# Patient Record
Sex: Female | Born: 1965
Health system: Southern US, Community
[De-identification: ages and names within clinical notes are randomized; demographics above are authoritative.]

## PROBLEM LIST (undated history)

## (undated) DIAGNOSIS — F419 Anxiety disorder, unspecified: Secondary | ICD-10-CM

## (undated) DIAGNOSIS — G4733 Obstructive sleep apnea (adult) (pediatric): Secondary | ICD-10-CM

## (undated) DIAGNOSIS — Z87440 Personal history of urinary (tract) infections: Secondary | ICD-10-CM

## (undated) DIAGNOSIS — G43909 Migraine, unspecified, not intractable, without status migrainosus: Secondary | ICD-10-CM

## (undated) DIAGNOSIS — Z803 Family history of malignant neoplasm of breast: Secondary | ICD-10-CM

## (undated) DIAGNOSIS — R109 Unspecified abdominal pain: Secondary | ICD-10-CM

## (undated) DIAGNOSIS — M1990 Unspecified osteoarthritis, unspecified site: Secondary | ICD-10-CM

## (undated) DIAGNOSIS — D229 Melanocytic nevi, unspecified: Secondary | ICD-10-CM

## (undated) DIAGNOSIS — K219 Gastro-esophageal reflux disease without esophagitis: Secondary | ICD-10-CM

## (undated) DIAGNOSIS — T4145XA Adverse effect of unspecified anesthetic, initial encounter: Secondary | ICD-10-CM

## (undated) DIAGNOSIS — Z85820 Personal history of malignant melanoma of skin: Secondary | ICD-10-CM

## (undated) DIAGNOSIS — Z85828 Personal history of other malignant neoplasm of skin: Secondary | ICD-10-CM

## (undated) DIAGNOSIS — Z8719 Personal history of other diseases of the digestive system: Secondary | ICD-10-CM

## (undated) DIAGNOSIS — F32A Depression, unspecified: Secondary | ICD-10-CM

## (undated) DIAGNOSIS — I10 Essential (primary) hypertension: Secondary | ICD-10-CM

## (undated) DIAGNOSIS — T8859XA Other complications of anesthesia, initial encounter: Secondary | ICD-10-CM

## (undated) DIAGNOSIS — E282 Polycystic ovarian syndrome: Secondary | ICD-10-CM

## (undated) DIAGNOSIS — D649 Anemia, unspecified: Secondary | ICD-10-CM

## (undated) DIAGNOSIS — C801 Malignant (primary) neoplasm, unspecified: Secondary | ICD-10-CM

## (undated) DIAGNOSIS — R0600 Dyspnea, unspecified: Secondary | ICD-10-CM

## (undated) DIAGNOSIS — K760 Fatty (change of) liver, not elsewhere classified: Secondary | ICD-10-CM

## (undated) DIAGNOSIS — T7840XA Allergy, unspecified, initial encounter: Secondary | ICD-10-CM

## (undated) HISTORY — PX: LAPAROSCOPIC REPAIR AND REMOVAL OF GASTRIC BAND: SHX5919

## (undated) HISTORY — PX: DILATION AND CURETTAGE OF UTERUS: SHX78

## (undated) HISTORY — DX: Anemia, unspecified: D64.9

## (undated) HISTORY — DX: Migraine, unspecified, not intractable, without status migrainosus: G43.909

## (undated) HISTORY — DX: Melanocytic nevi, unspecified: D22.9

## (undated) HISTORY — DX: Gastro-esophageal reflux disease without esophagitis: K21.9

## (undated) HISTORY — DX: Obstructive sleep apnea (adult) (pediatric): G47.33

## (undated) HISTORY — DX: Personal history of malignant melanoma of skin: Z85.820

## (undated) HISTORY — DX: Essential (primary) hypertension: I10

## (undated) HISTORY — DX: Allergy, unspecified, initial encounter: T78.40XA

## (undated) HISTORY — DX: Family history of malignant neoplasm of breast: Z80.3

## (undated) HISTORY — DX: Personal history of other malignant neoplasm of skin: Z85.828

## (undated) HISTORY — DX: Malignant (primary) neoplasm, unspecified: C80.1

---

## 1989-09-21 DIAGNOSIS — T8859XA Other complications of anesthesia, initial encounter: Secondary | ICD-10-CM

## 1989-09-21 HISTORY — PX: CHOLECYSTECTOMY: SHX55

## 1989-09-21 HISTORY — DX: Other complications of anesthesia, initial encounter: T88.59XA

## 1993-09-21 DIAGNOSIS — F410 Panic disorder [episodic paroxysmal anxiety] without agoraphobia: Secondary | ICD-10-CM

## 1993-09-21 HISTORY — DX: Panic disorder (episodic paroxysmal anxiety): F41.0

## 2001-12-14 ENCOUNTER — Other Ambulatory Visit: Admission: RE | Admit: 2001-12-14 | Discharge: 2001-12-14 | Payer: Self-pay | Admitting: Unknown Physician Specialty

## 2007-03-09 ENCOUNTER — Ambulatory Visit: Payer: Self-pay | Admitting: Cardiology

## 2007-04-25 ENCOUNTER — Ambulatory Visit: Payer: Self-pay | Admitting: Physician Assistant

## 2009-07-04 ENCOUNTER — Encounter: Admission: RE | Admit: 2009-07-04 | Discharge: 2009-07-04 | Payer: Self-pay | Admitting: Internal Medicine

## 2011-02-03 NOTE — Assessment & Plan Note (Signed)
[facility]                          [facility] OFFICE NOTE   NAME:Kimberly Ortega        MRN:          [id number]  DATE:04/25/2007                            DOB:          1965-11-13    PRIMARY CARE PHYSICIAN:  Dr. Doreen Beam.   PRIMARY CARDIOLOGIST:  Dr. Lewayne Bunting.   HISTORY OF PRESENT ILLNESS:  Kimberly Ortega is a 45 year old female  patient who returns to the office today for post hospitalization  followup.  She saw Dr. Andee Lineman on March 10, 2007 secondary to chest pain  and evaluation of bigeminy.  The patient ruled out for myocardial  infarction with enzymes.  She underwent a routine exercise treadmill  test.  She had no evidence of ischemia.  She did have a vagal episode  with associated hypotension, this was felt to be benign.  She also had  an echocardiogram that revealed an EF of 55% - 60%.  No aortic stenosis.  No mitral regurgitation.  She did wear a CardioNet monitor for 21 days.  This returned revealing sinus rhythm, sinus bradycardia, an occasional  episode of sinus tachycardia with a lot of PVCs, bigeminy and trigeminy.   Today she notes she is doing much better.  She is having less chest pain  and palpitations.  We went over her monitor and testing extensively.  She had many questions regarding what could have caused her chest pain.  She does note that her chest pain is improved quite a bit.  Again, it is  nonexertional.  There is really no associated shortness of breath.  She  is able to walk on an elliptical machine without any symptoms.  The day  she went into the hospital she did note some arm pain and jaw pain.  These symptoms are usually followed by a panic attack.  She did note a  lot of indigestion recently.  She was placed on Protonix after discharge  from the hospital.  She was also noted to have microcytic anemia and is  now on iron therapy.  She does note a symptom of transient shooting leg  pain that  is not related to exertion.  She denies any symptoms of  restlessness.   MEDICATIONS:  1. Alprazolam 0.5 mg daily.  2. Protonix 40 mg daily.  3. Poly iron 150 mg b.i.d.  4. Aspirin p.r.n.   PHYSICAL EXAMINATION:  She is a well-nourished, well-developed female in  no acute distress.  Blood pressure is 125/80, pulse 68, weight 246.4  pounds.  HEENT:  Normal.  NECK:  Without JVD.  CARDIAC:  Normal S1-S2, regular rate and rhythm.  LUNGS:  Clear to auscultation bilaterally.  ABDOMEN:  Soft, nontender.  EXTREMITIES:  Trace edema bilaterally.  Calves soft, nontender.  SKIN:  Warm and dry.  NEUROLOGIC:  She is alert and oriented x3, cranial nerves II-XII grossly  intact.  Femoral artery pulses are difficult to palpate bilaterally, no bruits  noted.  Dorsalis pedis and posterior tibialis pulses are 2+ bilaterally.   Electrocardiogram reveals sinus bradycardia with a heart rate of 56, no  acute changes.   IMPRESSION:  1. Palpitations.      a.  Suspect symptomatic premature ventricular contractions.      b.     Premature ventricular contractions, ventricular bigeminy and       ventricular trigeminy noted on recent event monitor.  2. Good left ventricular function.  3. Nonischemic routine exercise treadmill test March 10, 2007.  4. Panic disorder.  5. Gastroesophageal reflux disease.  6. Microcytic anemia.      a.     Suspect secondary to menorrhagia.      b.     Followed by primary care physician.  7. Polycystic ovarian disease, followed by gynecology.  8. Noncardiac chest pain.   PLAN:  Patient presents to the office today for post hospitalization  followup.  Overall she is doing well.  Her symptoms of chest pain seem  to be noncardiac.  I question whether or not they may have been related  to gastroesophageal reflux disease.  Her symptoms have improved on  Protonix therapy.  Her palpitations are fairly quiescent at this time.  We discussed briefly the possibility of going on  beta blocker therapy on  a p.r.n. basis.  She prefers to avoid this if at all possible.  I think  with her blood pressure being normotensive and her heart rate being in  the bradycardic range, this would be best.  She did note some transient  leg pain.  This seems to be nonspecific.  Her pulses are good.  She can  follow up with Dr. Sherril Croon for this.  She can follow up in our office on a  p.r.n. basis.      Tereso Newcomer, PA-C  Electronically Signed      Learta Codding, MD,FACC  Electronically Signed   SW/MedQ  DD: 04/25/2007  DT: 04/25/2007  Job #: [id number]   cc:   Doreen Beam

## 2011-06-18 ENCOUNTER — Encounter: Payer: PRIVATE HEALTH INSURANCE | Attending: Surgery | Admitting: *Deleted

## 2011-06-18 DIAGNOSIS — Z09 Encounter for follow-up examination after completed treatment for conditions other than malignant neoplasm: Secondary | ICD-10-CM | POA: Insufficient documentation

## 2011-06-18 DIAGNOSIS — Z713 Dietary counseling and surveillance: Secondary | ICD-10-CM | POA: Insufficient documentation

## 2011-06-18 DIAGNOSIS — Z9884 Bariatric surgery status: Secondary | ICD-10-CM | POA: Insufficient documentation

## 2011-06-18 NOTE — Progress Notes (Signed)
  Pre-Operative Nutrition Class  Patient was seen on 06/18/2011 from 5:00pm to 6:00pm for Pre-Operative Nutrition education at [facility].   Surgery date: 07/07/11 Surgery type: LAGB  Weight today: 271.5 lbs Weight change: 28.1 lbs gain BMI: 43.9%  Samples given per MNT protocol: Bariatric Advantage Multivitamin Lot # [id number] Exp: 12/12  Bariatric Advantage Calcium Citrate Lot # [id number] Exp: 12/13  Celebrate Vitamins Multivitamin Lot # [id number] Exp: 9/13  Celebrate VitaminsCalcium Citrate Lot # [id number] Exp: 4/13  Unjury Protein Lot # [id number] Exp:1/14  The following the learning objective met by the patient during this course:   Identifies Pre-Op Dietary Goals and will begin 2 weeks pre-operatively   Identifies appropriate sources of fluids and proteins   States protein recommendations and appropriate sources pre and post-operatively  Identifies Post-Operative Dietary Goals and will follow for 2 weeks post-operatively  Identifies appropriate multivitamin and calcium sources  Describes the need for physical activity post-operatively and will follow MD recommendations  States when to call healthcare provider regarding medication questions or post-operative complications  Handouts given during class include:  Pre-Op Bariatric Surgery Diet Handout  Protein Shake Handout  Post-Op Bariatric Surgery Nutrition Handout  BELT Program Information Flyer  Support Group Information Flyer  Follow-Up Plan: Patient will follow-up at [facility] 2 weeks post operatively for diet advancement per MD.

## 2011-06-18 NOTE — Patient Instructions (Signed)
Follow:    Pre-Op Diet per MD 2 weeks prior to surgery  Phase 2- Liquids (clear/full) 2 weeks after surgery  Vitamin/Mineral/Calcium guidelines for purchasing bariatric supplements  Exercise guidelines pre and post-op per MD  Follow-up at [facility] in 2 weeks post-op for diet advancement. Contact Keltie Labell as needed with questions/concerns.  

## 2011-07-02 ENCOUNTER — Encounter (INDEPENDENT_AMBULATORY_CARE_PROVIDER_SITE_OTHER): Payer: Self-pay | Admitting: Surgery

## 2011-07-02 ENCOUNTER — Ambulatory Visit (INDEPENDENT_AMBULATORY_CARE_PROVIDER_SITE_OTHER): Payer: PRIVATE HEALTH INSURANCE | Admitting: Surgery

## 2011-07-02 VITALS — BP 144/90 | HR 62 | Temp 96.9°F | Resp 14 | Ht 66.75 in | Wt 262.2 lb

## 2011-07-02 NOTE — Progress Notes (Signed)
Subjective:     Patient ID: Kimberly Ortega, female   DOB: [birth year], 45 y.o.   MRN: [id number]  HPI Kimberly Ortega and her husband came in today for a preoperative visit for a LAP-BAND placement this coming Tuesday. Kimberly Ortega is 54 sutures old and has been through our program seeing me initially January 2011. Her comorbidities included hypertension, obstructive sleep apnea, and mild degenerative joint disease. She does have significant gastroesophageal reflux and had an upper GI at [facility] and has an endoscopy which have looked at the pictures showing a small hiatal hernia. I discussed repair with her.  Her husband is a Teacher, early years/pre and I wrote her scripts for her Roxicet and Zofran ODT 4 mg since they will fill them in [place].   Past history she has no history of DVT. Current Outpatient Prescriptions  Medication Sig Dispense Refill  . ALPRAZolam (XANAX) 1 MG tablet Take 1 mg by mouth at bedtime as needed.        Marland Kitchen omeprazole (PRILOSEC) 20 MG capsule Take 20 mg by mouth daily.         Past Medical History  Diagnosis Date  . GERD (gastroesophageal reflux disease)   . Sleep apnea   . Anemia   . Cancer     melanoma  . Generalized headaches   . Family history of breast cancer     cousins on both sides of family   Past Surgical History  Procedure Date  . Cholecystectomy 1991   Allergies as of 07/02/2011  . (No Known Allergies)  I have discussed lab and surgery with her. She is aware of the risk and potential complications. I've discussed hiatal hernia repair with him and she understands how this is done.   Review of Systems  Constitutional: Negative.   HENT: Negative.   Eyes: Negative.   Respiratory: Negative.   Cardiovascular: Negative.   Gastrointestinal: Negative.   Genitourinary: Negative.   Musculoskeletal: Positive for myalgias.       Occasional thigh pain  Skin: Negative.   Psychiatric/Behavioral:       Panic disorder    Filed  Vitals:   07/02/11 0947  BP: 144/90  Pulse: 62  Temp: 96.9 F (36.1 C)  Resp: 14      Objective:   Physical Exam  Constitutional: She is oriented to person, place, and time. She appears well-developed and well-nourished.  HENT:  Head: Normocephalic and atraumatic.  Eyes: Pupils are equal, round, and reactive to light.  Neck: Normal range of motion. Neck supple.  Cardiovascular: Normal rate, regular rhythm and normal heart sounds.   Pulmonary/Chest: Effort normal and breath sounds normal.  Abdominal: Soft. Bowel sounds are normal.  Musculoskeletal: Normal range of motion.  Neurological: She is alert and oriented to person, place, and time.  Skin: Skin is warm and dry.  Psychiatric: She has a normal mood and affect. Her behavior is normal. Judgment and thought content normal.       Assessment      Morbid obesity, BMI 42 with GERD    Plan:     Lapband and hiatus hernia repair

## 2011-07-03 ENCOUNTER — Other Ambulatory Visit (INDEPENDENT_AMBULATORY_CARE_PROVIDER_SITE_OTHER): Payer: Self-pay | Admitting: Surgery

## 2011-07-03 ENCOUNTER — Ambulatory Visit (HOSPITAL_COMMUNITY)
Admission: RE | Admit: 2011-07-03 | Discharge: 2011-07-03 | Disposition: A | Discharge status: Home / Self Care | Payer: PRIVATE HEALTH INSURANCE | Source: Ambulatory Visit | Attending: Surgery | Admitting: Surgery

## 2011-07-03 ENCOUNTER — Other Ambulatory Visit: Payer: Self-pay | Admitting: Anesthesiology

## 2011-07-03 ENCOUNTER — Encounter (HOSPITAL_COMMUNITY): Payer: PRIVATE HEALTH INSURANCE

## 2011-07-03 DIAGNOSIS — G4730 Sleep apnea, unspecified: Secondary | ICD-10-CM | POA: Insufficient documentation

## 2011-07-03 DIAGNOSIS — Z01811 Encounter for preprocedural respiratory examination: Secondary | ICD-10-CM

## 2011-07-03 DIAGNOSIS — D649 Anemia, unspecified: Secondary | ICD-10-CM | POA: Insufficient documentation

## 2011-07-03 DIAGNOSIS — Z01818 Encounter for other preprocedural examination: Secondary | ICD-10-CM | POA: Insufficient documentation

## 2011-07-03 DIAGNOSIS — Z01812 Encounter for preprocedural laboratory examination: Secondary | ICD-10-CM | POA: Insufficient documentation

## 2011-07-03 DIAGNOSIS — Z01810 Encounter for preprocedural cardiovascular examination: Secondary | ICD-10-CM | POA: Insufficient documentation

## 2011-07-03 LAB — DIFFERENTIAL
Basophils Absolute: 0.1 10*3/uL (ref 0.0–0.1)
Eosinophils Absolute: 0.2 10*3/uL (ref 0.0–0.7)
Lymphocytes Relative: 26 % (ref 12–46)
Monocytes Absolute: 0.4 10*3/uL (ref 0.1–1.0)
Neutro Abs: 5.1 10*3/uL (ref 1.7–7.7)
Neutrophils Relative %: 66 % (ref 43–77)

## 2011-07-03 LAB — COMPREHENSIVE METABOLIC PANEL
BUN: 16 mg/dL (ref 6–23)
CO2: 28 mEq/L (ref 19–32)
Chloride: 100 mEq/L (ref 96–112)
Creatinine, Ser: 0.8 mg/dL (ref 0.50–1.10)
GFR calc non Af Amer: 87 mL/min — ABNORMAL LOW (ref >90)
Total Bilirubin: 0.1 mg/dL — ABNORMAL LOW (ref 0.3–1.2)

## 2011-07-03 LAB — CBC
HCT: 31.5 % — ABNORMAL LOW (ref 36.0–46.0)
Hemoglobin: 9.3 g/dL — ABNORMAL LOW (ref 12.0–15.0)
MCH: 20.1 pg — ABNORMAL LOW (ref 26.0–34.0)
MCV: 68 fL — ABNORMAL LOW (ref 78.0–100.0)
RBC: 4.63 MIL/uL (ref 3.87–5.11)

## 2011-07-03 LAB — SURGICAL PCR SCREEN
MRSA, PCR: NEGATIVE
Staphylococcus aureus: POSITIVE — AB

## 2011-07-07 ENCOUNTER — Ambulatory Visit (HOSPITAL_COMMUNITY)
Admission: RE | Admit: 2011-07-07 | Discharge: 2011-07-08 | Disposition: A | Discharge status: Home / Self Care | Payer: PRIVATE HEALTH INSURANCE | Source: Ambulatory Visit | Attending: Surgery | Admitting: Surgery

## 2011-07-07 DIAGNOSIS — Z79899 Other long term (current) drug therapy: Secondary | ICD-10-CM | POA: Insufficient documentation

## 2011-07-07 DIAGNOSIS — Z6841 Body Mass Index (BMI) 40.0 and over, adult: Secondary | ICD-10-CM | POA: Insufficient documentation

## 2011-07-07 DIAGNOSIS — K449 Diaphragmatic hernia without obstruction or gangrene: Secondary | ICD-10-CM | POA: Insufficient documentation

## 2011-07-07 DIAGNOSIS — G4733 Obstructive sleep apnea (adult) (pediatric): Secondary | ICD-10-CM | POA: Insufficient documentation

## 2011-07-07 DIAGNOSIS — K210 Gastro-esophageal reflux disease with esophagitis: Secondary | ICD-10-CM

## 2011-07-07 DIAGNOSIS — K219 Gastro-esophageal reflux disease without esophagitis: Secondary | ICD-10-CM | POA: Insufficient documentation

## 2011-07-07 HISTORY — PX: LAPAROSCOPIC GASTRIC BANDING: SHX1100

## 2011-07-08 ENCOUNTER — Ambulatory Visit (HOSPITAL_COMMUNITY): Payer: PRIVATE HEALTH INSURANCE

## 2011-07-08 LAB — DIFFERENTIAL
Basophils Relative: 1 % (ref 0–1)
Eosinophils Relative: 1 % (ref 0–5)
Monocytes Relative: 7 % (ref 3–12)
Neutrophils Relative %: 67 % (ref 43–77)

## 2011-07-08 LAB — CBC
HCT: 29.1 % — ABNORMAL LOW (ref 36.0–46.0)
Hemoglobin: 8.4 g/dL — ABNORMAL LOW (ref 12.0–15.0)
MCV: 69.5 fL — ABNORMAL LOW (ref 78.0–100.0)
WBC: 10.9 10*3/uL — ABNORMAL HIGH (ref 4.0–10.5)

## 2011-07-08 MED ORDER — IOHEXOL 300 MG/ML  SOLN
20.0000 mL | Freq: Once | INTRAMUSCULAR | Status: AC | PRN
  Administered 2011-07-08: 20 mL via ORAL

## 2011-07-15 NOTE — Op Note (Signed)
  NAME:  Kimberly Ortega, Kimberly Ortega NO.:  [id number]  MEDICAL RECORD NO.:  [id number]  LOCATION:  1524                         FACILITY:  [facility]  PHYSICIAN:  Thornton Park. Daphine Deutscher, MD  DATE OF BIRTH:  [birth year]  DATE OF PROCEDURE:  07/07/2011 DATE OF DISCHARGE:                              OPERATIVE REPORT   PREOPERATIVE DIAGNOSIS:  BMI 41 with gastroesophageal reflux disease.  PROCEDURE:  Laparoscopic adjustable gastric banding APS with 2-suture posterior hiatal hernia repair following balloon calibration, which was positive; APS band plicated using a SuperStitch on the left lateral side followed by 3 regular stitches, followed by an anti-slip stitch.  SURGEON:  Thornton Park. Daphine Deutscher, MD  ASSISTANT:  Lodema Pilot, MD  ANESTHESIA:  General endotracheal.  DESCRIPTION OF PROCEDURE:  This 45 year old white female from [place], [place], comes down for lap band.  She was taken to room 1 on Tuesday, July 07, 2011, and given general anesthesia.  The abdomen was prepped with PCMX and draped sterilely.  Access to the abdomen was achieved through the left upper quadrant using a 0 degree 10 mm to 11 mm 0 Optiview technique.  This was done without difficulty and the abdomen was insufflated.  5 was placed laterally, another 10-11 was placed to the left of the umbilicus for the camera, and then a 15 and another 10- 11 were placed on the right side.  5 mm used in the upper midline for the [facility] retractor, which provided visualization of the foregut. First, we did a balloon test calibration, which was positive with 10 cc of air.  We knew, she had a hiatal hernia by endoscopy up in [place].  I then dissected her posteriorly, elevating the esophagus finding the left crus and then putting 2 simple sutures held in place with tie knots posteriorly.  The balloon test was then negative and held under abdomen.  I then created a trajectory with above the band passer, which went well. An APS  band was chosen and introduced and brought around.  It was engaged and buckled.  It was held down and then plicated using the SuperStitch laterally with a very lateral area tacked to the left crus and then 3 sutures plicating over to the proximal stomach and then in any slip stitch medially.  Picture was taken of the band.  The tubing was brought out through the lower port on the right.  It was attached to the port, which had mesh placed on the back and it was implanted in the subcutaneous location.  Wounds were irrigated and injected with Marcaine and closed with 4-0 Vicryl, benzoin and Steri-Strips.  The patient tolerated the procedure well and was taken to recovery room in satisfactory condition.     Thornton Park Daphine Deutscher, MD     MBM/MEDQ  D:  07/07/2011  T:  07/08/2011  Job:  [id number]  cc:   Dr. Sherril Croon  Electronically Signed by [name] MD on 07/15/2011 05:00:31 PM

## 2011-07-15 NOTE — Discharge Summary (Signed)
  NAME:  Kimberly Ortega, TOTTY NO.:  [id number]  MEDICAL RECORD NO.:  [id number]  LOCATION:  1524                         FACILITY:  [facility]  PHYSICIAN:  Thornton Park. Daphine Deutscher, MD  DATE OF BIRTH:  [birth year]  DATE OF ADMISSION:  07/07/2011 DATE OF DISCHARGE:  07/08/2011                              DISCHARGE SUMMARY   ADMITTING DIAGNOSES:  Hiatal hernia and morbid obesity.  PROCEDURES:  Laparoscopic gastric banding, Allergan APS system, with 2 suture posterior repair of her hiatal hernia.  COURSE IN THE HOSPITAL:  [name] had the above-mentioned procedure on the day of admission.  Postoperatively, she had a swallow X-rays and was tolerating a diet prior to discharge.  She already had a script for Roxicet and Zofran at home and she will be followed up in the office in 2 weeks.  CONDITION:  Good.     Thornton Park Daphine Deutscher, MD     MBM/MEDQ  D:  07/08/2011  T:  07/08/2011  Job:  [id number]  Electronically Signed by [name] MD on 07/15/2011 05:00:26 PM

## 2011-07-21 ENCOUNTER — Encounter: Payer: Self-pay | Admitting: *Deleted

## 2011-07-21 ENCOUNTER — Ambulatory Visit: Payer: PRIVATE HEALTH INSURANCE

## 2011-07-21 NOTE — Progress Notes (Deleted)
  Bariatric Class:  Appt start time: 1600 end time:  {Time; Appointment:21385}.  2 Week Post-Operative Nutrition Class  Patient was seen on 07/21/2011 for Post-Operative Nutrition education at the [facility].   Surgery date: 07/07/11  Surgery type: LAGB   Weight today: *** Weight change: *** Total weight lost: *** BMI: ***  Weight today: 271.5 lbs  Weight change: 28.1 lbs gain  BMI: 43.9%  The following the learning objective met the patient during this course:   Identifies Phase 3A (Soft, High Proteins) Dietary Goals and will begin from 2 weeks post-operatively to 2 months post-operatively   Identifies appropriate sources of fluids and proteins   States protein recommendations and appropriate sources post-operatively  Identifies the need for appropriate texture modifications, mastication, and bite sizes when consuming solids  Identifies appropriate multivitamin and calcium sources post-operatively  Describes the need for physical activity post-operatively and will follow MD recommendations  States when to call healthcare provider regarding medication questions or post-operative complications  Handouts given during class include:  Phase 3A: Soft, High Protein Diet Handout  Band Fill Guidelines Handout  Follow-Up Plan: Patient will follow-up at [facility] in 6 weeks for 2 months post-op nutrition visit for diet advancement per MD.

## 2011-07-23 ENCOUNTER — Encounter (INDEPENDENT_AMBULATORY_CARE_PROVIDER_SITE_OTHER): Payer: Self-pay | Admitting: Surgery

## 2011-07-23 ENCOUNTER — Ambulatory Visit (INDEPENDENT_AMBULATORY_CARE_PROVIDER_SITE_OTHER): Payer: PRIVATE HEALTH INSURANCE | Admitting: Surgery

## 2011-07-23 VITALS — BP 136/88 | HR 68 | Temp 97.6°F | Resp 16 | Ht 66.75 in | Wt 243.8 lb

## 2011-07-23 DIAGNOSIS — Z9884 Bariatric surgery status: Secondary | ICD-10-CM

## 2011-07-23 DIAGNOSIS — D649 Anemia, unspecified: Secondary | ICD-10-CM | POA: Insufficient documentation

## 2011-07-23 NOTE — Progress Notes (Signed)
Addended by: Latricia Heft on: 07/23/2011 11:08 AM   Modules accepted: Orders

## 2011-07-23 NOTE — Patient Instructions (Signed)
Call to check HG.  If low we will order iron infusion.

## 2011-07-23 NOTE — Progress Notes (Signed)
Ms Kimberly Ortega Returns today and she is 2 weeks out from her lap band A PL  With 2 suture closure of her hiatus. Her burning pain and reflux symptoms are gone but she still says she has a bad taste in her mouth. I think this is probably all the chemicals and stuff causing her to have irregularities. She doesn't have reflux. Her hemoglobin preoperatively and dropped to around 9.3 in and subsequently 0.4. She is complaining of being tired. I'm a check a CBC on her today it is down we may order an iron infusion. Otherwise see her back in 4 weeks for her first Panafil.

## 2011-07-27 ENCOUNTER — Other Ambulatory Visit (INDEPENDENT_AMBULATORY_CARE_PROVIDER_SITE_OTHER): Payer: Self-pay | Admitting: Surgery

## 2011-07-28 ENCOUNTER — Ambulatory Visit: Payer: PRIVATE HEALTH INSURANCE

## 2011-07-28 ENCOUNTER — Telehealth: Payer: Self-pay | Admitting: *Deleted

## 2011-07-28 LAB — CBC
MCH: 19.2 pg — ABNORMAL LOW (ref 26.0–34.0)
MCHC: 28 g/dL — ABNORMAL LOW (ref 30.0–36.0)
Platelets: 487 10*3/uL — ABNORMAL HIGH (ref 150–400)
RDW: 17.5 % — ABNORMAL HIGH (ref 11.5–15.5)

## 2011-07-28 NOTE — Telephone Encounter (Signed)
Kimberly Ortega called today to report that she would not be attending her 2-Week Post-Op Nutrition Class today due to feeling extremely fatigued and tired. She reports that she recently found out per Dr. Daphine Deutscher that her Hgb levels were extremely low and that she is very anemic. She ntoes that she is waiting to hear back on whether or not she will have an iron infusion.   She stated that Dr. Daphine Deutscher told her to begin eating soft proteins such as a hard boiled egg.  Emailed patient Phase 3A - Soft, High Protein Foods and reviewed handout with her. She is to call [facility] (in the next several days) once her energy level improves to follow up on her labs and dietary intake.  Reviewed Goals during phone call including:  Follow Phase 3A: Soft High Protein Phase  Eat 3-6 small meals/snacks, every 3-5 hrs  Increase lean protein foods to meet 60g goal  Increase fluid intake to 64oz +  Avoid drinking 15 minutes before, during and 30 minutes after eating  Aim for >30 min of physical activity daily per MD

## 2011-07-31 ENCOUNTER — Telehealth (INDEPENDENT_AMBULATORY_CARE_PROVIDER_SITE_OTHER): Payer: Self-pay | Admitting: General Surgery

## 2011-07-31 NOTE — Telephone Encounter (Signed)
Pt called in asking to speak with French Ana. Pt stated she had blood work done by [employer] on 07/27/11 and wanted to know the results. Pt stated her iron was low and to have an infusion. Pt stated she can be called back on [phone].

## 2011-07-31 NOTE — Telephone Encounter (Signed)
Contacted patient to advise her that Dr Donell Beers viewed her lab work 07/30/11 and stated she did not need to have a transfusion.

## 2011-08-10 ENCOUNTER — Encounter (INDEPENDENT_AMBULATORY_CARE_PROVIDER_SITE_OTHER): Payer: Self-pay

## 2011-08-26 ENCOUNTER — Encounter (INDEPENDENT_AMBULATORY_CARE_PROVIDER_SITE_OTHER): Payer: Self-pay | Admitting: Surgery

## 2011-08-26 NOTE — Progress Notes (Signed)
Quick Note:  When do we see her back in office ______

## 2011-09-02 ENCOUNTER — Encounter (INDEPENDENT_AMBULATORY_CARE_PROVIDER_SITE_OTHER): Payer: Self-pay | Admitting: Surgery

## 2011-09-02 ENCOUNTER — Ambulatory Visit (INDEPENDENT_AMBULATORY_CARE_PROVIDER_SITE_OTHER): Payer: PRIVATE HEALTH INSURANCE | Admitting: Surgery

## 2011-09-02 VITALS — BP 134/92 | HR 60 | Temp 98.0°F | Resp 18 | Ht 66.75 in | Wt 234.5 lb

## 2011-09-02 DIAGNOSIS — Z9884 Bariatric surgery status: Secondary | ICD-10-CM

## 2011-09-02 NOTE — Progress Notes (Signed)
Today's visit is 57 days postop and her weight is down by 27.4 pounds. She is feeling some restriction. I did I first fell today and added 0.5 cc to her band. I'll see her back in 6 weeks.

## 2011-09-02 NOTE — Patient Instructions (Signed)

## 2011-09-04 ENCOUNTER — Encounter (INDEPENDENT_AMBULATORY_CARE_PROVIDER_SITE_OTHER): Payer: PRIVATE HEALTH INSURANCE

## 2011-10-16 ENCOUNTER — Encounter (INDEPENDENT_AMBULATORY_CARE_PROVIDER_SITE_OTHER): Payer: PRIVATE HEALTH INSURANCE | Admitting: Surgery

## 2011-10-22 ENCOUNTER — Encounter (INDEPENDENT_AMBULATORY_CARE_PROVIDER_SITE_OTHER): Payer: Self-pay | Admitting: Surgery

## 2011-10-22 ENCOUNTER — Ambulatory Visit (INDEPENDENT_AMBULATORY_CARE_PROVIDER_SITE_OTHER): Payer: PRIVATE HEALTH INSURANCE | Admitting: Surgery

## 2011-10-22 VITALS — BP 122/84 | HR 72 | Temp 98.4°F | Resp 16 | Ht 66.75 in | Wt 230.2 lb

## 2011-10-22 DIAGNOSIS — Z9884 Bariatric surgery status: Secondary | ICD-10-CM

## 2011-10-22 NOTE — Progress Notes (Signed)
PREOPERATIVE DIAGNOSIS: BMI 41 with gastroesophageal reflux disease.  PROCEDURE: Laparoscopic adjustable gastric banding APS with 2-suture  posterior hiatal hernia repair following balloon calibration, which was  positive; APS band plicated using a SuperStitch on the left lateral side  followed by 3 regular stitches, followed by an anti-slip stitch.  SURGEON: Thornton Park. Daphine Deutscher, MD  ASSISTANT: Lodema Pilot, MD  Debria Garret Amos [name] Body mass index is 36.32 kg/(m^2).  Having regurgitation:  no  Nocturnal reflux?  no  Amount of fill  0.75  Doing well.  Losing weight.  Will see back in 4 weeks

## 2011-11-19 ENCOUNTER — Ambulatory Visit (INDEPENDENT_AMBULATORY_CARE_PROVIDER_SITE_OTHER): Payer: PRIVATE HEALTH INSURANCE | Admitting: Surgery

## 2011-11-19 DIAGNOSIS — Z9884 Bariatric surgery status: Secondary | ICD-10-CM

## 2011-11-19 NOTE — Progress Notes (Signed)
Kimberly Ortega There is no height or weight on file to calculate BMI.  Having regurgitation:  no  Nocturnal reflux?  no  Amount of fill  0.5  I did a dynamic fill.  Added 0.5 cc.  Still not to Green zone.  No GER since surgery.  No charge until I have gotten her in the [place] Zone

## 2012-01-01 ENCOUNTER — Ambulatory Visit (INDEPENDENT_AMBULATORY_CARE_PROVIDER_SITE_OTHER): Payer: PRIVATE HEALTH INSURANCE | Admitting: Surgery

## 2012-01-01 ENCOUNTER — Encounter (INDEPENDENT_AMBULATORY_CARE_PROVIDER_SITE_OTHER): Payer: Self-pay | Admitting: Surgery

## 2012-01-01 VITALS — BP 132/72 | Ht 63.75 in | Wt 225.2 lb

## 2012-01-01 DIAGNOSIS — Z9884 Bariatric surgery status: Secondary | ICD-10-CM

## 2012-01-01 NOTE — Progress Notes (Signed)
Kimberly Ortega Body mass index is 38.96 kg/(m^2).  Having regurgitation:  no  Nocturnal reflux?  no  Amount of fill  0.4

## 2012-02-04 ENCOUNTER — Encounter (INDEPENDENT_AMBULATORY_CARE_PROVIDER_SITE_OTHER): Payer: Self-pay | Admitting: Surgery

## 2012-02-04 ENCOUNTER — Telehealth (INDEPENDENT_AMBULATORY_CARE_PROVIDER_SITE_OTHER): Payer: Self-pay | Admitting: Surgery

## 2012-02-05 ENCOUNTER — Encounter (INDEPENDENT_AMBULATORY_CARE_PROVIDER_SITE_OTHER): Payer: PRIVATE HEALTH INSURANCE | Admitting: Surgery

## 2012-02-10 ENCOUNTER — Encounter (INDEPENDENT_AMBULATORY_CARE_PROVIDER_SITE_OTHER): Payer: PRIVATE HEALTH INSURANCE | Admitting: Surgery

## 2012-02-10 ENCOUNTER — Telehealth (INDEPENDENT_AMBULATORY_CARE_PROVIDER_SITE_OTHER): Payer: Self-pay | Admitting: General Surgery

## 2012-02-19 ENCOUNTER — Ambulatory Visit (INDEPENDENT_AMBULATORY_CARE_PROVIDER_SITE_OTHER): Payer: PRIVATE HEALTH INSURANCE | Admitting: Surgery

## 2012-02-19 ENCOUNTER — Encounter (INDEPENDENT_AMBULATORY_CARE_PROVIDER_SITE_OTHER): Payer: Self-pay | Admitting: Surgery

## 2012-02-19 VITALS — BP 126/78 | HR 70 | Temp 97.4°F | Resp 14 | Ht 66.75 in | Wt 225.2 lb

## 2012-02-19 DIAGNOSIS — Z9884 Bariatric surgery status: Secondary | ICD-10-CM

## 2012-02-19 NOTE — Progress Notes (Signed)
Kimberly Ortega Body mass index is 35.54 kg/(m^2).  Having regurgitation:  no  Nocturnal reflux?  no  Amount of fill  1 cc  Frustrated.  No GER.  Will see back in 6 weeks

## 2012-02-19 NOTE — Patient Instructions (Signed)

## 2012-02-24 ENCOUNTER — Encounter (INDEPENDENT_AMBULATORY_CARE_PROVIDER_SITE_OTHER): Payer: PRIVATE HEALTH INSURANCE | Admitting: Surgery

## 2012-03-25 ENCOUNTER — Encounter (INDEPENDENT_AMBULATORY_CARE_PROVIDER_SITE_OTHER): Payer: Self-pay | Admitting: Surgery

## 2012-03-25 ENCOUNTER — Ambulatory Visit (INDEPENDENT_AMBULATORY_CARE_PROVIDER_SITE_OTHER): Payer: PRIVATE HEALTH INSURANCE | Admitting: Surgery

## 2012-03-25 VITALS — BP 120/80 | Ht 66.75 in | Wt 218.6 lb

## 2012-03-25 DIAGNOSIS — Z9884 Bariatric surgery status: Secondary | ICD-10-CM

## 2012-03-25 NOTE — Progress Notes (Signed)
Kimberly Ortega Body mass index is 34.49 kg/(m^2).  Having regurgitation:  no  Nocturnal reflux?  no  Amount of fill  0.5  She is trying to get her scheduled straightforward daughter stance palpitations. She has some goals set and has lost down to 218 since her last flow back of the end of May. Today's weight is 218 and added at 0.5 cc to her band. Let us see her back in approximately 5 weeks. She says she is not quite in the green zone yet. I told her about getting her there.

## 2012-03-25 NOTE — Patient Instructions (Signed)

## 2012-05-03 ENCOUNTER — Encounter (INDEPENDENT_AMBULATORY_CARE_PROVIDER_SITE_OTHER): Payer: Self-pay | Admitting: Surgery

## 2012-05-03 ENCOUNTER — Ambulatory Visit (INDEPENDENT_AMBULATORY_CARE_PROVIDER_SITE_OTHER): Payer: PRIVATE HEALTH INSURANCE | Admitting: Surgery

## 2012-05-03 VITALS — BP 123/78 | HR 78 | Temp 98.8°F | Resp 16 | Ht 66.0 in | Wt 217.2 lb

## 2012-05-03 DIAGNOSIS — Z9884 Bariatric surgery status: Secondary | ICD-10-CM

## 2012-05-03 NOTE — Progress Notes (Signed)
Kimberly Ortega Body mass index is 35.06 kg/(m^2).  Having regurgitation:  no  Nocturnal reflux?  no  Amount of fill  +0.3  She is heading for [place] tomorrow.  Will see her back in 3 weeks to assess restriction.  If unable to reach restriction may need to check for leakage.

## 2012-05-25 ENCOUNTER — Encounter (INDEPENDENT_AMBULATORY_CARE_PROVIDER_SITE_OTHER): Payer: PRIVATE HEALTH INSURANCE | Admitting: Surgery

## 2012-05-26 ENCOUNTER — Encounter (INDEPENDENT_AMBULATORY_CARE_PROVIDER_SITE_OTHER): Payer: PRIVATE HEALTH INSURANCE | Admitting: Surgery

## 2012-05-30 ENCOUNTER — Ambulatory Visit (INDEPENDENT_AMBULATORY_CARE_PROVIDER_SITE_OTHER): Payer: PRIVATE HEALTH INSURANCE | Admitting: Surgery

## 2012-05-30 ENCOUNTER — Encounter (INDEPENDENT_AMBULATORY_CARE_PROVIDER_SITE_OTHER): Payer: Self-pay | Admitting: Surgery

## 2012-05-30 VITALS — BP 124/84 | HR 64 | Resp 12 | Ht 66.7 in | Wt 218.0 lb

## 2012-05-30 DIAGNOSIS — Z9884 Bariatric surgery status: Secondary | ICD-10-CM

## 2012-05-30 NOTE — Progress Notes (Signed)
Kimberly Ortega Body mass index is 34.45 kg/(m^2).  Having regurgitation:  no  Nocturnal reflux?  no  Amount of fill  0.5  Frustrated because of slow weight loss.  Encouraged to stay on low carbs and continue exercise.  Will see in 4 weeks.

## 2012-05-30 NOTE — Patient Instructions (Addendum)

## 2012-07-05 ENCOUNTER — Telehealth (INDEPENDENT_AMBULATORY_CARE_PROVIDER_SITE_OTHER): Payer: Self-pay | Admitting: General Surgery

## 2012-07-05 NOTE — Telephone Encounter (Signed)
LMOM letting pt know that I am having to reschedule her appt to see Dr. Daphine Deutscher from 10/18 to 10/24 at 10:30.  I also sent a reminder card in the mail.

## 2012-07-06 ENCOUNTER — Encounter (INDEPENDENT_AMBULATORY_CARE_PROVIDER_SITE_OTHER): Payer: PRIVATE HEALTH INSURANCE | Admitting: Surgery

## 2012-07-08 ENCOUNTER — Encounter (INDEPENDENT_AMBULATORY_CARE_PROVIDER_SITE_OTHER): Payer: PRIVATE HEALTH INSURANCE | Admitting: Surgery

## 2012-07-14 ENCOUNTER — Encounter (INDEPENDENT_AMBULATORY_CARE_PROVIDER_SITE_OTHER): Payer: PRIVATE HEALTH INSURANCE | Admitting: Surgery

## 2012-07-22 ENCOUNTER — Ambulatory Visit (INDEPENDENT_AMBULATORY_CARE_PROVIDER_SITE_OTHER): Payer: PRIVATE HEALTH INSURANCE | Admitting: Surgery

## 2012-07-22 ENCOUNTER — Encounter (INDEPENDENT_AMBULATORY_CARE_PROVIDER_SITE_OTHER): Payer: Self-pay | Admitting: Surgery

## 2012-07-22 VITALS — BP 122/78 | HR 64 | Temp 98.5°F | Resp 16 | Ht 66.75 in | Wt 209.2 lb

## 2012-07-22 DIAGNOSIS — Z9884 Bariatric surgery status: Secondary | ICD-10-CM

## 2012-07-22 NOTE — Progress Notes (Signed)
Kimberly Ortega 46 y.o.  Body mass index is 33.01 kg/(m^2).  Patient Active Problem List  Diagnosis  . Anemia  . Lapband APS with [facility] repair Oct 2012    No Known Allergies  Past Surgical History  Procedure Date  . Cholecystectomy 1991  . Laparoscopic gastric banding 07/07/11   VYAS,DHRUV B., MD 1. Lapband APS with [facility] repair Oct 2012     Doing well.  Has lost 8 lbs since last visit.  Added 0.5 cc today.  I think that she is nearing green zone with her APL band. Susy Frizzle B. Daphine Deutscher, MD, [facility] [phone] beeper [phone]  07/22/2012 3:22 PM  .

## 2012-07-22 NOTE — Patient Instructions (Signed)

## 2012-09-02 ENCOUNTER — Encounter (INDEPENDENT_AMBULATORY_CARE_PROVIDER_SITE_OTHER): Payer: PRIVATE HEALTH INSURANCE | Admitting: Surgery

## 2012-09-09 ENCOUNTER — Encounter (INDEPENDENT_AMBULATORY_CARE_PROVIDER_SITE_OTHER): Payer: Self-pay | Admitting: Surgery

## 2012-10-03 ENCOUNTER — Telehealth (INDEPENDENT_AMBULATORY_CARE_PROVIDER_SITE_OTHER): Payer: Self-pay | Admitting: General Surgery

## 2012-10-03 ENCOUNTER — Other Ambulatory Visit (INDEPENDENT_AMBULATORY_CARE_PROVIDER_SITE_OTHER): Payer: Self-pay

## 2012-10-03 DIAGNOSIS — R52 Pain, unspecified: Secondary | ICD-10-CM

## 2012-10-03 NOTE — Telephone Encounter (Signed)
CT scan set up for 10/04/12 @ 9:00 am with [facility]. Patient aware. Will pick up contrast at [facility].

## 2012-10-03 NOTE — Telephone Encounter (Signed)
Patient called in stating that she has been having some abdominal pain and constipation. Patient believes it is at the same site of hernia repair and thinks that maybe the hernia has "become undone" patient said she "has been having pain in her chest on the side not around her heart". Patient asked if she has been seen by her PCP at all, she said no. Patient stated she did not want to go to the emergency room or see another doctor where she lives because they are not familiar with the lap band. Patient said she would rather see Dr. Daphine Deutscher and has had the pain for a while, but has been busy preparing for a wedding. Patient given appointment to see Dr. Daphine Deutscher on 10/07/11 at 12:10. Advised patient that was the first available appointment to see him. Patient wanted to leave a message on Dr. Ermalene Searing voicemail because she had questions to ask him. I advised her I would send a page to him and ask him to call her back. She confirmed her call back number as [phone].

## 2012-10-03 NOTE — Telephone Encounter (Signed)
Pt called to inform Dr. Daphine Deutscher that she has been having chest discomfort between her breasts, and abdominal discomfort also, and back pain for several days, she continues to take protein shake without any vomiting, her last bowel movement was Saturday after taking Miralax for 5 days prior. I told her I would notify Dr. Daphine Deutscher After reading EPIC note I told her that the person she spoke with this am had routed a message re her concerns to Dr. Sheron Nightingale and Meagan/gy

## 2012-10-04 ENCOUNTER — Ambulatory Visit
Admission: RE | Admit: 2012-10-04 | Discharge: 2012-10-04 | Disposition: A | Discharge status: Home / Self Care | Payer: BC Managed Care – PPO | Source: Ambulatory Visit | Attending: Surgery | Admitting: Surgery

## 2012-10-04 DIAGNOSIS — R52 Pain, unspecified: Secondary | ICD-10-CM

## 2012-10-04 MED ORDER — IOHEXOL 300 MG/ML  SOLN
125.0000 mL | Freq: Once | INTRAMUSCULAR | Status: AC | PRN
  Administered 2012-10-04: 125 mL via INTRAVENOUS

## 2012-10-05 ENCOUNTER — Telehealth (INDEPENDENT_AMBULATORY_CARE_PROVIDER_SITE_OTHER): Payer: Self-pay

## 2012-10-05 NOTE — Telephone Encounter (Signed)
Patient calling in for her 10/04/12 CT results.  Please call patient with results @ [phone].

## 2012-10-06 ENCOUNTER — Telehealth (INDEPENDENT_AMBULATORY_CARE_PROVIDER_SITE_OTHER): Payer: Self-pay

## 2012-10-06 ENCOUNTER — Encounter (INDEPENDENT_AMBULATORY_CARE_PROVIDER_SITE_OTHER): Payer: PRIVATE HEALTH INSURANCE | Admitting: Surgery

## 2012-10-06 NOTE — Telephone Encounter (Signed)
Returned pt's call regarding CT results.  I reviewed her results, and let her know that they showed no explanation for the pain she is experiencing.  She stated that she didn't think the tech scanned all through her chest, and that he possibly didn't "catch" her hernia site, which she states is where the pain is.  I told her I would talk with MM as soon as I could to figure out what to do from here.  I explained that he may be out of the office tomorrow, and if that is the case I will not be able to speak with him until Wed of next week.  She said she appreciated my call, and is looking at these results has a "good" thing.

## 2012-10-06 NOTE — Telephone Encounter (Signed)
Pt called to check on CT scan results done on Tuesday. I told he I would forward the request to Dr. Ermalene Searing assistant/gy

## 2012-10-07 ENCOUNTER — Telehealth (INDEPENDENT_AMBULATORY_CARE_PROVIDER_SITE_OTHER): Payer: Self-pay

## 2012-10-07 NOTE — Telephone Encounter (Signed)
LMOM for pt letting her know of her new appt date and time - Fri, Jan 31 @140p 

## 2012-10-21 ENCOUNTER — Ambulatory Visit (INDEPENDENT_AMBULATORY_CARE_PROVIDER_SITE_OTHER): Payer: BC Managed Care – PPO | Admitting: Surgery

## 2012-10-21 ENCOUNTER — Encounter (INDEPENDENT_AMBULATORY_CARE_PROVIDER_SITE_OTHER): Payer: Self-pay | Admitting: Surgery

## 2012-10-21 VITALS — BP 115/60 | HR 82 | Temp 98.6°F | Resp 12 | Ht 66.0 in | Wt 213.2 lb

## 2012-10-21 DIAGNOSIS — Z9884 Bariatric surgery status: Secondary | ICD-10-CM

## 2012-10-21 NOTE — Progress Notes (Signed)
Kimberly Ortega Body mass index is 34.41 kg/(m^2).  Having regurgitation:  no  Nocturnal reflux?  no  Amount of fill  .3 cc  I reviewed her CT scan which showed her to have a lot of left colon stool. I've encouraged her to start taking probiotics and also to use MiraLAX and/or enemas to help clear her left colon. She's gained the weight since the last visit I went ahead and added 0.3 cc to her pain. She was able to drink after that. I'll see her back in about 6 weeks.

## 2012-10-21 NOTE — Patient Instructions (Signed)

## 2012-11-10 ENCOUNTER — Other Ambulatory Visit (INDEPENDENT_AMBULATORY_CARE_PROVIDER_SITE_OTHER): Payer: Self-pay

## 2012-11-10 ENCOUNTER — Telehealth (INDEPENDENT_AMBULATORY_CARE_PROVIDER_SITE_OTHER): Payer: Self-pay | Admitting: *Deleted

## 2012-11-10 DIAGNOSIS — R4702 Dysphasia: Secondary | ICD-10-CM

## 2012-11-10 NOTE — Telephone Encounter (Signed)
Patient called to state that she is having a sore throat and discomfort in her epigastric region.  Patient unable to fill antacid medication until tomorrow so suggested she try an over the counter medication until she receives her prescription.  Encouraged patient that if she continues to have these issues once on the medication she may need to be evaluated for these new symptoms.

## 2012-11-17 ENCOUNTER — Telehealth (INDEPENDENT_AMBULATORY_CARE_PROVIDER_SITE_OTHER): Payer: Self-pay | Admitting: *Deleted

## 2012-11-17 ENCOUNTER — Ambulatory Visit
Admission: RE | Admit: 2012-11-17 | Discharge: 2012-11-17 | Disposition: A | Discharge status: Home / Self Care | Payer: BC Managed Care – PPO | Source: Ambulatory Visit | Attending: Surgery | Admitting: Surgery

## 2012-11-17 DIAGNOSIS — R4702 Dysphasia: Secondary | ICD-10-CM

## 2012-11-17 NOTE — Telephone Encounter (Signed)
Patient called to ask about DG Esophagus results since they told her "across the street" her results would be back in a hour.  Spoke to [employer] who stated that Daphine Deutscher MD does not want patient to have results until [facility] MD is able to review the case.  Tried to call patient back to let you know but no answer so message was left for her to call back.

## 2012-11-18 ENCOUNTER — Other Ambulatory Visit (HOSPITAL_COMMUNITY): Payer: Self-pay | Admitting: *Deleted

## 2012-11-18 ENCOUNTER — Encounter (HOSPITAL_COMMUNITY): Payer: Self-pay | Admitting: Pharmacy Technician

## 2012-11-18 ENCOUNTER — Encounter (HOSPITAL_COMMUNITY): Payer: Self-pay | Admitting: *Deleted

## 2012-11-18 ENCOUNTER — Other Ambulatory Visit (INDEPENDENT_AMBULATORY_CARE_PROVIDER_SITE_OTHER): Payer: Self-pay | Admitting: Surgery

## 2012-11-18 ENCOUNTER — Telehealth (INDEPENDENT_AMBULATORY_CARE_PROVIDER_SITE_OTHER): Payer: Self-pay | Admitting: Surgery

## 2012-11-18 NOTE — Telephone Encounter (Signed)
I spoke with Kimberly Ortega last evening after I had reviewed her swallow.  I discussed this with Dr. Ezzard Standing and after we had reviewed and discussed this by phone I decided to move toward endoscopy in the OR with possible lapband removal.  I spoke with Kimberly Ortega again this morning and I outlined a plan to add her on the schedule on Monday afternoon and plan endoscopy and possible laparoscopy and possible band removal .  I discussed that band removal would entail probable placement of a drain and a swallow study before restarting oral intake.  She is aware of the indications and indicated that she had read some things about band erosions on the internet.  I have instructed the office to add her on the schedule and EPIC orders have been entered.

## 2012-11-21 ENCOUNTER — Encounter (HOSPITAL_COMMUNITY): Payer: Self-pay | Admitting: Anesthesiology

## 2012-11-21 ENCOUNTER — Encounter (HOSPITAL_COMMUNITY): Payer: Self-pay | Admitting: *Deleted

## 2012-11-21 ENCOUNTER — Ambulatory Visit (HOSPITAL_COMMUNITY): Payer: BC Managed Care – PPO | Admitting: Anesthesiology

## 2012-11-21 ENCOUNTER — Encounter (INDEPENDENT_AMBULATORY_CARE_PROVIDER_SITE_OTHER): Payer: BC Managed Care – PPO | Admitting: Surgery

## 2012-11-21 ENCOUNTER — Inpatient Hospital Stay (HOSPITAL_COMMUNITY)
Admission: RE | Admit: 2012-11-21 | Discharge: 2012-11-24 | DRG: 477 | Disposition: A | Discharge status: Home / Self Care | Payer: BC Managed Care – PPO | Source: Ambulatory Visit | Attending: Surgery | Admitting: Surgery

## 2012-11-21 ENCOUNTER — Encounter (HOSPITAL_COMMUNITY)
Admission: RE | Disposition: A | Discharge status: Home / Self Care | Payer: Self-pay | Source: Ambulatory Visit | Attending: Surgery

## 2012-11-21 DIAGNOSIS — Z87440 Personal history of urinary (tract) infections: Secondary | ICD-10-CM

## 2012-11-21 DIAGNOSIS — Z9089 Acquired absence of other organs: Secondary | ICD-10-CM

## 2012-11-21 DIAGNOSIS — Z9884 Bariatric surgery status: Secondary | ICD-10-CM

## 2012-11-21 DIAGNOSIS — K219 Gastro-esophageal reflux disease without esophagitis: Secondary | ICD-10-CM | POA: Diagnosis present

## 2012-11-21 DIAGNOSIS — T82898A Other specified complication of vascular prosthetic devices, implants and grafts, initial encounter: Secondary | ICD-10-CM

## 2012-11-21 DIAGNOSIS — Y831 Surgical operation with implant of artificial internal device as the cause of abnormal reaction of the patient, or of later complication, without mention of misadventure at the time of the procedure: Secondary | ICD-10-CM | POA: Diagnosis present

## 2012-11-21 DIAGNOSIS — Y92009 Unspecified place in unspecified non-institutional (private) residence as the place of occurrence of the external cause: Secondary | ICD-10-CM

## 2012-11-21 DIAGNOSIS — Z803 Family history of malignant neoplasm of breast: Secondary | ICD-10-CM

## 2012-11-21 DIAGNOSIS — E282 Polycystic ovarian syndrome: Secondary | ICD-10-CM | POA: Diagnosis present

## 2012-11-21 DIAGNOSIS — M129 Arthropathy, unspecified: Secondary | ICD-10-CM | POA: Diagnosis present

## 2012-11-21 DIAGNOSIS — F411 Generalized anxiety disorder: Secondary | ICD-10-CM | POA: Diagnosis present

## 2012-11-21 DIAGNOSIS — Z79899 Other long term (current) drug therapy: Secondary | ICD-10-CM

## 2012-11-21 DIAGNOSIS — Z85820 Personal history of malignant melanoma of skin: Secondary | ICD-10-CM

## 2012-11-21 DIAGNOSIS — K9509 Other complications of gastric band procedure: Principal | ICD-10-CM

## 2012-11-21 DIAGNOSIS — D649 Anemia, unspecified: Secondary | ICD-10-CM | POA: Diagnosis present

## 2012-11-21 HISTORY — DX: Personal history of urinary (tract) infections: Z87.440

## 2012-11-21 HISTORY — DX: Unspecified abdominal pain: R10.9

## 2012-11-21 HISTORY — DX: Anxiety disorder, unspecified: F41.9

## 2012-11-21 HISTORY — PX: UPPER GI ENDOSCOPY: SHX6162

## 2012-11-21 HISTORY — DX: Polycystic ovarian syndrome: E28.2

## 2012-11-21 HISTORY — DX: Unspecified osteoarthritis, unspecified site: M19.90

## 2012-11-21 LAB — CREATININE, SERUM
GFR calc Af Amer: >90 mL/min (ref >90)
GFR calc non Af Amer: 83 mL/min — ABNORMAL LOW (ref >90)

## 2012-11-21 LAB — CBC
Hemoglobin: 10.4 g/dL — ABNORMAL LOW (ref 12.0–15.0)
MCH: 23.7 pg — ABNORMAL LOW (ref 26.0–34.0)
MCHC: 31.2 g/dL (ref 30.0–36.0)
MCV: 75.9 fL — ABNORMAL LOW (ref 78.0–100.0)
Platelets: 276 10*3/uL (ref 150–400)

## 2012-11-21 LAB — SURGICAL PCR SCREEN: MRSA, PCR: INVALID — AB

## 2012-11-21 SURGERY — ENDOSCOPY, UPPER GI TRACT
Anesthesia: General | Site: Abdomen | Wound class: Clean Contaminated

## 2012-11-21 MED ORDER — CHLORHEXIDINE GLUCONATE 0.12 % MT SOLN
15.0000 mL | Freq: Two times a day (BID) | OROMUCOSAL | Status: DC
  Administered 2012-11-22 – 2012-11-24 (×5): 15 mL via OROMUCOSAL
  Filled 2012-11-21 (×7): qty 15

## 2012-11-21 MED ORDER — BUPIVACAINE LIPOSOME 1.3 % IJ SUSP
20.0000 mL | INTRAMUSCULAR | Status: DC
  Filled 2012-11-21: qty 20

## 2012-11-21 MED ORDER — FENTANYL CITRATE 0.05 MG/ML IJ SOLN
INTRAMUSCULAR | Status: DC | PRN
  Administered 2012-11-21 (×5): 50 ug via INTRAVENOUS

## 2012-11-21 MED ORDER — ONDANSETRON HCL 4 MG/2ML IJ SOLN
INTRAMUSCULAR | Status: DC | PRN
  Administered 2012-11-21: 4 mg via INTRAVENOUS

## 2012-11-21 MED ORDER — PROPOFOL 10 MG/ML IV BOLUS
INTRAVENOUS | Status: DC | PRN
  Administered 2012-11-21: 200 mg via INTRAVENOUS

## 2012-11-21 MED ORDER — GLYCOPYRROLATE 0.2 MG/ML IJ SOLN
INTRAMUSCULAR | Status: DC | PRN
  Administered 2012-11-21: .6 mg via INTRAVENOUS

## 2012-11-21 MED ORDER — MIDAZOLAM HCL 2 MG/2ML IJ SOLN
INTRAMUSCULAR | Status: AC
  Filled 2012-11-21: qty 2

## 2012-11-21 MED ORDER — ROCURONIUM BROMIDE 100 MG/10ML IV SOLN
INTRAVENOUS | Status: DC | PRN
  Administered 2012-11-21: 30 mg via INTRAVENOUS
  Administered 2012-11-21 (×2): 10 mg via INTRAVENOUS

## 2012-11-21 MED ORDER — BUPIVACAINE HCL 0.5 % IJ SOLN
INTRAMUSCULAR | Status: AC
  Filled 2012-11-21: qty 1

## 2012-11-21 MED ORDER — HYDROMORPHONE HCL PF 1 MG/ML IJ SOLN
1.0000 mg | INTRAMUSCULAR | Status: DC | PRN
  Administered 2012-11-21 – 2012-11-22 (×2): 1 mg via INTRAVENOUS
  Filled 2012-11-21 (×3): qty 1

## 2012-11-21 MED ORDER — DEXTROSE 5 % IV SOLN
2.0000 g | Freq: Four times a day (QID) | INTRAVENOUS | Status: DC
  Administered 2012-11-21 – 2012-11-24 (×11): 2 g via INTRAVENOUS
  Filled 2012-11-21 (×17): qty 2

## 2012-11-21 MED ORDER — HYDROMORPHONE HCL PF 1 MG/ML IJ SOLN
0.2500 mg | INTRAMUSCULAR | Status: DC | PRN
  Administered 2012-11-21: 0.25 mg via INTRAVENOUS
  Administered 2012-11-21: 0.5 mg via INTRAVENOUS
  Administered 2012-11-21 (×2): 0.25 mg via INTRAVENOUS
  Administered 2012-11-21: 0.5 mg via INTRAVENOUS
  Administered 2012-11-21: 0.25 mg via INTRAVENOUS

## 2012-11-21 MED ORDER — MIDAZOLAM HCL 5 MG/5ML IJ SOLN
INTRAMUSCULAR | Status: DC | PRN
  Administered 2012-11-21 (×2): 1 mg via INTRAVENOUS

## 2012-11-21 MED ORDER — LACTATED RINGERS IV SOLN: INTRAVENOUS | Status: DC

## 2012-11-21 MED ORDER — HYDROMORPHONE HCL PF 1 MG/ML IJ SOLN
INTRAMUSCULAR | Status: AC
  Filled 2012-11-21: qty 1

## 2012-11-21 MED ORDER — MIDAZOLAM HCL 5 MG/ML IJ SOLN
1.0000 mg | Freq: Once | INTRAMUSCULAR | Status: AC
  Administered 2012-11-21: 2 mg via INTRAVENOUS

## 2012-11-21 MED ORDER — DEXAMETHASONE SODIUM PHOSPHATE 10 MG/ML IJ SOLN
INTRAMUSCULAR | Status: DC | PRN
  Administered 2012-11-21: 10 mg via INTRAVENOUS

## 2012-11-21 MED ORDER — LIDOCAINE HCL (CARDIAC) 20 MG/ML IV SOLN
INTRAVENOUS | Status: DC | PRN
  Administered 2012-11-21: 80 mg via INTRAVENOUS

## 2012-11-21 MED ORDER — HEPARIN SODIUM (PORCINE) 5000 UNIT/ML IJ SOLN
5000.0000 [IU] | Freq: Three times a day (TID) | INTRAMUSCULAR | Status: DC
  Administered 2012-11-21 – 2012-11-24 (×8): 5000 [IU] via SUBCUTANEOUS
  Filled 2012-11-21 (×11): qty 1

## 2012-11-21 MED ORDER — DEXTROSE 5 % IV SOLN
2.0000 g | INTRAVENOUS | Status: AC
  Administered 2012-11-21: 2 g via INTRAVENOUS
  Filled 2012-11-21: qty 2

## 2012-11-21 MED ORDER — CHLORHEXIDINE GLUCONATE 4 % EX LIQD
1.0000 "application " | Freq: Once | CUTANEOUS | Status: DC
  Filled 2012-11-21: qty 15

## 2012-11-21 MED ORDER — HEPARIN SODIUM (PORCINE) 5000 UNIT/ML IJ SOLN
5000.0000 [IU] | Freq: Once | INTRAMUSCULAR | Status: AC
  Administered 2012-11-21: 5000 [IU] via SUBCUTANEOUS
  Filled 2012-11-21: qty 1

## 2012-11-21 MED ORDER — ONDANSETRON HCL 4 MG/2ML IJ SOLN
4.0000 mg | Freq: Four times a day (QID) | INTRAMUSCULAR | Status: DC | PRN
  Administered 2012-11-22 – 2012-11-23 (×3): 4 mg via INTRAVENOUS
  Filled 2012-11-21 (×3): qty 2

## 2012-11-21 MED ORDER — LACTATED RINGERS IV SOLN
INTRAVENOUS | Status: DC
  Administered 2012-11-21: 1000 mL via INTRAVENOUS

## 2012-11-21 MED ORDER — KCL IN DEXTROSE-NACL 20-5-0.45 MEQ/L-%-% IV SOLN
INTRAVENOUS | Status: DC
  Administered 2012-11-21 – 2012-11-23 (×5): via INTRAVENOUS
  Administered 2012-11-24: 100 mL/h via INTRAVENOUS
  Filled 2012-11-21 (×8): qty 1000

## 2012-11-21 MED ORDER — BUPIVACAINE LIPOSOME 1.3 % IJ SUSP
INTRAMUSCULAR | Status: DC | PRN
  Administered 2012-11-21: 20 mL

## 2012-11-21 MED ORDER — SUCCINYLCHOLINE CHLORIDE 20 MG/ML IJ SOLN
INTRAMUSCULAR | Status: DC | PRN
  Administered 2012-11-21: 100 mg via INTRAVENOUS

## 2012-11-21 MED ORDER — MORPHINE SULFATE 2 MG/ML IJ SOLN: 1.0000 mg | INTRAMUSCULAR | Status: DC | PRN

## 2012-11-21 MED ORDER — MUPIROCIN 2 % EX OINT
TOPICAL_OINTMENT | Freq: Two times a day (BID) | CUTANEOUS | Status: DC
  Administered 2012-11-21: 14:00:00 via NASAL
  Filled 2012-11-21: qty 22

## 2012-11-21 MED ORDER — KCL IN DEXTROSE-NACL 20-5-0.45 MEQ/L-%-% IV SOLN
INTRAVENOUS | Status: AC
  Filled 2012-11-21: qty 1000

## 2012-11-21 MED ORDER — LACTATED RINGERS IR SOLN
Status: DC | PRN
  Administered 2012-11-21: 1000 mL

## 2012-11-21 MED ORDER — BIOTENE DRY MOUTH MT LIQD
15.0000 mL | Freq: Two times a day (BID) | OROMUCOSAL | Status: DC
Start: 2012-11-22 — End: 2012-11-24
  Administered 2012-11-22 – 2012-11-23 (×3): 15 mL via OROMUCOSAL

## 2012-11-21 MED ORDER — ONDANSETRON HCL 4 MG PO TABS: 4.0000 mg | ORAL_TABLET | Freq: Four times a day (QID) | ORAL | Status: DC | PRN

## 2012-11-21 MED ORDER — 0.9 % SODIUM CHLORIDE (POUR BTL) OPTIME
TOPICAL | Status: DC | PRN
  Administered 2012-11-21: 1000 mL

## 2012-11-21 MED ORDER — TISSEEL VH 10 ML EX KIT
PACK | CUTANEOUS | Status: AC
  Filled 2012-11-21: qty 1

## 2012-11-21 MED ORDER — NEOSTIGMINE METHYLSULFATE 1 MG/ML IJ SOLN
INTRAMUSCULAR | Status: DC | PRN
  Administered 2012-11-21: 4 mg via INTRAVENOUS

## 2012-11-21 SURGICAL SUPPLY — 50 items
ADH SKN CLS APL DERMABOND .7 (GAUZE/BANDAGES/DRESSINGS) ×1
APL SKNCLS STERI-STRIP NONHPOA (GAUZE/BANDAGES/DRESSINGS)
BENZOIN TINCTURE PRP APPL 2/3 (GAUZE/BANDAGES/DRESSINGS) IMPLANT
BLADE HEX COATED 2.75 (ELECTRODE) ×2 IMPLANT
CABLE HIGH FREQUENCY MONO STRZ (ELECTRODE) ×1 IMPLANT
CANISTER SUCTION 2500CC (MISCELLANEOUS) ×3 IMPLANT
CLOTH BEACON ORANGE TIMEOUT ST (SAFETY) ×2 IMPLANT
DECANTER SPIKE VIAL GLASS SM (MISCELLANEOUS) ×3 IMPLANT
DERMABOND ADVANCED (GAUZE/BANDAGES/DRESSINGS) ×1
DERMABOND ADVANCED .7 DNX12 (GAUZE/BANDAGES/DRESSINGS) IMPLANT
DRAIN CHANNEL 19F RND (DRAIN) ×1 IMPLANT
DRAPE LAPAROSCOPIC ABDOMINAL (DRAPES) ×1 IMPLANT
ELECT REM PT RETURN 9FT ADLT (ELECTROSURGICAL) ×2
ELECTRODE REM PT RTRN 9FT ADLT (ELECTROSURGICAL) ×1 IMPLANT
EVACUATOR DRAINAGE 10X20 100CC (DRAIN) IMPLANT
EVACUATOR SILICONE 100CC (DRAIN) ×2
GLOVE BIOGEL M 8.0 STRL (GLOVE) ×2 IMPLANT
GLOVE BIOGEL PI IND STRL 6.5 (GLOVE) IMPLANT
GLOVE BIOGEL PI INDICATOR 6.5 (GLOVE) ×1
GLOVE SURG SIGNA 7.5 PF LTX (GLOVE) ×1 IMPLANT
GLOVE SURG SS PI 6.5 STRL IVOR (GLOVE) ×1 IMPLANT
GLOVE SURG SS PI 8.5 STRL IVOR (GLOVE) ×1
GLOVE SURG SS PI 8.5 STRL STRW (GLOVE) IMPLANT
GOWN STRL NON-REIN LRG LVL3 (GOWN DISPOSABLE) ×3 IMPLANT
GOWN STRL REIN XL XLG (GOWN DISPOSABLE) ×5 IMPLANT
KIT BASIN OR (CUSTOM PROCEDURE TRAY) ×2 IMPLANT
NDL ACCESS PORT 2 (NEEDLE) IMPLANT
NEEDLE ACCESS PORT 2 (NEEDLE) ×2 IMPLANT
NEEDLE HYPO 22GX1.5 SAFETY (NEEDLE) ×2 IMPLANT
NS IRRIG 1000ML POUR BTL (IV SOLUTION) ×2 IMPLANT
PACK GENERAL/GYN (CUSTOM PROCEDURE TRAY) ×1 IMPLANT
SCISSORS LAP 5X35 DISP (ENDOMECHANICALS) ×1 IMPLANT
SET IRRIG TUBING LAPAROSCOPIC (IRRIGATION / IRRIGATOR) ×1 IMPLANT
SLEEVE XCEL OPT CAN 5 100 (ENDOMECHANICALS) ×2 IMPLANT
SPONGE DRAIN TRACH 4X4 STRL 2S (GAUZE/BANDAGES/DRESSINGS) ×2 IMPLANT
STAPLER VISISTAT 35W (STAPLE) ×1 IMPLANT
STRIP CLOSURE SKIN 1/2X4 (GAUZE/BANDAGES/DRESSINGS) IMPLANT
SUT ETHILON 2 0 PS N (SUTURE) ×1 IMPLANT
SUT PROLENE 2 0 CT2 30 (SUTURE) ×5 IMPLANT
SUT VIC AB 2-0 SH 27 (SUTURE)
SUT VIC AB 2-0 SH 27X BRD (SUTURE) IMPLANT
SUT VIC AB 4-0 SH 18 (SUTURE) ×2 IMPLANT
SYR 30ML LL (SYRINGE) ×2 IMPLANT
SYR BULB IRRIGATION 50ML (SYRINGE) ×1 IMPLANT
SYR CONTROL 10ML LL (SYRINGE) ×1 IMPLANT
SYRINGE 10CC LL (SYRINGE) ×2 IMPLANT
TAPE CLOTH SURG 4X10 WHT LF (GAUZE/BANDAGES/DRESSINGS) ×1 IMPLANT
TROCAR BLADELESS 15MM (ENDOMECHANICALS) ×1 IMPLANT
TROCAR BLADELESS OPT 5 100 (ENDOMECHANICALS) ×1 IMPLANT
TROCAR BLADELESS OPT 5 75 (ENDOMECHANICALS) ×1 IMPLANT

## 2012-11-21 NOTE — Op Note (Signed)
Surgeon: Wenda Low, MD, FACS  Asst:  Ovidio Kin, MD, FACS  Anes:  GET  Procedure: Upper endoscopy, laparoscopy, removal of lapband, placement of 19 blake drain  Diagnosis: Lapband erosion  Complications: none  EBL:   minimal cc  Description of Procedure:  The patient was taken to OR 1 and given general anesthesia.  Upper endoscopy was performed first and the scope was easily passed into the proximal esophagus. As I passed this down to the distal esophagus there was no evidence of reflux reflux esophagitis. The EG junction was at 40 and upon entering a past a whitish mucoid structure. I then retroflexed the scope in the stomach and saw about half of the lap band in the abdomen.  The stomach was decompressed and the scope withdrawn.  The abdomen was then prepped with PCMX and draped sterilely. 8 cc of saline was withdrawn from the band completely emptying it. Access the abdomen was achieved through the left upper quadrant using a 5 mm 0 scope without difficulty. The abdomen was insufflated and trocar placements were directed through prior incisions. The Nathanson retractor was inserted the liver was elevated. Where the band was felt to be was well walled off by the liver. We started off the case with all 5 mm instruments and initially put a 15 to the right upper quadrant and through which a removed the band pieces.  I began a dissection with scissors and identifying the tubing and then worked my way toward the buckle. This was done with the green laparoscopic scissors which are carried out eventually expose the buckle. I was able to easily unsnap this and I cut the buckle off and removed at first. I then removed the band easily and brought it out through the 15 mm port. The tubing lay in the abdomen and it was subsequently brought out explant the port. This was done by making a transverse incision with a lap band port was and then removing it in toto. A 19 Blake drain was placed over the area  the defect where the band was. There was no obvious gastric leakage. A 19 Blake drain was brought to the left upper quadrant port. All port sites were injected with Exparel to a total of 20 cc. Wounds were then closed 4-0 Vicryl and Dermabond. The patient  tolerated the procedure well and was taken recovery room in satisfactory condition.  [name], MD, [facility], [place] [phone]

## 2012-11-21 NOTE — Anesthesia Postprocedure Evaluation (Signed)
  Anesthesia Post-op Note  Patient: Kimberly Ortega  Procedure(s) Performed: Procedure(s) (LRB): UPPER GI ENDOSCOPY /OPERATIVE DIAGNOSTIC LAPAROSCOPY /REMOVAL LAP GASTRIC BAND/INSERTION OF DRAIN (N/A)  Patient Location: PACU  Anesthesia Type: General  Level of Consciousness: awake and alert   Airway and Oxygen Therapy: Patient Spontanous Breathing  Post-op Pain: mild  Post-op Assessment: Post-op Vital signs reviewed, Patient's Cardiovascular Status Stable, Respiratory Function Stable, Patent Airway and No signs of Nausea or vomiting  Last Vitals:  Filed Vitals:   11/21/12 1800  BP: 141/80  Pulse: 85  Temp: 36.9 C  Resp: 13    Post-op Vital Signs: stable   Complications: No apparent anesthesia complications

## 2012-11-21 NOTE — Anesthesia Preprocedure Evaluation (Addendum)
Anesthesia Evaluation  Patient identified by MRN, date of birth, ID band Patient awake    Reviewed: Allergy & Precautions, H&P , NPO status , Patient's Chart, lab work & pertinent test results  History of Anesthesia Complications (+) PONV  Airway Mallampati: II TM Distance: >3 FB Neck ROM: full    Dental no notable dental hx. (+) Teeth Intact and Dental Advisory Given   Pulmonary neg pulmonary ROS,  breath sounds clear to auscultation  Pulmonary exam normal       Cardiovascular Exercise Tolerance: Good negative cardio ROS  Rhythm:regular Rate:Normal     Neuro/Psych  Headaches, Anxiety negative neurological ROS  negative psych ROS   GI/Hepatic negative GI ROS, Neg liver ROS, GERD-  Medicated and Controlled,  Endo/Other  negative endocrine ROS  Renal/GU negative Renal ROS  negative genitourinary   Musculoskeletal   Abdominal   Peds  Hematology negative hematology ROS (+)   Anesthesia Other Findings   Reproductive/Obstetrics negative OB ROS                         Anesthesia Physical Anesthesia Plan  ASA: II  Anesthesia Plan: General   Post-op Pain Management:    Induction: Intravenous  Airway Management Planned: Oral ETT  Additional Equipment:   Intra-op Plan:   Post-operative Plan: Extubation in OR  Informed Consent: I have reviewed the patients History and Physical, chart, labs and discussed the procedure including the risks, benefits and alternatives for the proposed anesthesia with the patient or authorized representative who has indicated his/her understanding and acceptance.   Dental Advisory Given  Plan Discussed with: CRNA and Surgeon  Anesthesia Plan Comments:         Anesthesia Quick Evaluation

## 2012-11-21 NOTE — H&P (Signed)
Chief Complaint:  Pain in upper abdomen and abnormal UGI  History of Present Illness:  Kimberly Ortega is an 47 y.o. female who had an episode of pain in January evaluated by CT which was negative.  Recent dysphagia lead to UGI which is very suspicious for a band erosion.  Despite this she is minimally symtomatic.  Her port site is not infected.  Plan endoscopy and probably laparoscopy  Past Medical History  Diagnosis Date  . GERD (gastroesophageal reflux disease)   . Anemia   . Generalized headaches   . Family history of breast cancer     cousins on both sides of family  . Arthritis   . Anxiety   . Cancer     melanoma / basal cell  . History of recurrent UTIs   . Abdominal pain   . PONV (postoperative nausea and vomiting)     slow to wake up  . Polycystic ovarian disease     Past Surgical History  Procedure Laterality Date  . Cholecystectomy  1991  . Laparoscopic gastric banding  07/07/11  . Dilation and curettage of uterus      Current Facility-Administered Medications  Medication Dose Route Frequency Aldair Rickel Last Rate Last Dose  . bupivacaine liposome (EXPAREL) 1.3 % injection 266 mg  20 mL Infiltration To OR Valarie Merino, MD      . Melene Muller ON 11/22/2012] cefOXitin (MEFOXIN) 2 g in dextrose 5 % 50 mL IVPB  2 g Intravenous On Call to OR Valarie Merino, MD      . Melene Muller ON 11/22/2012] chlorhexidine (HIBICLENS) 4 % liquid 1 application  1 application Topical Once Valarie Merino, MD      . lactated ringers infusion   Intravenous Continuous Gaetano Hawthorne, MD 100 mL/hr at 11/21/12 1514 1,000 mL at 11/21/12 1514  . mupirocin ointment (BACTROBAN) 2 %   Nasal BID Valarie Merino, MD       Morphine and related Family History  Problem Relation Age of Onset  . Cancer Mother     cervical  . Stroke Mother   . Aneurysm Mother   . Cancer Sister     cervical  . Cancer Cousin     breast - both sides of family   Social History:   reports that she has never smoked. She has never  used smokeless tobacco. She reports that  drinks alcohol. She reports that she does not use illicit drugs.   REVIEW OF SYSTEMS - PERTINENT POSITIVES ONLY: noncontributory  Physical Exam:   Blood pressure 119/71, pulse 64, temperature 97.6 F (36.4 C), resp. rate 20, weight 210 lb 6 oz (95.425 kg), last menstrual period 11/11/2012, SpO2 100.00%. Body mass index is 33.97 kg/(m^2).  Gen:  WDWN WF NAD  Neurological: Alert and oriented to person, place, and time. Motor and sensory function is grossly intact  Head: Normocephalic and atraumatic.  Eyes: Conjunctivae are normal. Pupils are equal, round, and reactive to light. No scleral icterus.  Neck: Normal range of motion. Neck supple. No tracheal deviation or thyromegaly present.  Cardiovascular:  SR without murmurs or gallops.  No carotid bruits Respiratory: Effort normal.  No respiratory distress. No chest wall tenderness. Breath sounds normal.  No wheezes, rales or rhonchi.  Abdomen:  nontender port site GU: Musculoskeletal: Normal range of motion. Extremities are nontender. No cyanosis, edema or clubbing noted Lymphadenopathy: No cervical, preauricular, postauricular or axillary adenopathy is present Skin: Skin is warm and dry. No rash noted. No  diaphoresis. No erythema. No pallor. Pscyh: Normal mood and affect. Behavior is normal. Judgment and thought content normal.   LABORATORY RESULTS: Results for orders placed during the hospital encounter of 11/21/12 (from the past 48 hour(s))  SURGICAL PCR SCREEN     Status: Abnormal   Collection Time    11/21/12  1:37 PM      Result Value Range   MRSA, PCR INVALID RESULTS, SPECIMEN SENT FOR CULTURE (*) NEGATIVE   Comment: CALLED TO THOMPSON S ON [id number] AT 1550 BY INGLEE   Staphylococcus aureus INVALID RESULTS, SPECIMEN SENT FOR CULTURE (*) NEGATIVE   Comment: CALLED TO THOMPSON S ON [id number] AT 1550 BY INGLEE                The Xpert SA Assay (FDA     approved for NASAL specimens     in  patients over 45 years of age),     is one component of     a comprehensive surveillance     program.  Test performance has     been validated by [employer] for patients greater     than or equal to 40 year old.     It is not intended     to diagnose infection nor to     guide or monitor treatment.  HCG, SERUM, QUALITATIVE     Status: None   Collection Time    11/21/12  2:00 PM      Result Value Range   Preg, Serum NEGATIVE  NEGATIVE  CBC     Status: Abnormal   Collection Time    11/21/12  2:00 PM      Result Value Range   WBC 8.6  4.0 - 10.5 K/uL   RBC 4.39  3.87 - 5.11 MIL/uL   Hemoglobin 10.4 (*) 12.0 - 15.0 g/dL   HCT 08.6 (*) 57.8 - 46.9 %   MCV 75.9 (*) 78.0 - 100.0 fL   MCH 23.7 (*) 26.0 - 34.0 pg   MCHC 31.2  30.0 - 36.0 g/dL   RDW 62.9  52.8 - 41.3 %   Platelets 276  150 - 400 K/uL    RADIOLOGY RESULTS: No results found.  Problem List: Patient Active Problem List  Diagnosis  . Anemia  . Lapband APS with [facility] repair Oct 2012    Assessment & Plan: Worrisome for lapband erosion.  Will endoscope and plan possible laparoscopy    [name], MD, [facility] [phone] beeper [phone]  11/21/2012 3:53 PM

## 2012-11-21 NOTE — Transfer of Care (Signed)
Immediate Anesthesia Transfer of Care Note  Patient: Kimberly Ortega  Procedure(s) Performed: Procedure(s): UPPER GI ENDOSCOPY [name] DIAGNOSTIC LAPAROSCOPY /REMOVAL LAP GASTRIC BAND/INSERTION OF DRAIN (N/A)  Patient Location: PACU  Anesthesia Type:General  Level of Consciousness: awake, sedated and patient cooperative  Airway & Oxygen Therapy: Patient Spontanous Breathing and Patient connected to face mask oxygen  Post-op Assessment: Report given to PACU RN and Post -op Vital signs reviewed and stable  Post vital signs: Reviewed and stable  Complications: No apparent anesthesia complications

## 2012-11-21 NOTE — Preoperative (Signed)
Beta Blockers   Reason not to administer Beta Blockers:Not Applicable 

## 2012-11-22 ENCOUNTER — Encounter (HOSPITAL_COMMUNITY): Payer: Self-pay | Admitting: Surgery

## 2012-11-22 LAB — COMPREHENSIVE METABOLIC PANEL
BUN: 11 mg/dL (ref 6–23)
Calcium: 8.4 mg/dL (ref 8.4–10.5)
Creatinine, Ser: 0.79 mg/dL (ref 0.50–1.10)
GFR calc Af Amer: >90 mL/min (ref >90)
Glucose, Bld: 165 mg/dL — ABNORMAL HIGH (ref 70–99)
Total Protein: 6.4 g/dL (ref 6.0–8.3)

## 2012-11-22 LAB — CBC
HCT: 32.2 % — ABNORMAL LOW (ref 36.0–46.0)
Hemoglobin: 10.2 g/dL — ABNORMAL LOW (ref 12.0–15.0)
MCV: 75.6 fL — ABNORMAL LOW (ref 78.0–100.0)
RBC: 4.26 MIL/uL (ref 3.87–5.11)
WBC: 20.5 10*3/uL — ABNORMAL HIGH (ref 4.0–10.5)

## 2012-11-22 MED ORDER — ALPRAZOLAM 1 MG PO TABS
1.0000 mg | ORAL_TABLET | Freq: Two times a day (BID) | ORAL | Status: DC | PRN
  Administered 2012-11-22 – 2012-11-23 (×2): 1 mg via ORAL
  Filled 2012-11-22 (×2): qty 1

## 2012-11-22 MED ORDER — HYDROMORPHONE HCL PF 1 MG/ML IJ SOLN
0.5000 mg | INTRAMUSCULAR | Status: DC | PRN
  Administered 2012-11-22 – 2012-11-23 (×3): 0.5 mg via INTRAVENOUS
  Filled 2012-11-22 (×4): qty 1

## 2012-11-22 NOTE — Progress Notes (Signed)
Paged MD Daphine Deutscher regarding that patient request that she gets xanax for anxiety which she takes twice daily and something else for pain, orders received from MD Daphine Deutscher for 1mg  of xanax and dilaudid .5 mg q2h Stanford Breed RN 11-22-12 18:47pm

## 2012-11-22 NOTE — Progress Notes (Signed)
Patient ID: Kimberly Ortega, female   DOB: [birth year], 47 y.o.   MRN: [id number] [facility] Progress Note:   1 Day Post-Op  Subjective: Mental status is clear Objective: Vital signs in last 24 hours: Temp:  [97.6 F (36.4 C)-98.4 F (36.9 C)] 98.3 F (36.8 C) (03/04 1100) Pulse Rate:  [47-85] 47 (03/04 1100) Resp:  [10-20] 16 (03/04 1100) BP: (91-141)/(57-80) 91/57 mmHg (03/04 1100) SpO2:  [95 %-100 %] 100 % (03/04 1100) Weight:  [210 lb (95.255 kg)-210 lb 6 oz (95.425 kg)] 210 lb (95.255 kg) (03/03 2135)  Intake/Output from previous day: 03/03 0701 - 03/04 0700 In: 3026.7 [I.V.:2926.7; IV Piggyback:100] Out: 925 [Blood:50] Intake/Output this shift: Total I/O In: 450 [I.V.:400; IV Piggyback:50] Out: 50 [Drains:50]  Physical Exam: Work of breathing is normal.  Minimal abdominal pain.  JP is serosanguinous  Lab Results:  Results for orders placed during the hospital encounter of 11/21/12 (from the past 48 hour(s))  SURGICAL PCR SCREEN     Status: Abnormal   Collection Time    11/21/12  1:37 PM      Result Value Range   MRSA, PCR INVALID RESULTS, SPECIMEN SENT FOR CULTURE (*) NEGATIVE   Comment: CALLED TO THOMPSON S ON [id number] AT 1550 BY INGLEE   Staphylococcus aureus INVALID RESULTS, SPECIMEN SENT FOR CULTURE (*) NEGATIVE   Comment: CALLED TO THOMPSON S ON [id number] AT 1550 BY INGLEE                The Xpert SA Assay (FDA     approved for NASAL specimens     in patients over 39 years of age),     is one component of     a comprehensive surveillance     program.  Test performance has     been validated by [employer] for patients greater     than or equal to 87 year old.     It is not intended     to diagnose infection nor to     guide or monitor treatment.  HCG, SERUM, QUALITATIVE     Status: None   Collection Time    11/21/12  2:00 PM      Result Value Range   Preg, Serum NEGATIVE  NEGATIVE  CBC     Status: Abnormal   Collection Time    11/21/12  2:00  PM      Result Value Range   WBC 8.6  4.0 - 10.5 K/uL   RBC 4.39  3.87 - 5.11 MIL/uL   Hemoglobin 10.4 (*) 12.0 - 15.0 g/dL   HCT 95.6 (*) 21.3 - 08.6 %   MCV 75.9 (*) 78.0 - 100.0 fL   MCH 23.7 (*) 26.0 - 34.0 pg   MCHC 31.2  30.0 - 36.0 g/dL   RDW 57.8  46.9 - 62.9 %   Platelets 276  150 - 400 K/uL  CREATININE, SERUM     Status: Abnormal   Collection Time    11/21/12  8:57 PM      Result Value Range   Creatinine, Ser 0.83  0.50 - 1.10 mg/dL   GFR calc non Af Amer 83 (*) >90 mL/min   GFR calc Af Amer >90  >90 mL/min   Comment:            The eGFR has been calculated     using the CKD EPI equation.     This calculation has not been  validated in all clinical     situations.     eGFR's persistently     <90 mL/min signify     possible Chronic Kidney Disease.  CBC     Status: Abnormal   Collection Time    11/22/12  4:05 AM      Result Value Range   WBC 20.5 (*) 4.0 - 10.5 K/uL   RBC 4.26  3.87 - 5.11 MIL/uL   Hemoglobin 10.2 (*) 12.0 - 15.0 g/dL   HCT 16.1 (*) 09.6 - 04.5 %   MCV 75.6 (*) 78.0 - 100.0 fL   MCH 23.9 (*) 26.0 - 34.0 pg   MCHC 31.7  30.0 - 36.0 g/dL   RDW 40.9  81.1 - 91.4 %   Platelets 259  150 - 400 K/uL  COMPREHENSIVE METABOLIC PANEL     Status: Abnormal   Collection Time    11/22/12  4:05 AM      Result Value Range   Sodium 135  135 - 145 mEq/L   Potassium 4.3  3.5 - 5.1 mEq/L   Chloride 101  96 - 112 mEq/L   CO2 27  19 - 32 mEq/L   Glucose, Bld 165 (*) 70 - 99 mg/dL   BUN 11  6 - 23 mg/dL   Creatinine, Ser 7.82  0.50 - 1.10 mg/dL   Calcium 8.4  8.4 - 95.6 mg/dL   Total Protein 6.4  6.0 - 8.3 g/dL   Albumin 3.1 (*) 3.5 - 5.2 g/dL   AST 24  0 - 37 U/L   ALT 22  0 - 35 U/L   Alkaline Phosphatase 59  39 - 117 U/L   Total Bilirubin 0.5  0.3 - 1.2 mg/dL   GFR calc non Af Amer >90  >90 mL/min   GFR calc Af Amer >90  >90 mL/min   Comment:            The eGFR has been calculated     using the CKD EPI equation.     This calculation has not been      validated in all clinical     situations.     eGFR's persistently     <90 mL/min signify     possible Chronic Kidney Disease.    Radiology/Results: No results found.  Anti-infectives: Anti-infectives   Start     Dose/Rate Route Frequency Ordered Stop   11/22/12 0600  cefOXitin (MEFOXIN) 2 g in dextrose 5 % 50 mL IVPB     2 g 100 mL/hr over 30 Minutes Intravenous On call to O.R. 11/21/12 1336 11/21/12 1610   11/21/12 2200  cefOXitin (MEFOXIN) 2 g in dextrose 5 % 50 mL IVPB     2 g 100 mL/hr over 30 Minutes Intravenous Every 6 hours 11/21/12 2000        Assessment/Plan: Problem List: Patient Active Problem List  Diagnosis  . Anemia  . Lapband APS with [facility] repair Oct 2012  . Erosion of gastric band-explantation March 2014    NPO for now   Will check swallow in AM and if OK will start clears 1 Day Post-Op    LOS: 1 day   [name], MD, [facility] [phone] beeper [phone]  11/22/2012 1:04 PM

## 2012-11-22 NOTE — Care Management Note (Signed)
    Page 1 of 1   11/22/2012     11:52:11 AM   CARE MANAGEMENT NOTE 11/22/2012  Patient:  Kimberly Ortega, Kimberly Ortega   Account Number:  [id number]  Date Initiated:  11/22/2012  Documentation initiated by:  Lorenda Ishihara  Subjective/Objective Assessment:   47 yo female admitted s/p Upper endoscopy, laparoscopy, removal of lapband. PTA lived at home with spouse.     Action/Plan:   Home when stable   Anticipated DC Date:  11/25/2012   Anticipated DC Plan:  HOME/SELF CARE      DC Planning Services  CM consult      Choice offered to / List presented to:             Status of service:  Completed, signed off Medicare Important Message given?   (If response is "NO", the following Medicare IM given date fields will be blank) Date Medicare IM given:   Date Additional Medicare IM given:    Discharge Disposition:  HOME/SELF CARE  Per UR Regulation:  Reviewed for med. necessity/level of care/duration of stay  If discussed at Long Length of Stay Meetings, dates discussed:    Comments:

## 2012-11-23 ENCOUNTER — Inpatient Hospital Stay (HOSPITAL_COMMUNITY): Payer: BC Managed Care – PPO

## 2012-11-23 LAB — BASIC METABOLIC PANEL
BUN: 11 mg/dL (ref 6–23)
Chloride: 102 mEq/L (ref 96–112)
GFR calc Af Amer: >90 mL/min (ref >90)
Potassium: 3.7 mEq/L (ref 3.5–5.1)

## 2012-11-23 LAB — CBC
HCT: 29.5 % — ABNORMAL LOW (ref 36.0–46.0)
Hemoglobin: 9.2 g/dL — ABNORMAL LOW (ref 12.0–15.0)
RBC: 3.83 MIL/uL — ABNORMAL LOW (ref 3.87–5.11)
RDW: 15.4 % (ref 11.5–15.5)
WBC: 11 10*3/uL — ABNORMAL HIGH (ref 4.0–10.5)

## 2012-11-23 MED ORDER — OXYCODONE-ACETAMINOPHEN 5-325 MG/5ML PO SOLN
10.0000 mL | ORAL | Status: DC | PRN
  Administered 2012-11-23 – 2012-11-24 (×2): 10 mL via ORAL
  Filled 2012-11-23 (×2): qty 10

## 2012-11-23 MED ORDER — ONDANSETRON 4 MG PO TBDP
4.0000 mg | ORAL_TABLET | Freq: Three times a day (TID) | ORAL | Status: DC | PRN
  Administered 2012-11-23 – 2012-11-24 (×2): 4 mg via ORAL
  Filled 2012-11-23 (×2): qty 1

## 2012-11-23 MED ORDER — IOHEXOL 300 MG/ML  SOLN
50.0000 mL | Freq: Once | INTRAMUSCULAR | Status: AC | PRN
  Administered 2012-11-23: 50 mL via ORAL

## 2012-11-23 NOTE — Progress Notes (Addendum)
Patient ID: Kimberly Ortega, female   DOB: Aug 08, 1966, 47 y.o.   MRN: [id number] [facility] Progress Note:   2 Days Post-Op  Subjective: Mental status is clear Objective: Vital signs in last 24 hours: Temp:  [98.1 F (36.7 C)-98.6 F (37 C)] 98.6 F (37 C) (03/05 0624) Pulse Rate:  [45-68] 66 (03/05 0624) Resp:  [16-18] 16 (03/05 0624) BP: (88-119)/(56-73) 93/60 mmHg (03/05 0624) SpO2:  [98 %-100 %] 99 % (03/05 0624)  Intake/Output from previous day: 03/04 0701 - 03/05 0700 In: 2500 [I.V.:2400; IV Piggyback:100] Out: 130 [Drains:130] Intake/Output this shift: Total I/O In: 326.7 [I.V.:326.7] Out: -   Physical Exam: Work of breathing is OK.  JP serosanguinous Minimal abdominal pain  Lab Results:  Results for orders placed during the hospital encounter of 11/21/12 (from the past 48 hour(s))  SURGICAL PCR SCREEN     Status: Abnormal   Collection Time    11/21/12  1:37 PM      Result Value Range   MRSA, PCR INVALID RESULTS, SPECIMEN SENT FOR CULTURE (*) NEGATIVE   Comment: CALLED TO THOMPSON S ON [id number] AT 1550 BY INGLEE   Staphylococcus aureus INVALID RESULTS, SPECIMEN SENT FOR CULTURE (*) NEGATIVE   Comment: CALLED TO THOMPSON S ON [id number] AT 1550 BY INGLEE                The Xpert SA Assay (FDA     approved for NASAL specimens     in patients over 4 years of age),     is one component of     a comprehensive surveillance     program.  Test performance has     been validated by [employer] for patients greater     than or equal to 15 year old.     It is not intended     to diagnose infection nor to     guide or monitor treatment.  HCG, SERUM, QUALITATIVE     Status: None   Collection Time    11/21/12  2:00 PM      Result Value Range   Preg, Serum NEGATIVE  NEGATIVE  CBC     Status: Abnormal   Collection Time    11/21/12  2:00 PM      Result Value Range   WBC 8.6  4.0 - 10.5 K/uL   RBC 4.39  3.87 - 5.11 MIL/uL   Hemoglobin 10.4 (*) 12.0 - 15.0 g/dL    HCT 95.6 (*) 21.3 - 46.0 %   MCV 75.9 (*) 78.0 - 100.0 fL   MCH 23.7 (*) 26.0 - 34.0 pg   MCHC 31.2  30.0 - 36.0 g/dL   RDW 08.6  57.8 - 46.9 %   Platelets 276  150 - 400 K/uL  MRSA CULTURE     Status: None   Collection Time    11/21/12  2:15 PM      Result Value Range   Specimen Description NOSE     Special Requests NONE     Culture Culture reincubated for better growth     Report Status PENDING    CREATININE, SERUM     Status: Abnormal   Collection Time    11/21/12  8:57 PM      Result Value Range   Creatinine, Ser 0.83  0.50 - 1.10 mg/dL   GFR calc non Af Amer 83 (*) >90 mL/min   GFR calc Af Amer >90  >90 mL/min  Comment:            The eGFR has been calculated     using the CKD EPI equation.     This calculation has not been     validated in all clinical     situations.     eGFR's persistently     <90 mL/min signify     possible Chronic Kidney Disease.  CBC     Status: Abnormal   Collection Time    11/22/12  4:05 AM      Result Value Range   WBC 20.5 (*) 4.0 - 10.5 K/uL   RBC 4.26  3.87 - 5.11 MIL/uL   Hemoglobin 10.2 (*) 12.0 - 15.0 g/dL   HCT 09.8 (*) 11.9 - 14.7 %   MCV 75.6 (*) 78.0 - 100.0 fL   MCH 23.9 (*) 26.0 - 34.0 pg   MCHC 31.7  30.0 - 36.0 g/dL   RDW 82.9  56.2 - 13.0 %   Platelets 259  150 - 400 K/uL  COMPREHENSIVE METABOLIC PANEL     Status: Abnormal   Collection Time    11/22/12  4:05 AM      Result Value Range   Sodium 135  135 - 145 mEq/L   Potassium 4.3  3.5 - 5.1 mEq/L   Chloride 101  96 - 112 mEq/L   CO2 27  19 - 32 mEq/L   Glucose, Bld 165 (*) 70 - 99 mg/dL   BUN 11  6 - 23 mg/dL   Creatinine, Ser 8.65  0.50 - 1.10 mg/dL   Calcium 8.4  8.4 - 78.4 mg/dL   Total Protein 6.4  6.0 - 8.3 g/dL   Albumin 3.1 (*) 3.5 - 5.2 g/dL   AST 24  0 - 37 U/L   ALT 22  0 - 35 U/L   Alkaline Phosphatase 59  39 - 117 U/L   Total Bilirubin 0.5  0.3 - 1.2 mg/dL   GFR calc non Af Amer >90  >90 mL/min   GFR calc Af Amer >90  >90 mL/min   Comment:             The eGFR has been calculated     using the CKD EPI equation.     This calculation has not been     validated in all clinical     situations.     eGFR's persistently     <90 mL/min signify     possible Chronic Kidney Disease.  CBC     Status: Abnormal   Collection Time    11/23/12  3:55 AM      Result Value Range   WBC 11.0 (*) 4.0 - 10.5 K/uL   RBC 3.83 (*) 3.87 - 5.11 MIL/uL   Hemoglobin 9.2 (*) 12.0 - 15.0 g/dL   HCT 69.6 (*) 29.5 - 28.4 %   MCV 77.0 (*) 78.0 - 100.0 fL   MCH 24.0 (*) 26.0 - 34.0 pg   MCHC 31.2  30.0 - 36.0 g/dL   RDW 13.2  44.0 - 10.2 %   Platelets 234  150 - 400 K/uL  BASIC METABOLIC PANEL     Status: Abnormal   Collection Time    11/23/12  3:55 AM      Result Value Range   Sodium 136  135 - 145 mEq/L   Potassium 3.7  3.5 - 5.1 mEq/L   Chloride 102  96 - 112 mEq/L   CO2 25  19 -  32 mEq/L   Glucose, Bld 105 (*) 70 - 99 mg/dL   BUN 11  6 - 23 mg/dL   Creatinine, Ser 1.61  0.50 - 1.10 mg/dL   Calcium 8.2 (*) 8.4 - 10.5 mg/dL   GFR calc non Af Amer 86 (*) >90 mL/min   GFR calc Af Amer >90  >90 mL/min   Comment:            The eGFR has been calculated     using the CKD EPI equation.     This calculation has not been     validated in all clinical     situations.     eGFR's persistently     <90 mL/min signify     possible Chronic Kidney Disease.    Radiology/Results: Dg Ugi W/water Sol Cm  11/23/2012  *RADIOLOGY REPORT*  Clinical Data:  47 year old female status post gastric lap Ortega removal 2 days ago due to Ortega associated erosion.  N.p.o. since the time of surgery.  UPPER GI SERIES WITH KUB  Technique:  Routine upper GI series was performed with water soluble contrast, 50 ml Omnipaque-300.  Fluoroscopy Time: 1.4 minutes  Comparison:  CT abdomen and pelvis 10/04/2012.  Findings: Preprocedural scout view of the abdomen.  Surgical clips in the right upper quadrant.  Postoperative drain in place across the upper abdomen.  Gas in the stomach and  large and small bowel loops in a nonobstructed pattern. Incidental IUD.  First the patient was given a small sip of water which she tolerated well without difficulty.  Then small swallows of Omnipaque were observed with fluoroscopy. Prompt transit of contrast to the distal esophagus and through the gastroesophageal junction without delay.  Contrast filled the proximal stomach.  A somewhat capacious appearance of the distal esophagus.  No obstruction to the forward flow of contrast.  After 50 ml, contrast was present throughout most of the stomach.  No contrast leak identified.  A small volume of contrast remains in the distal esophagus.  On the post procedure KUB, there is early contrast transit to the duodenum.  IMPRESSION: No obstruction to contrast transit through the gastroesophageal junction and into the stomach.  No leak or adverse features identified.   Original Report Authenticated By: Erskine Speed, M.D.     Anti-infectives: Anti-infectives   Start     Dose/Rate Route Frequency Ordered Stop   11/22/12 0600  cefOXitin (MEFOXIN) 2 g in dextrose 5 % 50 mL IVPB     2 g 100 mL/hr over 30 Minutes Intravenous On call to O.R. 11/21/12 1336 11/21/12 1610   11/21/12 2200  cefOXitin (MEFOXIN) 2 g in dextrose 5 % 50 mL IVPB     2 g 100 mL/hr over 30 Minutes Intravenous Every 6 hours 11/21/12 2000        Assessment/Plan: Problem List: Patient Active Problem List  Diagnosis  . Anemia  . Lapband APS with [facility] repair Oct 2012  . 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  No leak.  Start liquids.  Patient wants to go home today.  Will do that if she gets up and walks about. 2 Days Post-Op    LOS: 2 days   [name], MD, [facility] [phone] beeper [phone]  11/23/2012 12:35 PM Low grade fever noted.  Continue Mefoxin and keep for further evaluation.  Reassess 3/6

## 2012-11-23 NOTE — Progress Notes (Signed)
[name] nausea improving--able to tolerate chocolate Unjury protein shake this afternoon. Pt ordered for Bariatric full liquid from dietary at this time. Ambulated in room & hallway with her dad & able to tolerate it as reported. Pt still weak as claimed & takes time for her to move in & out of bed. She requested for her temperature to be rechecked since she was feeling warm. Temp was= 99.0 around 2pm then recheck was T= 100.1. Pt was encouraged to use IS at intervals & educated of the benefits of it.  Temp recheck after IS use=100 oF. JP drain with serousanguinous drain about 40 cc. MD notified of above assessments and both him & pt agreed to for another night stay to observe pt's fever & recheck pt's WBC in am. Will continue to monitor pt's condition. [name] IV Mefoxin continued as ordered & Roxicet administered.

## 2012-11-24 LAB — MRSA CULTURE

## 2012-11-24 LAB — CBC
HCT: 27.3 % — ABNORMAL LOW (ref 36.0–46.0)
Hemoglobin: 8.6 g/dL — ABNORMAL LOW (ref 12.0–15.0)
MCHC: 31.5 g/dL (ref 30.0–36.0)

## 2012-11-24 MED ORDER — OXYCODONE-ACETAMINOPHEN 5-325 MG/5ML PO SOLN: 10.0000 mL | ORAL | Status: DC | PRN

## 2012-11-24 MED ORDER — AMOXICILLIN-POT CLAVULANATE 875-125 MG PO TABS: 1.0000 | ORAL_TABLET | Freq: Two times a day (BID) | ORAL | Status: DC

## 2012-11-24 MED ORDER — ONDANSETRON 4 MG PO TBDP: 4.0000 mg | ORAL_TABLET | Freq: Three times a day (TID) | ORAL | Status: DC | PRN

## 2012-11-24 NOTE — Discharge Summary (Signed)
Physician Discharge Summary  Patient ID: Jessyka Austria MRN: [id number] DOB/AGE: 11-15-1965 47 y.o.  Admit date: 11/21/2012 Discharge date: 11/24/2012  Admission Diagnoses:  Dysphagia after Lapband 2012  Discharge Diagnoses:  Gastric band erosion  Active Problems:   Erosion of gastric band-explantation March 2014   Surgery:  Explantation of lapband with JP drain placement  Discharged Condition: improved3  Hospital Course:   Had surgery on Monday afternoon.  Endoscopy at time of surgery showed erosion.  Laparoscopy and band explantation.  Swallow on PD 2 showed no leak and started on liquid diet.  Low grade fever so left on antibiotics.  Ready for discharge on Thursday  Consults: none  Significant Diagnostic Studies: UGI    Discharge Exam: Blood pressure 90/56, pulse 65, temperature 98.6 F (37 C), temperature source Oral, resp. rate 16, height 5\' 6"  (1.676 m), weight 210 lb (95.255 kg), last menstrual period 11/22/2012, SpO2 93.00%. JP is serous.  No appreciable abdominal pain  Disposition: 01-Home or Self Care  Discharge Orders   Future Orders Complete By Expires     Call MD for:  severe uncontrolled pain  As directed     Call MD for:  As directed     Scheduling Instructions:      Temp greater that 101    Diet Carb Modified  As directed     Comments:      Full liquids    Discharge instructions  As directed     Comments:      Stay on full liquid diet. Empty JP drainage daily and record volume    Discharge wound care:  As directed     Comments:      Apply neosporin around JP site. May shower otherwise    Increase activity slowly  As directed         Medication List    TAKE these medications       acetaminophen 80 MG chewable tablet  Commonly known as:  TYLENOL  Chew 160 mg by mouth every 4 (four) hours as needed for pain (pain).     ALPRAZolam 1 MG tablet  Commonly known as:  XANAX  Take 1 mg by mouth 2 (two) times daily as needed for anxiety.     amoxicillin-clavulanate 875-125 MG per tablet  Commonly known as:  AUGMENTIN  Take 1 tablet by mouth 2 (two) times daily.     CLARITIN 5 MG chewable tablet  Generic drug:  loratadine  Chew 5 mg by mouth daily.     multivitamin with minerals Tabs  Take 1 tablet by mouth daily.     ondansetron 4 MG disintegrating tablet  Commonly known as:  ZOFRAN-ODT  Take 1 tablet (4 mg total) by mouth every 8 (eight) hours as needed.     OVER THE COUNTER MEDICATION  Take 1 tablet by mouth daily. Probiotic colon health     oxyCODONE-acetaminophen 5-325 MG/5ML solution  Commonly known as:  ROXICET  Take 10 mLs by mouth every 4 (four) hours as needed.           Follow-up Information   Follow up with [name] B, MD In 2 weeks. (Call Dr. Ovidio Kin if you have any problems next week)    Contact information:   [address] [place] [address] [phone]       Signed: Valarie Merino 11/24/2012, 8:30 AM

## 2012-11-25 ENCOUNTER — Telehealth (INDEPENDENT_AMBULATORY_CARE_PROVIDER_SITE_OTHER): Payer: Self-pay | Admitting: General Surgery

## 2012-11-25 NOTE — Telephone Encounter (Signed)
Pt called to ask for her Augmentin to be changed to liquid; she cannot swallow the large pill nor crush it to take with soft food (applesauce or pudding or yogurt.)  She has pain med in liquid form already.  Called [facility]:  [phone] and arranged with the pharmacist to change it to liquid.  Pt is aware.

## 2012-11-29 ENCOUNTER — Ambulatory Visit (INDEPENDENT_AMBULATORY_CARE_PROVIDER_SITE_OTHER): Payer: BC Managed Care – PPO

## 2012-11-29 DIAGNOSIS — Z4802 Encounter for removal of sutures: Secondary | ICD-10-CM

## 2012-12-15 ENCOUNTER — Encounter (INDEPENDENT_AMBULATORY_CARE_PROVIDER_SITE_OTHER): Payer: BC Managed Care – PPO | Admitting: Surgery

## 2012-12-21 ENCOUNTER — Telehealth (INDEPENDENT_AMBULATORY_CARE_PROVIDER_SITE_OTHER): Payer: Self-pay | Admitting: *Deleted

## 2012-12-21 NOTE — Telephone Encounter (Signed)
Patient states she is unable to make the appt on 12/30/12 bc she has to be at work.  Asking for another appt time.

## 2012-12-27 ENCOUNTER — Telehealth (INDEPENDENT_AMBULATORY_CARE_PROVIDER_SITE_OTHER): Payer: Self-pay

## 2012-12-27 NOTE — Telephone Encounter (Signed)
Called pt to r/s her appt on April 11.  The first available is May 28th.  I let the pt know.  Pt stated "Just tell Dr Daphine Deutscher that he hasn't seen me since my surgery.  He doesn't have a problem coming in JUST to see me.  I'm sure there are other patients that can be moved around for me to come in.  Not all of them had surgery."  I worked her into an 830 slot on April 24.

## 2012-12-29 ENCOUNTER — Telehealth (INDEPENDENT_AMBULATORY_CARE_PROVIDER_SITE_OTHER): Payer: Self-pay | Admitting: *Deleted

## 2012-12-29 NOTE — Telephone Encounter (Signed)
Patient called to ask for prescription for Omeprazole.  Patient states it is cheaper for her to get it under her insurance rather than OTC.

## 2012-12-30 ENCOUNTER — Ambulatory Visit (INDEPENDENT_AMBULATORY_CARE_PROVIDER_SITE_OTHER): Payer: BC Managed Care – PPO | Admitting: Surgery

## 2012-12-30 NOTE — Telephone Encounter (Signed)
Called to let patient know that Daphine Deutscher MD has ok'd to call in Omeprazole 40mg  1 tablet daily # 30 with 2 refills.  Called into [facility] [phone].

## 2013-01-12 ENCOUNTER — Ambulatory Visit (INDEPENDENT_AMBULATORY_CARE_PROVIDER_SITE_OTHER): Payer: BC Managed Care – PPO | Admitting: Surgery

## 2013-01-12 VITALS — BP 112/80 | HR 64 | Temp 98.6°F | Resp 18 | Ht 66.6 in | Wt 225.0 lb

## 2013-01-12 DIAGNOSIS — Z5189 Encounter for other specified aftercare: Secondary | ICD-10-CM

## 2013-01-12 NOTE — Progress Notes (Signed)
Kimberly Ortega 47 y.o.  Body mass index is 35.65 kg/(m^2).  Patient Active Problem List  Diagnosis  . Anemia  . Lapband APS with [facility] repair Oct 2012  . Erosion of gastric band-explantation March 2014    Allergies  Allergen Reactions  . Morphine And Related Nausea Only    Past Surgical History  Procedure Laterality Date  . Cholecystectomy  1991  . Laparoscopic gastric banding  07/07/11  . Dilation and curettage of uterus    . Upper gi endoscopy N/A 11/21/2012    Procedure: UPPER GI ENDOSCOPY Kimberly Ortega DIAGNOSTIC LAPAROSCOPY /REMOVAL LAP GASTRIC BAND/INSERTION OF DRAIN;  Surgeon: Valarie Merino, MD;  Location: [facility];  Service: General;  Laterality: N/A;   VYAS,Kimberly B., MD No diagnosis found.  Followup visit after removal of her lap band. Her incisions have healed okay.We had a long discussion regarding potential Roux-en-Y gastric bypass or sleeve gastrectomy. In the meantime I have asked her to read about Qsymia.  She is actually taken both of these drugs independently. We will try to colon and 4. The meantime and did not recommend that she try the herbal supplement that was featured: Dr. Shaune Pascal program,  Garcinia Cambogia.    Return 3 weeks.   Kimberly B. Kimberly Deutscher, MD, [facility] [phone] beeper [phone]  01/12/2013 9:43 AM

## 2013-01-12 NOTE — Patient Instructions (Signed)
I would not recommend Garcinia cambogia Read about Qsymia and see if you want to go on this and let me  Know.

## 2013-01-25 ENCOUNTER — Ambulatory Visit (INDEPENDENT_AMBULATORY_CARE_PROVIDER_SITE_OTHER): Payer: BC Managed Care – PPO | Admitting: Surgery

## 2013-01-30 ENCOUNTER — Telehealth (INDEPENDENT_AMBULATORY_CARE_PROVIDER_SITE_OTHER): Payer: Self-pay | Admitting: *Deleted

## 2013-01-30 NOTE — Telephone Encounter (Signed)
Patient has now called back and asked if Dr. Daphine Deutscher can just call her husband Dr. Rozetta Nunnery [phone] regarding the surgical notes.

## 2013-01-30 NOTE — Telephone Encounter (Signed)
Patient called stating no difficulty physically with her surgery.  Patient has no signs or symptoms of anything.  Patient called today regarding her bill.  Patient asking for a letter to be sent by Dr. Daphine Deutscher regarding medical necessity.  Spoke to [employer] in billing who states we have not received that request from [employer].  Lauren with billing will speak with patient regarding this matter.

## 2013-02-07 ENCOUNTER — Telehealth (INDEPENDENT_AMBULATORY_CARE_PROVIDER_SITE_OTHER): Payer: Self-pay | Admitting: *Deleted

## 2013-02-07 NOTE — Telephone Encounter (Signed)
Routing to Dr.Martin

## 2013-02-07 NOTE — Telephone Encounter (Signed)
Patient called to state that she needs Dr. Daphine Deutscher to increase the dosage on her diet pills he prescribes for her.  Patient states there are different levels and she is on the starter dose but needs to be moved up to the 7.5/46 strength prior to her next appt to see Dr. Daphine Deutscher.  Patient states she is still gaining weight on this diet pill.  Patient is also asking about a coupon for the pill and the 1-800 number for it since the last coupon she received was expired.

## 2013-02-08 NOTE — Telephone Encounter (Signed)
This RN called and spoke to pharmacist at [facility] [phone] who stated patient had initial package of Qsymia which included 2 weeks at 3.75/23mg  then 2 weeks at 7.5/46mg .  It is recommended by the pharmacist that the next dosage be 2 additional weeks at 7.5/46mg  then 2 weeks at 15/92mg .  If the patient is doing well after those 4 weeks then an additional 2 weeks at the 15/92mg  dose can be written.  So at this time prescription will be for 2 weeks at 7.5/46mg  then 2 weeks at 15/92mg  called [facility] [phone]

## 2013-03-08 ENCOUNTER — Encounter (INDEPENDENT_AMBULATORY_CARE_PROVIDER_SITE_OTHER): Payer: Self-pay | Admitting: Surgery

## 2013-03-08 ENCOUNTER — Ambulatory Visit (INDEPENDENT_AMBULATORY_CARE_PROVIDER_SITE_OTHER): Payer: BC Managed Care – PPO | Admitting: Surgery

## 2013-03-08 VITALS — BP 118/72 | HR 64 | Temp 97.8°F | Resp 16 | Ht 66.75 in | Wt 207.4 lb

## 2013-03-08 DIAGNOSIS — Z5189 Encounter for other specified aftercare: Secondary | ICD-10-CM

## 2013-03-08 MED ORDER — OMEPRAZOLE 40 MG PO CPDR: 40.0000 mg | DELAYED_RELEASE_CAPSULE | Freq: Every day | ORAL | Status: DC

## 2013-03-08 NOTE — Progress Notes (Signed)
followup visit and under his weight has come down using Qsymia 7.5/46.  Will refill at that level. Today's weight is 207. She is having some issues with gastroesophageal reflux and I have restarted her omeprazole.  She has also had issues with a 17,000 or Bill related to [employer] denying her claim for hospitalization and her band was removed. Will be dictating a letter to them regarding this hospitalization.  Return in 6 weeks.

## 2013-03-08 NOTE — Patient Instructions (Signed)
Phentermine orally disintegrating tablets (ODT) What is this medicine? PHENTERMINE (FEN ter meen) decreases your appetite. It is used with a reduced calorie diet and exercise to help you lose weight. This medicine may be used for other purposes; ask your health care provider or pharmacist if you have questions. What should I tell my health care provider before I take this medicine? They need to know if you have any of these conditions: -agitation -glaucoma -heart disease -high blood pressure -history of substance abuse -lung disease called Primary Pulmonary Hypertension (PPH) -taken an MAOI like Carbex, Eldepryl, Marplan, Nardil, or Parnate in last 14 days -thyroid disease -an unusual or allergic reaction to phentermine, other medicines, foods, dyes, or preservatives -pregnant or trying to get pregnant -breast-feeding How should I use this medicine? Take this medicine by mouth. Follow the directions on the prescription label. The tablets should stay in the bottle until immediately before you take your dose. Use dry hands to remove the dose from the bottle. Do not cut or split the tablets in half. Place the tablet in the mouth and allow it to dissolve, and then swallow. While you may take these tablets with water, it is not necessary to do so. Take your doses at regular intervals. Do not take your medicine more often than directed. Do not stop taking except on the advice of your doctor or health care professional. Talk to your pediatrician regarding the use of this medicine in children. Special care may be needed. Overdosage: If you think you've taken too much of this medicine contact a poison control center or emergency room at once. Overdosage: If you think you have taken too much of this medicine contact a poison control center or emergency room at once. NOTE: This medicine is only for you. Do not share this medicine with others. What if I miss a dose? If you miss a dose, take it as soon as  you can. If it is almost time for your next dose, take only that dose. Do not take double or extra doses. What may interact with this medicine? Do not take this medicine with any of the following medications: -duloxetine -MAOIs like Carbex, Eldepryl, Marplan, Nardil, and Parnate -medicines for colds or breathing difficulties like pseudoephedrine or phenylephrine -procarbazine -sibutramine -SSRIs like citalopram, escitalopram, fluoxetine, fluvoxamine, paroxetine, and sertraline -stimulants like dexmethylphenidate, methylphenidate or modafinil -venlafaxine  This medicine may also interact with the following medications: -medicines for diabetes This list may not describe all possible interactions. Give your health care provider a list of all the medicines, herbs, non-prescription drugs, or dietary supplements you use. Also tell them if you smoke, drink alcohol, or use illegal drugs. Some items may interact with your medicine. What should I watch for while using this medicine? Notify your physician immediately if you become short of breath while doing your normal activities. Do not take this medicine within 6 hours of bedtime. It can keep you from getting to sleep. Avoid drinks that contain caffeine and try to stick to a regular bedtime every night. This medicine is intended to be used in addition to a healthy diet and exercise. The best results are achieved this way. This medicine is only indicated for short-term use. Eventually your weight loss may level out. At that point, the drug will only help you maintain your new weight. Do not increase or in any way change your dose without consulting your doctor. You may get drowsy or dizzy. Do not drive, use machinery, or do anything that  needs mental alertness until you know how this medicine affects you. Do not stand or sit up quickly, especially if you are an older patient. This reduces the risk of dizzy or fainting spells. Alcohol may increase dizziness  and drowsiness. Avoid alcoholic drinks. What side effects may I notice from receiving this medicine? Side effects that you should report to your doctor or health care professional as soon as possible: -chest pain, palpitations -depression or severe changes in mood -increased blood pressure -irritability -nervousness or restlessness -severe dizziness -shortness of breath -problems urinating -unusual swelling of the legs -vomiting  Side effects that usually do not require medical attention (report to your doctor or health care professional if they continue or are bothersome): -blurred vision or other eye problems -changes in sexual ability or desire -constipation or diarrhea -difficulty sleeping -dry mouth or unpleasant taste -headache -nausea This list may not describe all possible side effects. Call your doctor for medical advice about side effects. You may report side effects to FDA at 1-800-FDA-1088. Where should I keep my medicine? Keep out of the reach of children. This medicine can be abused. Keep your medicine in a safe place to protect it from theft. Do not share this medicine with anyone. Selling or giving away this medicine is dangerous and against the law. Store at room temperature between 20 and 25 degrees C (68 and 77 degrees F). Keep container tightly closed. Throw away any unused medicine after the expiration date. NOTE: This sheet is a summary. It may not cover all possible information. If you have questions about this medicine, talk to your doctor, pharmacist, or health care provider.  2013, [employer]vier/[employer]. (10/22/2010 10:40:21 AM)

## 2013-05-09 ENCOUNTER — Telehealth (INDEPENDENT_AMBULATORY_CARE_PROVIDER_SITE_OTHER): Payer: Self-pay | Admitting: Surgery

## 2013-05-09 ENCOUNTER — Encounter (INDEPENDENT_AMBULATORY_CARE_PROVIDER_SITE_OTHER): Payer: Self-pay | Admitting: Surgery

## 2013-05-09 ENCOUNTER — Ambulatory Visit (INDEPENDENT_AMBULATORY_CARE_PROVIDER_SITE_OTHER): Payer: BC Managed Care – PPO | Admitting: Surgery

## 2013-05-09 VITALS — BP 110/68 | HR 66 | Resp 14 | Ht 66.75 in | Wt 193.0 lb

## 2013-05-09 DIAGNOSIS — K2970 Gastritis, unspecified, without bleeding: Secondary | ICD-10-CM

## 2013-05-09 MED ORDER — SUCRALFATE 1 GM/10ML PO SUSP: 1.0000 g | Freq: Four times a day (QID) | ORAL | Status: DC

## 2013-05-09 NOTE — Patient Instructions (Signed)
Thanks for your patience.  If you need further assistance after leaving the office, please call our office and speak with a [facility] nurse.  [phone].  If you want to leave a message for Dr. Jaiel Saraceno, please call his office phone at [phone]. 

## 2013-05-09 NOTE — Telephone Encounter (Signed)
Patient met with surgery scheduling, went over financial responsibility, patient will call back to schedule, face sheet place in pending file.

## 2013-05-09 NOTE — Progress Notes (Addendum)
Kimberly Ortega 47 y.o.  Body mass index is 30.47 kg/(m^2).  Patient Active Problem List   Diagnosis Date Noted  . Erosion of gastric band-explantation March 2014 11/21/2012  . Lapband APS with [facility] repair Oct 2012 10/22/2011  . Anemia 07/23/2011    Allergies  Allergen Reactions  . Morphine And Related Nausea Only    Past Surgical History  Procedure Laterality Date  . Cholecystectomy  1991  . Laparoscopic gastric banding  07/07/11  . Dilation and curettage of uterus    . Upper gi endoscopy N/A 11/21/2012    Procedure: UPPER GI ENDOSCOPY Kimberly Ortega DIAGNOSTIC LAPAROSCOPY /REMOVAL LAP GASTRIC BAND/INSERTION OF DRAIN;  Surgeon: Valarie Merino, MD;  Location: [facility];  Service: General;  Laterality: N/A;  . Laparoscopic repair and removal of gastric band     VYAS,DHRUV B., MD 1. Unspecified gastritis and gastroduodenitis without mention of hemorrhage     Comes in today complaining of midepigastric pain that is intermittent and not related to meals. He is in the epigastrium. She's had her gallbladder removed previously. Point of maximal tenderness is in the midpoint and he said it felt like when her lap band and eroded. Under treat this symptomatically like this is a gastritis. I will treat her with sucrafate for a for about 10 days.  She is also worried about a palpable mass and she is found in her perianal region. On exam she has a 4 millimeter nodule on the right perianal region. This feels more subcutaneous for subcuticular. It is not pigmented and is nontender. I think because of where it is it should be excised and we'll to get her scheduled for excision under light general as an outpatient. [name], MD, [facility] [phone] beeper [phone]  05/09/2013 2:43 PM  Per her request a hand completed script for Qsymia 7.5/46 mg (#30) 1 qd--1 refill given

## 2014-09-21 HISTORY — PX: COLONOSCOPY: SHX174

## 2016-03-09 ENCOUNTER — Encounter: Payer: Self-pay | Admitting: *Deleted

## 2016-03-11 ENCOUNTER — Encounter: Payer: Self-pay | Admitting: *Deleted

## 2016-03-11 ENCOUNTER — Ambulatory Visit (INDEPENDENT_AMBULATORY_CARE_PROVIDER_SITE_OTHER): Payer: 59 | Admitting: Cardiology

## 2016-03-11 ENCOUNTER — Encounter: Payer: Self-pay | Admitting: Cardiology

## 2016-03-11 VITALS — BP 118/78 | HR 67 | Ht 66.0 in | Wt 227.0 lb

## 2016-03-11 DIAGNOSIS — Z136 Encounter for screening for cardiovascular disorders: Secondary | ICD-10-CM

## 2016-03-11 DIAGNOSIS — G4733 Obstructive sleep apnea (adult) (pediatric): Secondary | ICD-10-CM

## 2016-03-11 DIAGNOSIS — F419 Anxiety disorder, unspecified: Secondary | ICD-10-CM

## 2016-03-11 DIAGNOSIS — I10 Essential (primary) hypertension: Secondary | ICD-10-CM

## 2016-03-11 DIAGNOSIS — R0789 Other chest pain: Secondary | ICD-10-CM

## 2016-03-11 NOTE — Progress Notes (Addendum)
Cardiology Office Note  Date: 03/11/2016   ID: Kimberly Ortega, DOB October 09, 1965, MRN [id number]  PCP: Glenda Chroman, MD  Consulting Cardiologist: Rozann Lesches, MD   Chief Complaint  Patient presents with  . Chest Pain    History of Present Illness: Kimberly Ortega is a 50 y.o. female referred for cardiology consultation by Dr. Woody Seller. I reviewed the available records. She was admitted to [facility] back in early June with chest discomfort. Workup at that time revealed negative troponin T levels, normal d-dimer, ECG without acute ST segment changes, and negative chest x-ray. She saw Dr. Woody Seller back in the office and an echocardiogram was obtained with findings of normal LVEF and no major valvular abnormalities.  She has a history of hypertension and obstructive sleep apnea but no other major cardiac risk factors. Was previously morbidly obese, underwent lap band procedure which had to be removed, and was able to lose 108 pounds at one point. She has gone back to walking for exercise, was walking 5 miles and more at a time when she had her symptoms back in June. She reports being mildly short of breath. Admits that anxiety is also a significant problem for her. She hears stories about other peoples conditions and worries that she may have them herself. She reports undergoing stress testing several years ago.  I reviewed her follow-up ECG today which shows sinus bradycardia with decreased anteroseptal R waves and low voltage.  Past Medical History  Diagnosis Date  . GERD (gastroesophageal reflux disease)   . Anemia   . Migraine   . Family history of breast cancer     Cousins on both sides of family  . Arthritis   . Anxiety   . History of melanoma   . History of recurrent UTIs   . Abdominal pain   . Polycystic ovarian disease   . Essential hypertension   . Obstructive sleep apnea     Past Surgical History  Procedure Laterality Date  . Cholecystectomy  1991  . Laparoscopic gastric banding   07/07/11  . Dilation and curettage of uterus    . Upper gi endoscopy N/A 11/21/2012    Procedure: UPPER GI ENDOSCOPY Ileene Hutchinson DIAGNOSTIC LAPAROSCOPY /REMOVAL LAP GASTRIC BAND/INSERTION OF DRAIN;  Surgeon: Pedro Earls, MD;  Location: [facility];  Service: General;  Laterality: N/A;  . Laparoscopic repair and removal of gastric band      Current Outpatient Prescriptions  Medication Sig Dispense Refill  . acetaminophen (TYLENOL) 80 MG chewable tablet Chew 160 mg by mouth every 4 (four) hours as needed for pain (pain).    Marland Kitchen ALPRAZolam (XANAX) 1 MG tablet Take 1 mg by mouth 3 (three) times daily as needed for anxiety.     . Calcium-Magnesium-Vitamin D (CALCIUM 500 PO) Take by mouth.    . cholecalciferol (VITAMIN D) 1000 UNITS tablet Take 1,000 Units by mouth daily.    . fish oil-omega-3 fatty acids 1000 MG capsule Take 2 g by mouth daily.    Marland Kitchen loratadine (CLARITIN) 5 MG chewable tablet Chew 5 mg by mouth daily.    . Multiple Vitamin (MULTIVITAMIN WITH MINERALS) TABS Take 1 tablet by mouth daily.    . Nitrofurantoin Monohyd Macro (MACROBID PO) Take by mouth.    Marland Kitchen omeprazole (PRILOSEC) 40 MG capsule Take 1 capsule (40 mg total) by mouth daily. 60 capsule 3  . OVER THE COUNTER MEDICATION Take 1 tablet by mouth daily. Probiotic colon health    . OVER THE COUNTER  MEDICATION Hair, skin & nails    . sucralfate (CARAFATE) 1 GM/10ML suspension Take 10 mL (1 g total) by mouth 4 (four) times daily. 420 mL 0   No current facility-administered medications for this visit.   Allergies:  Morphine and related and Tramadol   Social History: The patient  reports that she has never smoked. She has never used smokeless tobacco. She reports that she does not drink alcohol or use illicit drugs.   Family History: The patient's family history includes Aneurysm in her mother; Breast cancer in her cousin; Cervical cancer in her mother and sister; Stroke in her mother.   ROS:  Please see the history of present  illness. Otherwise, complete review of systems is positive for intermittent leg pain, fatigue.  All other systems are reviewed and negative.   Physical Exam: VS:  BP 118/78 mmHg  Pulse 67  Ht 5\' 6"  (1.676 m)  Wt 227 lb (102.967 kg)  BMI 36.66 kg/m2  SpO2 95%, BMI Body mass index is 36.66 kg/(m^2).  Wt Readings from Last 3 Encounters:  03/11/16 227 lb (102.967 kg)  05/09/13 193 lb (87.544 kg)  03/08/13 207 lb 6.4 oz (94.076 kg)    General: Overweight woman, appears comfortable at rest. HEENT: Conjunctiva and lids normal, oropharynx clear. Neck: Supple, no elevated JVP or carotid bruits, no thyromegaly. Lungs: Clear to auscultation, nonlabored breathing at rest. Cardiac: Regular rate and rhythm, no S3 or significant systolic murmur, no pericardial rub. Abdomen: Soft, nontender, bowel sounds present, no guarding or rebound. Extremities: No pitting edema, distal pulses 2+. Skin: Warm and dry. Musculoskeletal: No kyphosis. Neuropsychiatric: Alert and oriented x3, affect grossly appropriate.  ECG: I personally reviewed the tracing from 02/19/2016 which showed normal sinus rhythm with poor R-wave progression and low voltage.  Recent Labwork:  June 2017: BUN 14, creatinine 0.7, AST 18, ALT 12, potassium 3.8, d-dimer 0.32, troponin T negative, hemoglobin 11.9, platelets 252  Other Studies Reviewed Today:  Echocardiogram 03/02/2016 [facility]): LVEF 55-60% with normal diastolic function, trace mitral regurgitation, trace tricuspid regurgitation, no pericardial effusion.  Chest x-ray 02/19/2016 [facility]): No acute pulmonary process.  Assessment and Plan:  1. Atypical chest pain. No evidence of ACS by hospital workup at [facility] earlier in June. Follow-up echocardiogram per Dr. Woody Seller shows normal LVEF with no significant valvular abnormalities. ECG shows no acute ST segment changes. She has not undergone any ischemic testing, had increased her activity prior to the onset of  the symptoms. Cardiac risk factors include hypertension, also obesity and obstructive sleep apnea. We will arrange an exercise echocardiogram for objective evaluation.  2. Obesity with history of lap band procedure and subsequent reversal. She has been trying to lose weight recently.  3. Essential hypertension, blood pressure is well controlled today.  4. Obstructive sleep apnea, followed by Dr. Woody Seller.  5. Anxiety and generalized worry about her overall health.  Current medicines were reviewed with the patient today.   Orders Placed This Encounter  Procedures  . EKG 12-Lead  . ECHO STRESS TEST    Disposition: Call with results.  Signed, Satira Sark, MD, [facility] 03/11/2016 9:05 AM    [facility] [place], [place], [place] [address] Phone: [phone]; Fax: [phone]

## 2016-03-11 NOTE — Patient Instructions (Signed)
Medication Instructions:   Continue all current medications.  Labwork:  NONE  Testing/Procedures:  Your physician has requested that you have a stress echocardiogram. For further information please visit [email or web address]. Please follow instruction sheet as given.  Office will contact with results via phone or letter.    Follow-Up:  Pending test results.    If you need a refill on your cardiac medications before your next appointment, please call your pharmacy.  

## 2016-03-19 ENCOUNTER — Other Ambulatory Visit (HOSPITAL_COMMUNITY): Payer: 59

## 2016-03-25 ENCOUNTER — Inpatient Hospital Stay (HOSPITAL_COMMUNITY): Admission: RE | Admit: 2016-03-25 | Payer: 59 | Source: Ambulatory Visit

## 2016-03-25 ENCOUNTER — Ambulatory Visit (HOSPITAL_COMMUNITY)
Admission: RE | Admit: 2016-03-25 | Discharge: 2016-03-25 | Disposition: A | Discharge status: Home / Self Care | Payer: 59 | Source: Ambulatory Visit | Attending: Cardiology | Admitting: Cardiology

## 2016-03-25 DIAGNOSIS — R072 Precordial pain: Secondary | ICD-10-CM | POA: Diagnosis not present

## 2016-03-25 DIAGNOSIS — R0789 Other chest pain: Secondary | ICD-10-CM

## 2016-03-25 LAB — ECHOCARDIOGRAM STRESS TEST
CHL CUP MPHR: 170 {beats}/min
CHL CUP RESTING HR STRESS: 64 {beats}/min
CHL RATE OF PERCEIVED EXERTION: 16
CSEPED: 8 min
CSEPEDS: 48 s
Estimated workload: 10.1 METS
Peak HR: 187 {beats}/min
Percent HR: 110 %

## 2016-03-25 NOTE — Progress Notes (Signed)
*  PRELIMINARY RESULTS* Echocardiogram Echocardiogram Stress Test has been performed.  Samuel Germany 03/25/2016, 10:13 AM

## 2016-03-26 ENCOUNTER — Telehealth: Payer: Self-pay | Admitting: *Deleted

## 2016-03-26 NOTE — Telephone Encounter (Signed)
-----   Message from Satira Sark, MD sent at 03/26/2016  7:25 AM EDT ----- Results reviewed. Reassuring results. Stress test was normal and suggested low risk for adverse cardiac events. A copy of this test should be forwarded to Glenda Chroman, MD.

## 2016-03-26 NOTE — Telephone Encounter (Signed)
-----   Message from Satira Sark, MD sent at 03/26/2016 10:43 AM EDT ----- She can keep follow-up with PCP since stress echo normal.  ----- Message -----    From: Massie Maroon, CMA    Sent: 03/26/2016  10:23 AM      To: Satira Sark, MD  Did you want this pt to f/u with you or defer to pcp with normal stress echo results?  Artavius Stearns

## 2016-03-26 NOTE — Telephone Encounter (Signed)
Pt aware, routed to pcp 

## 2016-03-26 NOTE — Telephone Encounter (Signed)
Pt aware.

## 2016-07-20 ENCOUNTER — Encounter: Payer: Self-pay | Admitting: Internal Medicine

## 2016-07-20 ENCOUNTER — Ambulatory Visit (INDEPENDENT_AMBULATORY_CARE_PROVIDER_SITE_OTHER)
Admission: RE | Admit: 2016-07-20 | Discharge: 2016-07-20 | Disposition: A | Discharge status: Home / Self Care | Payer: 59 | Source: Ambulatory Visit | Attending: Internal Medicine | Admitting: Internal Medicine

## 2016-07-20 ENCOUNTER — Ambulatory Visit (INDEPENDENT_AMBULATORY_CARE_PROVIDER_SITE_OTHER): Payer: 59 | Admitting: Internal Medicine

## 2016-07-20 ENCOUNTER — Other Ambulatory Visit (INDEPENDENT_AMBULATORY_CARE_PROVIDER_SITE_OTHER): Payer: 59

## 2016-07-20 VITALS — BP 116/80 | HR 96 | Ht 66.75 in | Wt 248.0 lb

## 2016-07-20 DIAGNOSIS — R0600 Dyspnea, unspecified: Secondary | ICD-10-CM | POA: Diagnosis not present

## 2016-07-20 DIAGNOSIS — R0609 Other forms of dyspnea: Secondary | ICD-10-CM | POA: Insufficient documentation

## 2016-07-20 LAB — BASIC METABOLIC PANEL
BUN: 18 mg/dL (ref 6–23)
CHLORIDE: 100 meq/L (ref 96–112)
CO2: 31 meq/L (ref 19–32)
CREATININE: 0.91 mg/dL (ref 0.40–1.20)
Calcium: 9.4 mg/dL (ref 8.4–10.5)
GFR: 69.21 mL/min (ref >60.00)
GLUCOSE: 101 mg/dL — AB (ref 70–99)
POTASSIUM: 3.4 meq/L — AB (ref 3.5–5.1)
Sodium: 139 mEq/L (ref 135–145)

## 2016-07-20 LAB — HEPATIC FUNCTION PANEL
ALT: 23 U/L (ref 0–35)
AST: 20 U/L (ref 0–37)
Albumin: 3.9 g/dL (ref 3.5–5.2)
Alkaline Phosphatase: 71 U/L (ref 39–117)
BILIRUBIN DIRECT: 0.1 mg/dL (ref 0.0–0.3)
BILIRUBIN TOTAL: 0.3 mg/dL (ref 0.2–1.2)
Total Protein: 7.1 g/dL (ref 6.0–8.3)

## 2016-07-20 LAB — CBC WITH DIFFERENTIAL/PLATELET
BASOS PCT: 0.3 % (ref 0.0–3.0)
Basophils Absolute: 0 10*3/uL (ref 0.0–0.1)
EOS PCT: 1.2 % (ref 0.0–5.0)
Eosinophils Absolute: 0.1 10*3/uL (ref 0.0–0.7)
HCT: 37.6 % (ref 36.0–46.0)
Hemoglobin: 12.7 g/dL (ref 12.0–15.0)
LYMPHS ABS: 1.9 10*3/uL (ref 0.7–4.0)
Lymphocytes Relative: 18.2 % (ref 12.0–46.0)
MCHC: 33.7 g/dL (ref 30.0–36.0)
MCV: 83.7 fl (ref 78.0–100.0)
MONOS PCT: 4.7 % (ref 3.0–12.0)
Monocytes Absolute: 0.5 10*3/uL (ref 0.1–1.0)
NEUTROS ABS: 8 10*3/uL — AB (ref 1.4–7.7)
NEUTROS PCT: 75.6 % (ref 43.0–77.0)
Platelets: 281 10*3/uL (ref 150.0–400.0)
RBC: 4.49 Mil/uL (ref 3.87–5.11)
RDW: 13.2 % (ref 11.5–15.5)
WBC: 10.6 10*3/uL — ABNORMAL HIGH (ref 4.0–10.5)

## 2016-07-20 LAB — TSH: TSH: 3.01 u[IU]/mL (ref 0.35–4.50)

## 2016-07-20 LAB — BRAIN NATRIURETIC PEPTIDE: PRO B NATRI PEPTIDE: 13 pg/mL (ref 0.0–100.0)

## 2016-07-20 LAB — SEDIMENTATION RATE: Sed Rate: 43 mm/hr — ABNORMAL HIGH (ref 0–30)

## 2016-07-20 NOTE — Progress Notes (Addendum)
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   07/20/2016 1st  Pulmonary office visit/ Ketura Sirek  On and off marcodantin 6 prescription filled in 2017  Chief Complaint  Patient presents with  . Pulmonary Consult    Referred by Dr. Woody Seller. Pt c/o SOB for the past 6 months. Prior to this, she had been walking 5 miles per day with no problem. Now she states that she is SOB "all day" with or without exertion. She states that she can not go to shopping or do any daily activites such as take a shower due to her breathing. She also c/o CP that comes and goes.    onset was aburpt early summer 2017 with exertion, then same day started having cp "different than the one she usually has with her panic disorder" started on L and spread to R and not reproduced with exertion   Sob is at rest ever since onset but worse with activity and has not resumed walking and gained 40 lb assoc with leg swelling  Sleeping poorly  ? Why x sev years then since onset even worse  Some sneezing in late summer / fall and pollen season but denies this flared with sob and no assoc cough / wheeze   No obvious day to day or daytime variability or assoc excess/ purulent sputum or mucus plugs or hemoptysis or cp or chest tightness, subjective wheeze or overt sinus or hb symptoms. No unusual exp hx or h/o childhood pna/ asthma or knowledge of premature birth.  Sleeping ok without nocturnal  or early am exacerbation  of respiratory  c/o's or need for noct  saba. Also denies any obvious fluctuation of symptoms with weather or environmental changes or other aggravating or alleviating factors except as outlined above   Current Medications, Allergies, Complete Past Medical History, Past Surgical History, Family History, and Social History were reviewed in [employer] record.  ROS  The following are not active complaints unless bolded sore throat, dysphagia, dental problems, itching, sneezing,  nasal congestion or excess/ purulent secretions, ear ache,   fever, chills, sweats, unintended wt loss, classically pleuritic or exertional cp,  orthopnea pnd or leg swelling, presyncope, palpitations, abdominal pain, anorexia, nausea, vomiting, diarrhea  or change in bowel or bladder habits, change in stools or urine, dysuria,hematuria,  rash, arthralgias, visual complaints, headache, numbness, weakness or ataxia or problems with walking or coordination,  change in mood/affect or memory.            Review of Systems     Objective:   Physical Exam    amb  Very anxious obese wf    Who failed to answer a single question asked in a straightforward manner, tending to go off on tangents or answer questions with ambiguous medical terms or diagnoses and  Or stated things like "of course I have allergies" of course I panic disorder and atypical cp"  Wt  Readings from Last 3 Encounters:  07/20/16 248 lb (112.5 kg)  03/11/16 227 lb (103 kg)  05/09/13 193 lb (87.5 kg)    Vital signs reviewed    HEENT: nl dentition, turbinates, and oropharynx. Nl external ear canals without cough reflex   NECK :  without JVD/Nodes/TM/ nl carotid upstrokes bilaterally   LUNGS: no acc muscle use,  Nl contour chest which is clear to A and P bilaterally without cough on insp or exp maneuvers   CV:  RRR  no s3 or murmur or increase in P2, trace bilateral pitting ankle edema   ABD:  soft and nontender with nl inspiratory excursion in the supine position. No  bruits or organomegaly, bowel sounds nl  MS:  Nl gait/ ext warm without deformities, calf tenderness, cyanosis or clubbing No obvious joint restrictions   SKIN: warm and dry without lesions    NEURO:  alert, approp, nl sensorium with  no motor deficits    CXR PA and Lateral:   07/20/2016 :    I personally reviewed images and agree with radiology impression as follows:   Normal heart size and mediastinal contours. No acute infiltrate or edema. No effusion or pneumothorax. Spondylosis. No acute osseous Finding.   Labs ordered/ reviewed:      Chemistry      Component Value Date/Time   NA 139 07/20/2016 1146   K 3.4 (L) 07/20/2016 1146   CL 100 07/20/2016 1146   CO2 31 07/20/2016 1146   BUN 18 07/20/2016 1146   CREATININE 0.91 07/20/2016 1146      Component Value Date/Time   CALCIUM 9.4 07/20/2016 1146   ALKPHOS 71 07/20/2016 1146   AST 20 07/20/2016 1146   ALT 23 07/20/2016 1146   BILITOT 0.3 07/20/2016 1146    Albumin       3.9                                  07/20/2016     Lab Results  Component Value Date   WBC 10.6 (H) 07/20/2016   HGB 12.7 07/20/2016   HCT 37.6 07/20/2016   MCV 83.7 07/20/2016   PLT 281.0 07/20/2016     Lab Results  Component Value Date   DDIMER 0.37 07/20/2016      Lab Results  Component Value Date   TSH 3.01 07/20/2016     Lab Results  Component Value Date   PROBNP 13.0 07/20/2016       Lab Results  Component Value Date   ESRSEDRATE 43 (H) 07/20/2016            Assessment:

## 2016-07-20 NOTE — Patient Instructions (Addendum)
Please remember to go to the lab and x-ray department downstairs for your tests - we will call you with the results when they are available.   We will arrange appropriate follow up studies once these results are reviewed  rec trial off marcodantin permanently and f/u pfts in 4 weeks

## 2016-07-21 LAB — RESPIRATORY ALLERGY PROFILE REGION II ~~LOC~~
Allergen, A. alternata, m6: <0.1 kU/L
Allergen, C. Herbarum, M2: <0.1 kU/L
Allergen, Comm Silver Birch, t9: <0.1 kU/L
Allergen, Mulberry, t76: <0.1 kU/L
Allergen, P. notatum, m1: <0.1 kU/L
Box Elder IgE: <0.1 kU/L
Cockroach: <0.1 kU/L
D. farinae: <0.1 kU/L
Dog Dander: <0.1 kU/L
Elm IgE: <0.1 kU/L
IgE (Immunoglobulin E), Serum: 23 kU/L (ref <115)
Johnson Grass: <0.1 kU/L
Rough Pigweed  IgE: <0.1 kU/L
Sheep Sorrel IgE: <0.1 kU/L

## 2016-07-21 LAB — D-DIMER, QUANTITATIVE: D-Dimer, Quant: 0.37 mcg/mL FEU (ref <0.50)

## 2016-07-21 NOTE — Assessment & Plan Note (Addendum)
-  last exposure to [facility] prior to onset of doe in May 2017 was 12/01/15 but 4 more rx's since onset of sob  last one 07/12/16 > d/c 07/20/2016  07/20/2016  Walked RA x 3 laps @ 185 ft each stopped due to  Endo of study, stas 94 brisk pace   Symptoms are markedly disproportionate to objective findings and not clear this is a lung problem but pt does appear to have difficult airway management issues. DDX of  difficult airways management almost all start with A and  include Adherence, Ace Inhibitors, Acid Reflux, Active Sinus Disease, Alpha 1 Antitripsin deficiency, Anxiety masquerading as Airways dz,  ABPA,  Allergy(esp in young), Aspiration (esp in elderly), Adverse effects of meds,  Active smokers, A bunch of PE's (a small clot burden can't cause this syndrome unless there is already severe underlying pulm or vascular dz with poor reserve) plus two Bs  = Bronchiectasis and Beta blocker use..and one C= CHF   Adherence is always the initial "prime suspect" and is a multilayered concern that requires a "trust but verify" approach in every patient - starting with knowing how to use medications, especially inhalers, correctly, keeping up with refills and understanding the fundamental difference between maintenance and prns vs those medications only taken for a very short course and then stopped and not refilled.   ? Acid (or non-acid) GERD > always difficult to exclude as up to 75% of pts in some series report no assoc GI/ Heartburn symptoms> rec continue max (24h)  acid suppression and diet restrictions/ reviewed     ? Anxiety > usually at the bottom of this list of usual suspects but should be much higher on this pt's based on H and P in particular sob at rest not reproduced with ex here and note already on psychotropics .    ? Allergy to marcodantin supported by elevated ESR rec permanently d/c and schedule full pfts in 4 weeks off   ? A bunch of PE's > D dimer nl - while  A nl valute  may miss  small peripheral pe, the clot burden with sob is moderately high and the d dimer has a very high neg pred value in this setting    ? chf /IHD  Note neg cards w/u in July 2017 and bnp <<100 so this can be excluded   Total time devoted to counseling  = 35/105mreview case with pt/husband discussion of options/alternatives/ personally creating written instructions  in presence of pt  then going over those specific  Instructions directly with the pt including how to use all of the meds but in particular covering each new medication in detail and the difference between the maintenance/automatic meds and the prns using an action plan format for the latter.    rec trial off marcodantin permanently and f/u pfts in 4 weeks       .

## 2016-07-22 ENCOUNTER — Telehealth: Payer: Self-pay | Admitting: Internal Medicine

## 2016-07-22 DIAGNOSIS — R0600 Dyspnea, unspecified: Secondary | ICD-10-CM

## 2016-07-22 NOTE — Telephone Encounter (Signed)
Spoke with the pt and notified of results of her labs and cxr  She refuses to make the 4 wk with PFT until we explain to her why she is swelling in her legs  Dr Melvyn Novas, please advise, thanks

## 2016-07-22 NOTE — Progress Notes (Signed)
Spoke with pt and notified of results per Dr. Wert. Pt verbalized understanding and denied any questions. 

## 2016-07-22 NOTE — Telephone Encounter (Signed)
Pt returning call.Kimberly Ortega ° °

## 2016-07-22 NOTE — Telephone Encounter (Signed)
After 3 attempts to call and getting recorder all 3 times I left a message for her that the fluid problem remain unexplained but not directly related to the breathing problem and she needs to go ahead and schedule the pfts due to macrodantin issue

## 2016-07-23 ENCOUNTER — Telehealth: Payer: Self-pay | Admitting: Internal Medicine

## 2016-07-23 NOTE — Telephone Encounter (Signed)
Called and spoke with pt and she stated that she did get the message from  [facility].  She was wanting to know if MW feels that she has something that cannot be reversed from taking the abx and wanted to know if the damage is something that you can die from?  MW pleas advise. Thanks  Allergies  Allergen Reactions  . Morphine And Related Nausea Only  . Tramadol     Low blood pressure

## 2016-07-23 NOTE — Telephone Encounter (Signed)
If this is from [facility] and she continues to take it, then yes it can be lethal but no evidence of severe dz here and usually when you stop the medication it either gets better or stays the same but doesn't get worse.  It is very serious though so needs to keep all appts for f/u

## 2016-07-23 NOTE — Telephone Encounter (Signed)
Per the 10.30.17 visit with MW: Patient Instructions  Please remember to go to the lab and x-ray department downstairs for your tests - we will call you with the results when they are available.   We will arrange appropriate follow up studies once these results are reviewed  rec trial off marcodantin permanently and f/u pfts in 4 weeks    Called spoke with patient and discussed MW's recommendations as stated below Pt voiced her understanding  She does have some additional questions regarding her Sed Rate results: where could the inflammation be coming from?  Is there anything she can do to help this level decrease?  Is there something specific that could be contributing to the elevated ESR?    Pt is aware MW is not in the office currently and is okay with a call back tomorrow.  MW please advise, thank you.  Erythrocyte Sedimentation Rate     Component Value Date/Time   ESRSEDRATE 43 (H) 07/20/2016 1146

## 2016-07-23 NOTE — Telephone Encounter (Signed)
Spoke with pt  She agrees to have PFT's  Nothing here for 4 wks  Order sent to [facility] for this to be done at [facility]

## 2016-07-24 NOTE — Telephone Encounter (Signed)
Most likely it's from the macrodantin, only way to know for sure  is leave it off and repeat the study when she returns for full pfts - that's why the rec on the result note says no change in recs from the ones I made in the office

## 2016-07-24 NOTE — Telephone Encounter (Signed)
Called and spoke with pt and she is aware of MW recs.  Nothing further is needed 

## 2016-08-24 ENCOUNTER — Ambulatory Visit (INDEPENDENT_AMBULATORY_CARE_PROVIDER_SITE_OTHER): Payer: 59 | Admitting: Internal Medicine

## 2016-08-24 ENCOUNTER — Encounter: Payer: Self-pay | Admitting: Internal Medicine

## 2016-08-24 ENCOUNTER — Ambulatory Visit (HOSPITAL_COMMUNITY)
Admission: RE | Admit: 2016-08-24 | Discharge: 2016-08-24 | Disposition: A | Discharge status: Home / Self Care | Payer: 59 | Source: Ambulatory Visit | Attending: Internal Medicine | Admitting: Internal Medicine

## 2016-08-24 ENCOUNTER — Other Ambulatory Visit (INDEPENDENT_AMBULATORY_CARE_PROVIDER_SITE_OTHER): Payer: 59

## 2016-08-24 VITALS — BP 130/80 | HR 83 | Ht 66.75 in | Wt 245.0 lb

## 2016-08-24 DIAGNOSIS — R0600 Dyspnea, unspecified: Secondary | ICD-10-CM

## 2016-08-24 LAB — PULMONARY FUNCTION TEST
DL/VA % PRED: 103 %
DL/VA: 5.22 ml/min/mmHg/L
DLCO unc % pred: 106 %
DLCO unc: 28.63 ml/min/mmHg
FEF 25-75 Post: 4.76 L/sec
FEF 25-75 Pre: 4.63 L/sec
FEF2575-%Change-Post: 2 %
FEF2575-%Pred-Post: 168 %
FEF2575-%Pred-Pre: 164 %
FEV1-%Change-Post: 1 %
FEV1-%PRED-PRE: 104 %
FEV1-%Pred-Post: 106 %
FEV1-PRE: 3.11 L
FEV1-Post: 3.17 L
FEV1FVC-%CHANGE-POST: 2 %
FEV1FVC-%Pred-Pre: 110 %
FEV6-%Change-Post: 0 %
FEV6-%PRED-PRE: 95 %
FEV6-%Pred-Post: 95 %
FEV6-PRE: 3.51 L
FEV6-Post: 3.49 L
FEV6FVC-%PRED-PRE: 103 %
FEV6FVC-%Pred-Post: 103 %
FVC-%Change-Post: 0 %
FVC-%PRED-PRE: 93 %
FVC-%Pred-Post: 92 %
FVC-POST: 3.49 L
FVC-PRE: 3.51 L
POST FEV6/FVC RATIO: 100 %
Post FEV1/FVC ratio: 91 %
Pre FEV1/FVC ratio: 89 %
Pre FEV6/FVC Ratio: 100 %
RV % pred: 85 %
RV: 1.62 L
TLC % PRED: 100 %
TLC: 5.4 L

## 2016-08-24 LAB — SEDIMENTATION RATE: SED RATE: 42 mm/h — AB (ref 0–30)

## 2016-08-24 MED ORDER — ALBUTEROL SULFATE (2.5 MG/3ML) 0.083% IN NEBU
2.5000 mg | INHALATION_SOLUTION | Freq: Once | RESPIRATORY_TRACT | Status: AC
Start: 2016-08-24 — End: 2016-08-24
  Administered 2016-08-24: 2.5 mg via RESPIRATORY_TRACT

## 2016-08-24 NOTE — Progress Notes (Signed)
Spoke with pt and notified of results per Dr. Wert. Pt verbalized understanding and denied any questions. 

## 2016-08-24 NOTE — Patient Instructions (Addendum)
Avoid all oil based vitamins - use powdered substitutes and eat more fish to replace the fish oil   Please remember to go to the lab  department downstairs for your tests - we will call you with the results when they are available.  Weight control is simply a matter of calorie balance which needs to be tilted in your favor by eating less and exercising more.  To get the most out of exercise, you need to be continuously aware that you are short of breath, but never out of breath, for 30 minutes daily. As you improve, it will actually be easier for you to do the same amount of exercise  in  30 minutes so always push to the level where you are short of breath.  If this does not result in gradual weight reduction then I strongly recommend you see a nutritionist with a food diary x 2 weeks so that we can work out a negative calorie balance which is universally effective in steady weight loss programs.  Think of your calorie balance like you do your bank account where in this case you want the balance to go down so you must take in less calories than you burn up.  It's just that simple:  Hard to do, but easy to understand.  Good luck!   If not happy with progress with your breathing call us back after the holidays to schedule CPST

## 2016-08-24 NOTE — Progress Notes (Addendum)
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    History of Present Illness  07/20/2016 1st [facility] office visit/ Kimberly Ortega  On and off marcodantin 6 prescription filled in 2017  Chief Complaint  Patient presents with  . Pulmonary Consult    Referred by Dr. Woody Seller. Pt c/o SOB for the past 6 months. Prior to this, she had been walking 5 miles per day with no problem. Now she states that she is SOB "all day" with or without exertion. She states that she can not go to shopping or do any daily activites such as take a shower due to her breathing. She also c/o CP that comes and goes.    onset was aburpt late Spring 2017 with exertion, then same day started having cp "different than the one she usually has with her panic disorder" started on L and spread to R and not reproduced with exertion  Sob is at rest ever since onset but worse with activity and has not resumed walking and gained 40 lb assoc with leg swelling Sleeping poorly  ? Why x sev years then since onset even worse Some sneezing in late summer / fall and pollen season but denies this flared with sob and no assoc cough / wheeze rec Stop all macrodantin- last dose in Oct 2017  / return for pfts    08/24/2016  f/u [name] re: unexplained sob present at rest/ worse when walk  Chief Complaint  Patient presents with  . Follow-up    PFT's done today. Pt states that her breathing is slightly worse since the last visit. She states she can not  "do anything" due to her SOB.   continues to report mild sob at rest/ worse with walking across a room, has not resumed any kind of regular walking  No obvious day to day or daytime variability or assoc chronic cough or cp or chest tightness, subjective wheeze or overt sinus or hb symptoms. No unusual exp hx or h/o childhood pna/ asthma or knowledge of premature birth.  Sleeping ok without nocturnal  or early am exacerbation  of respiratory  c/o's or need for noct saba. Also denies any obvious fluctuation of symptoms with weather or environmental changes or other aggravating or alleviating factors except as outlined above   Current Medications, Allergies, Complete Past Medical History, Past Surgical History, Family History, and Social History were reviewed in [employer] record.  ROS  The following are not active complaints unless bolded sore throat, dysphagia, dental problems, itching, sneezing,  nasal congestion or excess/ purulent secretions, ear ache,   fever, chills, sweats, unintended wt loss, classically pleuritic or exertional cp, hemoptysis,  orthopnea pnd or leg swelling, presyncope, palpitations, abdominal pain, anorexia, nausea, vomiting, diarrhea  or change in bowel or bladder habits, change in stools or urine, dysuria,hematuria,  rash, arthralgias, visual complaints, headache, numbness, weakness or ataxia or  problems with walking or coordination,  change in mood/affect or memory.           Objective:   Physical Exam    amb   anxious obese wf  Somewhat of a hopeless affect      08/24/2016       245   07/20/16 248 lb (112.5 kg)  03/11/16 227 lb (103 kg)  05/09/13 193 lb (87.5 kg)    Vital signs reviewed - Note on arrival 02 sats  97% on RA     HEENT: nl dentition, turbinates, and oropharynx. Nl external ear canals without cough reflex   NECK :  without JVD/Nodes/TM/ nl carotid upstrokes bilaterally   LUNGS: no acc muscle use,  Nl contour chest  which is clear to A and P bilaterally without cough on insp or exp maneuvers   CV:  RRR  no s3 or murmur or increase in P2, no longer any significnat pitting ankle edema   ABD:  soft and nontender with nl inspiratory excursion in the supine position. No bruits or organomegaly, bowel sounds nl  MS:  Nl gait/ ext warm without deformities, calf tenderness, cyanosis or clubbing No obvious joint restrictions   SKIN: warm and dry without lesions    NEURO:  alert, approp, nl sensorium with  no motor deficits       Lab Results  Component Value Date   ESRSEDRATE 42 (H) 08/24/2016   ESRSEDRATE 43 (H) 07/20/2016       Lab Results  Component Value Date   DDIMER 0.37 07/20/2016         Assessment:

## 2016-08-25 NOTE — Assessment & Plan Note (Addendum)
-  last exposure to [facility] prior to onset of doe in May 2017 was 12/01/15 but 4 more rx's since onset of sob  last one 07/12/16 > d/c 07/20/2016  07/20/2016  Walked RA x 3 laps @ 185 ft each stopped due to  Endo of study, stas 94 brisk pace - Allergy profile 07/20/16 >  Eos 0. /  IgE  23 neg RAST - PFT's  08/24/2016  FVC 3.51(100%) no obst  p nothing prior to study with DLCO  106 % corrects to 103  % for alv volume  With ERV 29%  - 08/24/2016  Walked RA x 3 laps @ 185 ft each stopped due to end of study, rapid pace, min sob, sats up to 98%   I had an extended final summary discussion with the patient/husband reviewing all relevant studies completed to date and  lasting 15 to 20 minutes of a 25 minute visit on the following issues:    All signs point to a mild marcodantin reaction that has improved though note the ESR has not normalized  Mostly what she has now is obesity/ deconditioning  Reviewed reconditioning / need for cpst after the holidays if not improving with regular exercise and GERD Diet also reviewed

## 2016-08-28 ENCOUNTER — Telehealth: Payer: Self-pay | Admitting: Internal Medicine

## 2016-08-28 NOTE — Telephone Encounter (Signed)
Called and spoke with pt and she stated that MW told her that a certain abx could kill her if she took it.  She stated that she wanted to call and make sure that she was able to take monurol 3 grams.  MW please advise. Thanks  Allergies  Allergen Reactions  . Morphine And Related Nausea Only  . Tramadol     Low blood pressure

## 2016-08-28 NOTE — Telephone Encounter (Signed)
Called and spoke with pt and she is aware of MW recs.  Nothing further is needed 

## 2016-08-28 NOTE — Telephone Encounter (Signed)
Macrodantin (nitrofurantoin/ marcobid)  Monurol looks ok on the info I have

## 2016-12-11 ENCOUNTER — Ambulatory Visit: Payer: 59 | Admitting: Gastroenterology

## 2016-12-16 ENCOUNTER — Encounter (INDEPENDENT_AMBULATORY_CARE_PROVIDER_SITE_OTHER): Payer: Self-pay | Admitting: Internal Medicine

## 2016-12-28 ENCOUNTER — Ambulatory Visit (INDEPENDENT_AMBULATORY_CARE_PROVIDER_SITE_OTHER): Payer: 59 | Admitting: Internal Medicine

## 2017-03-01 ENCOUNTER — Encounter (INDEPENDENT_AMBULATORY_CARE_PROVIDER_SITE_OTHER): Payer: Self-pay | Admitting: *Deleted

## 2017-03-01 ENCOUNTER — Encounter (INDEPENDENT_AMBULATORY_CARE_PROVIDER_SITE_OTHER): Payer: Self-pay | Admitting: Internal Medicine

## 2017-03-01 ENCOUNTER — Encounter (INDEPENDENT_AMBULATORY_CARE_PROVIDER_SITE_OTHER): Payer: Self-pay

## 2017-03-01 ENCOUNTER — Ambulatory Visit (INDEPENDENT_AMBULATORY_CARE_PROVIDER_SITE_OTHER): Payer: 59 | Admitting: Internal Medicine

## 2017-03-01 ENCOUNTER — Other Ambulatory Visit (INDEPENDENT_AMBULATORY_CARE_PROVIDER_SITE_OTHER): Payer: Self-pay | Admitting: Internal Medicine

## 2017-03-01 VITALS — BP 130/84 | HR 64 | Temp 98.1°F | Ht 66.75 in | Wt 232.9 lb

## 2017-03-01 DIAGNOSIS — R635 Abnormal weight gain: Secondary | ICD-10-CM | POA: Insufficient documentation

## 2017-03-01 DIAGNOSIS — R109 Unspecified abdominal pain: Secondary | ICD-10-CM | POA: Insufficient documentation

## 2017-03-01 DIAGNOSIS — K219 Gastro-esophageal reflux disease without esophagitis: Secondary | ICD-10-CM | POA: Diagnosis not present

## 2017-03-01 DIAGNOSIS — K3184 Gastroparesis: Secondary | ICD-10-CM | POA: Insufficient documentation

## 2017-03-01 DIAGNOSIS — R1012 Left upper quadrant pain: Secondary | ICD-10-CM | POA: Insufficient documentation

## 2017-03-01 HISTORY — DX: Gastro-esophageal reflux disease without esophagitis: K21.9

## 2017-03-01 NOTE — Patient Instructions (Signed)
EGD. The risks of bleeding, perforation and infection were reviewed with patient.  

## 2017-03-01 NOTE — Progress Notes (Signed)
Subjective:    Patient ID: Kimberly Ortega, female    DOB: 06/02/1966, 51 y.o.   MRN: [id number]  HPI   Referred by Dr. Woody Seller for GERD. She states she presents today with co upper epigastric pain. Pain and reflux for about 8 months. She says she has just dealt with. She saw PCP and placed on Dexilant 2 months ago. She is not completely controlled.  Rates the pain 4/10.  Worried she may have some erosions.  Would like to proceed with an EGD.  Appetite is good. She is dieting. Has lost about 18 pounds over the past 3 months. No NSAIDs.    11/21/2012 Procedure:      Upper endoscopy, laparoscopy, removal of lapband, placement of 19 blake drain. Diagnosis:       Lapband erosion  07/08/2011  Dr. Hassell Done PREOPERATIVE DIAGNOSIS:  BMI 41 with gastroesophageal reflux disease.  PROCEDURE:  Laparoscopic adjustable gastric banding APS with 2-suture posterior hiatal hernia repair following balloon calibration.    Review of Systems Past Medical History:  Diagnosis Date  . Abdominal pain   . Anemia   . Anxiety   . Arthritis   . Essential hypertension   . Family history of breast cancer    Cousins on both sides of family  . GERD (gastroesophageal reflux disease)   . History of melanoma   . History of recurrent UTIs   . Migraine   . Obstructive sleep apnea   . Polycystic ovarian disease     Past Surgical History:  Procedure Laterality Date  . CHOLECYSTECTOMY  1991  . DILATION AND CURETTAGE OF UTERUS    . LAPAROSCOPIC GASTRIC BANDING  07/07/11  . LAPAROSCOPIC REPAIR AND REMOVAL OF GASTRIC BAND    . UPPER GI ENDOSCOPY N/A 11/21/2012   Procedure: UPPER GI ENDOSCOPY Ileene Hutchinson DIAGNOSTIC LAPAROSCOPY /REMOVAL LAP GASTRIC BAND/INSERTION OF DRAIN;  Surgeon: Pedro Earls, MD;  Location: [facility];  Service: General;  Laterality: N/A;    Allergies  Allergen Reactions  . Macrobid [Nitrofurantoin Macrocrystal]     ?  Marland Kitchen Morphine And Related Nausea Only  . Tramadol     Low blood pressure     Current Outpatient Prescriptions on File Prior to Visit  Medication Sig Dispense Refill  . ALPRAZolam (XANAX) 1 MG tablet Take 1 mg by mouth 3 (three) times daily as needed for anxiety.     . Ca Phosphate-Cholecalciferol (CALTRATE GUMMY BITES) 250-400 MG-UNIT CHEW Chew by mouth daily.    . hydrochlorothiazide (HYDRODIURIL) 12.5 MG tablet Take 12.5 mg by mouth daily.    Marland Kitchen loratadine (CLARITIN) 10 MG tablet Take 10 mg by mouth daily as needed for allergies.    . Multiple Vitamin (MULTIVITAMIN WITH MINERALS) TABS Take 1 tablet by mouth daily.    Marland Kitchen OVER THE COUNTER MEDICATION Take 1 tablet by mouth daily. Probiotic colon health     No current facility-administered medications on file prior to visit.         Objective:   Physical Exam Blood pressure 130/84, pulse 64, temperature 98.1 F (36.7 C), height 5' 6.75" (1.695 m), weight 232 lb 14.4 oz (105.6 kg). Alert and oriented. Skin warm and dry. Oral mucosa is moist.   . Sclera anicteric, conjunctivae is pink. Thyroid not enlarged. No cervical lymphadenopathy. Lungs clear. Heart regular rate and rhythm.  Abdomen is soft. Bowel sounds are positive. No hepatomegaly. No abdominal masses felt.Slight epigastric tenderness.  No edema to lower extremities.  Assessment & Plan:  GERD:  EGD. PUD needs to be ruled out.  The risks of bleeding, perforation and infection were reviewed with patient.

## 2017-03-04 ENCOUNTER — Ambulatory Visit (HOSPITAL_COMMUNITY)
Admission: RE | Admit: 2017-03-04 | Discharge: 2017-03-04 | Disposition: A | Discharge status: Home / Self Care | Payer: 59 | Source: Ambulatory Visit | Attending: Internal Medicine | Admitting: Internal Medicine

## 2017-03-04 DIAGNOSIS — R109 Unspecified abdominal pain: Secondary | ICD-10-CM | POA: Insufficient documentation

## 2017-03-04 DIAGNOSIS — Z9049 Acquired absence of other specified parts of digestive tract: Secondary | ICD-10-CM | POA: Insufficient documentation

## 2017-04-09 ENCOUNTER — Encounter (HOSPITAL_COMMUNITY)
Admission: RE | Disposition: A | Discharge status: Home / Self Care | Payer: Self-pay | Source: Ambulatory Visit | Attending: Internal Medicine

## 2017-04-09 ENCOUNTER — Ambulatory Visit (HOSPITAL_COMMUNITY)
Admission: RE | Admit: 2017-04-09 | Discharge: 2017-04-09 | Disposition: A | Discharge status: Home / Self Care | Payer: 59 | Source: Ambulatory Visit | Attending: Internal Medicine | Admitting: Internal Medicine

## 2017-04-09 ENCOUNTER — Encounter (HOSPITAL_COMMUNITY): Payer: Self-pay | Admitting: *Deleted

## 2017-04-09 DIAGNOSIS — R1010 Upper abdominal pain, unspecified: Secondary | ICD-10-CM | POA: Diagnosis not present

## 2017-04-09 DIAGNOSIS — Z79899 Other long term (current) drug therapy: Secondary | ICD-10-CM | POA: Insufficient documentation

## 2017-04-09 DIAGNOSIS — Z85820 Personal history of malignant melanoma of skin: Secondary | ICD-10-CM | POA: Insufficient documentation

## 2017-04-09 DIAGNOSIS — M1990 Unspecified osteoarthritis, unspecified site: Secondary | ICD-10-CM | POA: Insufficient documentation

## 2017-04-09 DIAGNOSIS — G4733 Obstructive sleep apnea (adult) (pediatric): Secondary | ICD-10-CM | POA: Insufficient documentation

## 2017-04-09 DIAGNOSIS — Z881 Allergy status to other antibiotic agents status: Secondary | ICD-10-CM | POA: Diagnosis not present

## 2017-04-09 DIAGNOSIS — Z886 Allergy status to analgesic agent status: Secondary | ICD-10-CM | POA: Diagnosis not present

## 2017-04-09 DIAGNOSIS — Z885 Allergy status to narcotic agent status: Secondary | ICD-10-CM | POA: Diagnosis not present

## 2017-04-09 DIAGNOSIS — K2950 Unspecified chronic gastritis without bleeding: Secondary | ICD-10-CM | POA: Diagnosis not present

## 2017-04-09 DIAGNOSIS — Z9884 Bariatric surgery status: Secondary | ICD-10-CM | POA: Insufficient documentation

## 2017-04-09 DIAGNOSIS — K219 Gastro-esophageal reflux disease without esophagitis: Secondary | ICD-10-CM | POA: Diagnosis not present

## 2017-04-09 DIAGNOSIS — K228 Other specified diseases of esophagus: Secondary | ICD-10-CM | POA: Insufficient documentation

## 2017-04-09 DIAGNOSIS — K2970 Gastritis, unspecified, without bleeding: Secondary | ICD-10-CM | POA: Diagnosis not present

## 2017-04-09 DIAGNOSIS — R109 Unspecified abdominal pain: Secondary | ICD-10-CM | POA: Insufficient documentation

## 2017-04-09 DIAGNOSIS — Z87440 Personal history of urinary (tract) infections: Secondary | ICD-10-CM | POA: Insufficient documentation

## 2017-04-09 DIAGNOSIS — K3184 Gastroparesis: Secondary | ICD-10-CM | POA: Diagnosis not present

## 2017-04-09 DIAGNOSIS — I10 Essential (primary) hypertension: Secondary | ICD-10-CM | POA: Diagnosis not present

## 2017-04-09 DIAGNOSIS — F419 Anxiety disorder, unspecified: Secondary | ICD-10-CM | POA: Diagnosis not present

## 2017-04-09 HISTORY — DX: Adverse effect of unspecified anesthetic, initial encounter: T41.45XA

## 2017-04-09 HISTORY — PX: ESOPHAGOGASTRODUODENOSCOPY: SHX5428

## 2017-04-09 HISTORY — DX: Other complications of anesthesia, initial encounter: T88.59XA

## 2017-04-09 HISTORY — PX: BIOPSY: SHX5522

## 2017-04-09 SURGERY — EGD (ESOPHAGOGASTRODUODENOSCOPY)
Anesthesia: Moderate Sedation

## 2017-04-09 MED ORDER — MIDAZOLAM HCL 5 MG/5ML IJ SOLN
INTRAMUSCULAR | Status: DC | PRN
  Administered 2017-04-09: 3 mg via INTRAVENOUS
  Administered 2017-04-09 (×3): 2 mg via INTRAVENOUS

## 2017-04-09 MED ORDER — MIDAZOLAM HCL 5 MG/5ML IJ SOLN
INTRAMUSCULAR | Status: AC
  Filled 2017-04-09: qty 10

## 2017-04-09 MED ORDER — SODIUM CHLORIDE 0.9% FLUSH
INTRAVENOUS | Status: AC
  Filled 2017-04-09: qty 10

## 2017-04-09 MED ORDER — MEPERIDINE HCL 50 MG/ML IJ SOLN
INTRAMUSCULAR | Status: DC | PRN
  Administered 2017-04-09 (×2): 25 mg via INTRAVENOUS

## 2017-04-09 MED ORDER — MEPERIDINE HCL 50 MG/ML IJ SOLN
INTRAMUSCULAR | Status: AC
  Filled 2017-04-09: qty 1

## 2017-04-09 MED ORDER — PROMETHAZINE HCL 25 MG/ML IJ SOLN
INTRAMUSCULAR | Status: DC | PRN
  Administered 2017-04-09: 12.5 mg via INTRAVENOUS

## 2017-04-09 MED ORDER — STERILE WATER FOR IRRIGATION IR SOLN
Status: DC | PRN
  Administered 2017-04-09: 10:00:00

## 2017-04-09 MED ORDER — METOCLOPRAMIDE HCL 10 MG PO TABS: 10.0000 mg | ORAL_TABLET | Freq: Three times a day (TID) | ORAL | 0 refills | Status: DC

## 2017-04-09 MED ORDER — LIDOCAINE VISCOUS 2 % MT SOLN
OROMUCOSAL | Status: AC
  Filled 2017-04-09: qty 15

## 2017-04-09 MED ORDER — SODIUM CHLORIDE 0.9 % IV SOLN
INTRAVENOUS | Status: DC
  Administered 2017-04-09: 09:00:00 via INTRAVENOUS

## 2017-04-09 MED ORDER — PROMETHAZINE HCL 25 MG/ML IJ SOLN
INTRAMUSCULAR | Status: AC
  Filled 2017-04-09: qty 1

## 2017-04-09 NOTE — Discharge Instructions (Signed)
Resume usual medications. Clear liquids for 24 hours and then low-fat diet. Metoclopramide 10 mg by mouth 30 minutes before each meal. Stop this medication if you experience any side effects and call office. No driving for 24 hours. Physician will call with biopsy results and further recommendations.  Esophagogastroduodenoscopy, Care After Refer to this sheet in the next few weeks. These instructions provide you with information about caring for yourself after your procedure. Your health care provider may also give you more specific instructions. Your treatment has been planned according to current medical practices, but problems sometimes occur. Call your health care provider if you have any problems or questions after your procedure. What can I expect after the procedure? After the procedure, it is common to have:  A sore throat.  Nausea.  Bloating.  Dizziness.  Fatigue.  Follow these instructions at home:  Do not eat or drink anything until the numbing medicine (local anesthetic) has worn off and your gag reflex has returned. You will know that the local anesthetic has worn off when you can swallow comfortably.  Do not drive for 24 hours if you received a medicine to help you relax (sedative).  If your health care provider took a tissue sample for testing during the procedure, make sure to get your test results. This is your responsibility. Ask your health care provider or the department performing the test when your results will be ready.  Keep all follow-up visits as told by your health care provider. This is important. Contact a health care provider if:  You cannot stop coughing.  You are not urinating.  You are urinating less than usual. Get help right away if:  You have trouble swallowing.  You cannot eat or drink.  You have throat or chest pain that gets worse.  You are dizzy or light-headed.  You faint.  You have nausea or vomiting.  You have  chills.  You have a fever.  You have severe abdominal pain.  You have black, tarry, or bloody stools. This information is not intended to replace advice given to you by your health care provider. Make sure you discuss any questions you have with your health care provider. Document Released: 08/24/2012 Document Revised: 02/13/2016 Document Reviewed: 08/01/2015 Elsevier Interactive Patient Education  [employer].

## 2017-04-09 NOTE — H&P (Signed)
Kimberly Ortega is an 51 y.o. female.   Chief Complaint: Patient is here for EGD. HPI: Patient is 51 year old Caucasian female who presents with month history of pain across her upper abdomen. Pain seemed to be worse with meals. She denies nausea vomiting. She also been having frequent heartburn but she is at least 50% better with Dexilant. She previously wasn't omeprazole and it stopped working. She had lap band placed in October 2012. Band was removed in March 2014 because it eroded into the stomach. He denies melena or rectal bleeding. She does not take OTC NSAIDs. She drinks alcohol occasionally but does not smoke cigarettes.  Past Medical History:  Diagnosis Date  . Abdominal pain   . Anemia   . Anxiety   . Arthritis   . Complication of anesthesia    dizziness; almost passed out  . Essential hypertension   . Family history of breast cancer    Cousins on both sides of family  . GERD (gastroesophageal reflux disease)   . GERD (gastroesophageal reflux disease) 03/01/2017  . History of melanoma   . History of recurrent UTIs   . Migraine   . Obstructive sleep apnea   . Polycystic ovarian disease     Past Surgical History:  Procedure Laterality Date  . CHOLECYSTECTOMY  1991  . DILATION AND CURETTAGE OF UTERUS    . LAPAROSCOPIC GASTRIC BANDING  07/07/11  . LAPAROSCOPIC REPAIR AND REMOVAL OF GASTRIC BAND    . UPPER GI ENDOSCOPY N/A 11/21/2012   Procedure: UPPER GI ENDOSCOPY Ileene Hutchinson DIAGNOSTIC LAPAROSCOPY /REMOVAL LAP GASTRIC BAND/INSERTION OF DRAIN;  Surgeon: Pedro Earls, MD;  Location: [facility];  Service: General;  Laterality: N/A;    Family History  Problem Relation Age of Onset  . Cervical cancer Mother   . Stroke Mother   . Aneurysm Mother   . Cervical cancer Sister   . Breast cancer Cousin        Both sides of family   Social History:  reports that she has never smoked. She has never used smokeless tobacco. She reports that she does not drink alcohol or use  drugs.  Allergies:  Allergies  Allergen Reactions  . Macrobid [Nitrofurantoin Macrocrystal] Other (See Comments)    Lung specialist Told it would kill her   . Ibuprofen Other (See Comments)    Lung specialist told her not to take   . Morphine And Related Nausea Only  . Tramadol     Low blood pressure    Medications Prior to Admission  Medication Sig Dispense Refill  . ALPRAZolam (XANAX) 1 MG tablet Take 1 mg by mouth 3 (three) times daily as needed for anxiety.     . Biotin w/ Vitamins C & E (HAIR/SKIN/NAILS PO) Take 3 each by mouth daily. Chewable    . Calcium Carbonate-Vitamin D (CALCIUM PLUS VITAMIN D PO) Take 2 each by mouth daily. Chewables    . clindamycin-benzoyl peroxide (BENZACLIN) gel Apply 1 application topically at bedtime as needed. Acne  0  . dexlansoprazole (DEXILANT) 60 MG capsule Take 60 mg by mouth daily.    . hydrochlorothiazide (MICROZIDE) 12.5 MG capsule Take 12.5 mg by mouth daily.    Marland Kitchen loratadine (CLARITIN) 10 MG tablet Take 10 mg by mouth daily as needed for allergies.    Marland Kitchen MONUROL 3 g PACK Take 3 g by mouth daily as needed. Urinary tract infection  0  . Multiple Vitamins-Minerals (MULTIVITAMIN GUMMIES ADULT PO) Take 2 each by mouth daily.    Marland Kitchen  OVER THE COUNTER MEDICATION Take 2 capsules by mouth 2 (two) times daily before a meal. Slim vance    . OVER THE COUNTER MEDICATION Take 2 capsules by mouth 2 (two) times daily before a meal. CLO for belly fat    . polyethylene glycol powder (GLYCOLAX/MIRALAX) powder Take 17 g by mouth daily as needed for constipation.  0  . Probiotic Product (PROBIOTIC PO) Take 3 each by mouth daily.    Marland Kitchen tobramycin (TOBREX) 0.3 % ophthalmic solution Place 1 drop into both eyes 2 (two) times daily as needed. Eye irritation  0  . UNABLE TO FIND Take 2 tablets by mouth 2 (two) times daily. Slimvance      No results found for this or any previous visit (from the past 48 hour(s)). No results found.  ROS  Blood pressure 126/76, pulse  64, temperature 98.6 F (37 C), temperature source Oral, resp. rate 14, height 5\' 7"  (1.702 m), weight 232 lb (105.2 kg), last menstrual period 03/27/2017, SpO2 100 %. Physical Exam  Constitutional: She appears well-developed and well-nourished.  HENT:  Mouth/Throat: Oropharynx is clear and moist.  Eyes: Conjunctivae are normal. No scleral icterus.  Neck: No thyromegaly present.  Cardiovascular: Normal rate, regular rhythm and normal heart sounds.   No murmur heard. Respiratory: Effort normal and breath sounds normal.  GI:  Abdomen is full. It is soft with mild tenderness below both costal margins as well as epigastrium. No organomegaly or masses.  Musculoskeletal: She exhibits no edema.  Lymphadenopathy:    She has no cervical adenopathy.  Neurological: She is alert.  Skin: Skin is warm and dry.     Assessment/Plan Chronic upper abdominal pain. Chronic GERD. Diagnostic EGD.  Hildred Laser, MD 04/09/2017, 9:34 AM

## 2017-04-09 NOTE — Op Note (Signed)
[facility] Patient Name: Kimberly Ortega Procedure Date: 04/09/2017 9:07 AM MRN: [id number] Date of Birth: 06/02/1966 Attending MD: Hildred Laser , MD CSN: [id number] Age: 51 Admit Type: Outpatient Procedure:                Upper GI endoscopy Indications:              Upper abdominal pain Providers:                Hildred Laser, MD, Otis Peak B. Sharon Seller, RN, Aram Candela Referring MD:             Glenda Chroman, MD Medicines:                Promethazine 12.5 mg IV, Meperidine 50 mg IV,                            Midazolam 7 mg IV Complications:            No immediate complications. Estimated Blood Loss:     Estimated blood loss was minimal. Procedure:                Pre-Anesthesia Assessment:                           - Prior to the procedure, a History and Physical                            was performed, and patient medications and                            allergies were reviewed. The patient's tolerance of                            previous anesthesia was also reviewed. The risks                            and benefits of the procedure and the sedation                            options and risks were discussed with the patient.                            All questions were answered, and informed consent                            was obtained. Prior Anticoagulants: The patient has                            taken no previous anticoagulant or antiplatelet                            agents. ASA Grade Assessment: II - A patient with  mild systemic disease. After reviewing the risks                            and benefits, the patient was deemed in                            satisfactory condition to undergo the procedure.                           After obtaining informed consent, the endoscope was                            passed under direct vision. Throughout the                            procedure, the patient's blood  pressure, pulse, and                            oxygen saturations were monitored continuously. The                            [id number] ([id number]) scope was introduced through the                            mouth, and advanced to the second part of duodenum.                            The upper GI endoscopy was accomplished without                            difficulty. The patient tolerated the procedure                            well. Scope In: 9:48:25 AM Scope Out: 9:57:50 AM Total Procedure Duration: 0 hours 9 minutes 25 seconds  Findings:      The examined esophagus was normal.      The Z-line was irregular and was found 40 cm from the incisors. Biopsies       were taken with a cold forceps for histology. The pathology specimen was       placed into Bottle Number 2.      A large amount of food (residue) was found in the gastric body and in       the prepyloric region of the stomach.      Patchy minimal inflammation characterized by congestion (edema) and       erythema was found in the gastric antrum. Biopsies were taken with a       cold forceps for histology. The pathology specimen was placed into       Bottle Number 1.      The cardia, gastric fundus and pylorus were normal.      The duodenal bulb and second portion of the duodenum were normal. Impression:               - Normal esophagus.                           -  Z-line irregular, 40 cm from the incisors.                            Biopsied.                           - A large amount of food (residue) in the stomach                            with patent pylorus tive of gastroparesis.                           - Gastritis. Biopsied.                           - Normal duodenal bulb and second portion of the                            duodenum. Moderate Sedation:      Moderate (conscious) sedation was administered by the endoscopy nurse       and supervised by the endoscopist. The following parameters were       monitored:  oxygen saturation, heart rate, blood pressure, CO2       capnography and response to care. Total physician intraservice time was       17 minutes. Recommendation:           - Patient has a contact number available for                            emergencies. The signs and symptoms of potential                            delayed complications were discussed with the                            patient. Return to normal activities tomorrow.                            Written discharge instructions were provided to the                            patient.                           - Clear liquid diet today.                           - Continue present medications.                           - Await pathology results.                           - Metoclopramide 10 mg by mouth 30 minutes before  each meal. Procedure Code(s):        --- Professional ---                           [phone], Esophagogastroduodenoscopy, flexible,                            transoral; with biopsy, single or multiple                           99152, Moderate sedation services provided by the                            same physician or other qualified health care                            professional performing the diagnostic or                            therapeutic service that the sedation supports,                            requiring the presence of an independent trained                            observer to assist in the monitoring of the                            patient's level of consciousness and physiological                            status; initial 15 minutes of intraservice time,                            patient age 39 years or older Diagnosis Code(s):        --- Professional ---                           K22.8, Other specified diseases of esophagus                           K29.70, Gastritis, unspecified, without bleeding                           R10.10, Upper abdominal pain,  unspecified CPT copyright 2016 American Medical Association. All rights reserved. The codes documented in this report are preliminary and upon coder review may  be revised to meet current compliance requirements. Hildred Laser, MD Hildred Laser, MD 04/09/2017 10:08:39 AM This report has been signed electronically. Number of Addenda: 0

## 2017-04-14 ENCOUNTER — Encounter (HOSPITAL_COMMUNITY): Payer: Self-pay | Admitting: Internal Medicine

## 2017-04-16 ENCOUNTER — Other Ambulatory Visit (INDEPENDENT_AMBULATORY_CARE_PROVIDER_SITE_OTHER): Payer: Self-pay | Admitting: Internal Medicine

## 2017-04-16 DIAGNOSIS — G8929 Other chronic pain: Secondary | ICD-10-CM

## 2017-04-16 DIAGNOSIS — R1013 Epigastric pain: Principal | ICD-10-CM

## 2017-04-22 ENCOUNTER — Encounter (HOSPITAL_COMMUNITY)
Admission: RE | Admit: 2017-04-22 | Discharge: 2017-04-22 | Disposition: A | Discharge status: Home / Self Care | Payer: 59 | Source: Ambulatory Visit | Attending: Internal Medicine | Admitting: Internal Medicine

## 2017-04-22 ENCOUNTER — Encounter (HOSPITAL_COMMUNITY): Payer: Self-pay

## 2017-04-22 DIAGNOSIS — R1013 Epigastric pain: Secondary | ICD-10-CM | POA: Insufficient documentation

## 2017-04-22 DIAGNOSIS — G8929 Other chronic pain: Secondary | ICD-10-CM | POA: Diagnosis present

## 2017-04-22 MED ORDER — TECHNETIUM TC 99M SULFUR COLLOID
2.0000 | Freq: Once | INTRAVENOUS | Status: AC | PRN
  Administered 2017-04-22: 2.1 via ORAL

## 2017-04-29 ENCOUNTER — Other Ambulatory Visit (INDEPENDENT_AMBULATORY_CARE_PROVIDER_SITE_OTHER): Payer: Self-pay | Admitting: Internal Medicine

## 2017-04-30 ENCOUNTER — Other Ambulatory Visit (INDEPENDENT_AMBULATORY_CARE_PROVIDER_SITE_OTHER): Payer: Self-pay | Admitting: *Deleted

## 2017-04-30 DIAGNOSIS — G8929 Other chronic pain: Secondary | ICD-10-CM

## 2017-04-30 DIAGNOSIS — R1013 Epigastric pain: Principal | ICD-10-CM

## 2017-05-12 ENCOUNTER — Ambulatory Visit (HOSPITAL_COMMUNITY)
Admission: RE | Admit: 2017-05-12 | Discharge: 2017-05-12 | Disposition: A | Discharge status: Home / Self Care | Payer: 59 | Source: Ambulatory Visit | Attending: Internal Medicine | Admitting: Internal Medicine

## 2017-05-12 DIAGNOSIS — G8929 Other chronic pain: Secondary | ICD-10-CM | POA: Diagnosis present

## 2017-05-12 DIAGNOSIS — Z9884 Bariatric surgery status: Secondary | ICD-10-CM | POA: Insufficient documentation

## 2017-05-12 DIAGNOSIS — R1013 Epigastric pain: Secondary | ICD-10-CM | POA: Insufficient documentation

## 2017-05-12 LAB — COMPREHENSIVE METABOLIC PANEL
ALBUMIN: 4.1 g/dL (ref 3.6–5.1)
ALT: 20 U/L (ref 6–29)
AST: 19 U/L (ref 10–35)
Alkaline Phosphatase: 65 U/L (ref 33–130)
BUN: 16 mg/dL (ref 7–25)
CHLORIDE: 94 mmol/L — AB (ref 98–110)
CO2: 32 mmol/L (ref 20–32)
CREATININE: 0.86 mg/dL (ref 0.50–1.05)
Calcium: 9.2 mg/dL (ref 8.6–10.4)
GLUCOSE: 85 mg/dL (ref 65–99)
Potassium: 3.2 mmol/L — ABNORMAL LOW (ref 3.5–5.3)
SODIUM: 136 mmol/L (ref 135–146)
Total Bilirubin: 0.5 mg/dL (ref 0.2–1.2)
Total Protein: 6.7 g/dL (ref 6.1–8.1)

## 2017-05-12 MED ORDER — IOPAMIDOL (ISOVUE-300) INJECTION 61%
100.0000 mL | Freq: Once | INTRAVENOUS | Status: AC | PRN
  Administered 2017-05-12: 100 mL via INTRAVENOUS

## 2017-05-12 MED ORDER — IOPAMIDOL (ISOVUE-300) INJECTION 61%
INTRAVENOUS | Status: AC
  Filled 2017-05-12: qty 30

## 2017-05-13 LAB — SEDIMENTATION RATE: Sed Rate: 28 mm/hr (ref 0–30)

## 2017-05-26 NOTE — Progress Notes (Signed)
A staff message has been sent to [facility] to open an appointment for this patient with Dr. Laural Golden.

## 2017-06-09 ENCOUNTER — Encounter (INDEPENDENT_AMBULATORY_CARE_PROVIDER_SITE_OTHER): Payer: Self-pay | Admitting: Internal Medicine

## 2017-08-24 ENCOUNTER — Ambulatory Visit (INDEPENDENT_AMBULATORY_CARE_PROVIDER_SITE_OTHER): Payer: 59 | Admitting: Internal Medicine

## 2017-10-04 ENCOUNTER — Ambulatory Visit (INDEPENDENT_AMBULATORY_CARE_PROVIDER_SITE_OTHER): Payer: 59 | Admitting: Internal Medicine

## 2017-10-04 ENCOUNTER — Encounter (INDEPENDENT_AMBULATORY_CARE_PROVIDER_SITE_OTHER): Payer: Self-pay | Admitting: Internal Medicine

## 2017-10-04 VITALS — BP 120/90 | HR 62 | Temp 98.3°F | Resp 18 | Ht 66.75 in | Wt 215.8 lb

## 2017-10-04 DIAGNOSIS — R200 Anesthesia of skin: Secondary | ICD-10-CM | POA: Diagnosis not present

## 2017-10-04 DIAGNOSIS — K219 Gastro-esophageal reflux disease without esophagitis: Secondary | ICD-10-CM | POA: Diagnosis not present

## 2017-10-04 DIAGNOSIS — E876 Hypokalemia: Secondary | ICD-10-CM | POA: Diagnosis not present

## 2017-10-04 DIAGNOSIS — K3184 Gastroparesis: Secondary | ICD-10-CM

## 2017-10-04 DIAGNOSIS — R252 Cramp and spasm: Secondary | ICD-10-CM

## 2017-10-04 MED ORDER — DEXLANSOPRAZOLE 30 MG PO CPDR: 30.0000 mg | DELAYED_RELEASE_CAPSULE | Freq: Every day | ORAL | 5 refills | Status: DC

## 2017-10-04 NOTE — Patient Instructions (Signed)
Physician will call with results of blood test. Notify if low dose of Dexilant does not work.

## 2017-10-04 NOTE — Progress Notes (Signed)
Presenting complaint;  Follow-up for GERD gastroparesis and abdominal pain. Patient has multiple other complaints.  Database and subjective:  Patient is a 52 year old Caucasian female who was seen in office in June 2018 for epigastric pain and persistent symptoms of GERD.  Past history significant for placement of lap band laparoscopically in October 2012.  Patient states that never helped even though it was adjustable.  Lap band was removed because it eroded into her stomach in March 2014. She underwent EGD on 04/09/2017 revealing irregular GE junction but biopsy was negative for Barrett's.  She had nonerosive gastritis and gastric biopsy was negative for H. pylori.  She had large amount of food debris in the stomach.  She reported no improvement with short course of metoclopramide. She therefore underwent solid-phase gastric emptying study on 04/22/2017 revealing mild gastroparesis.  Emptying at 4 hours of 80.2% indicative of mild gastroparesis.  Dietary measures were recommended. She underwent abdominopelvic CT with contrast to further evaluate her postprandial epigastric pain.  She had postoperative changes in the proximal stomach second to previous surgery but there was no evidence of biliary dilation or pancreatic abnormalities.   Patient has been watching her diet.  She states she is eating small portions and she is on low-carb diet [facility]  Since her last visit she has lost 17 pounds.  However she states she has lost 35 pounds in the last 4 months because she weighed 250 pounds then.  She has occasional heartburn.  She is still having upper abdominal pain but is not daily and not as pronounced as before.  She says she is not having fluid retention in her lower extremities.  She complains of cold flashes she also complains of numbness in her feet after exercise.  She also complains of cramping primarily in the left flank.  She has history of hypokalemia as well as elevated  sed rate and wants follow-up testing.  She is also concerned about diabetes. She has brought along to OTC medication that she wants to try to expedite weight loss.  1 of them is collagen preparation and another one has multiple ingredients including milk thistle.  I told her she can try these medications at the recommended dose.  If he if she has side effects she can stop these preparations.   Current Medications: Outpatient Encounter Medications as of 10/04/2017  Medication Sig  . ALPRAZolam (XANAX) 1 MG tablet Take 1 mg by mouth 3 (three) times daily as needed for anxiety.   . Biotin w/ Vitamins C & E (HAIR/SKIN/NAILS PO) Take 3 each by mouth daily. Chewable  . Calcium Carbonate-Vitamin D (CALCIUM PLUS VITAMIN D PO) Take 2 each by mouth daily. Chewables  . dexlansoprazole (DEXILANT) 60 MG capsule Take 60 mg by mouth daily.  Marland Kitchen loratadine (CLARITIN) 10 MG tablet Take 10 mg by mouth daily as needed for allergies.  Marland Kitchen MONUROL 3 g PACK Take 3 g by mouth daily as needed. Urinary tract infection  . Multiple Vitamins-Minerals (MULTIVITAMIN GUMMIES ADULT PO) Take 2 each by mouth daily.  . polyethylene glycol powder (GLYCOLAX/MIRALAX) powder Take 17 g by mouth daily as needed for constipation.  . Probiotic Product (PROBIOTIC PO) Take 3 each by mouth daily.  Marland Kitchen OVER THE COUNTER MEDICATION Take 2 capsules by mouth 2 (two) times daily before a meal. Slim vance  . OVER THE COUNTER MEDICATION Take 2 capsules by mouth 2 (two) times daily before a meal. CLO for belly fat  . [  DISCONTINUED] clindamycin-benzoyl peroxide (BENZACLIN) gel Apply 1 application topically at bedtime as needed. Acne  . [DISCONTINUED] hydrochlorothiazide (MICROZIDE) 12.5 MG capsule Take 12.5 mg by mouth daily.  . [DISCONTINUED] tobramycin (TOBREX) 0.3 % ophthalmic solution Place 1 drop into both eyes 2 (two) times daily as needed. Eye irritation  . [DISCONTINUED] UNABLE TO FIND Take 2 tablets by mouth 2 (two) times daily. Slimvance   No  facility-administered encounter medications on file as of 10/04/2017.      Objective: Blood pressure 120/90, pulse 62, temperature 98.3 F (36.8 C), temperature source Oral, resp. rate 18, height 5' 6.75" (1.695 m), weight 215 lb 12.8 oz (97.9 kg). Patient is alert and in no acute distress. Conjunctiva is pink. Sclera is nonicteric Oropharyngeal mucosa is normal. No neck masses or thyromegaly noted. Cardiac exam with regular rhythm normal S1 and S2. No murmur or gallop noted. Lungs are clear to auscultation. Abdomen is full but soft and nontender with organomegaly or masses. No LE edema or clubbing noted.  Labs/studies Results:  She had abdominopelvic CT on 05/12/2017 revealing postop changes in proximal stomach but no other abnormalities to account for her abdominal pain.  Assessment:  #1.  GERD and gastroparesis.  Gastroparesis may be related to vagal injury secondary to prior gastric surgery.  She is doing well with PPI and dietary measures.  She may do well with lower dose of PPI.  #2.  Abdominal pain.  Some of her pain may be due to wall cramping and she may have dyspepsia.  EGD was negative for peptic ulcer disease and no abnormality noted on CT to account for pain.  Overall she is doing better.  Will monitor.  #3.  Post exertional numbness in both feet.  Symptoms are not suggestive of peripheral vascular disease or intermittent claudication.  She has a history of mildly elevated sed rate but she has not been diagnosed with inflammatory process.  #4.  History of hypokalemia.   Plan:  Decrease Dexilant to 30 mg p.o. every morning. Patient will go to the lab for TSH sed rate metabolic 7 hemoglobin Z6X and serum magnesium level. Office visit in 6 months.

## 2017-10-08 LAB — HEMOGLOBIN A1C
EAG (MMOL/L): 5.4 (calc)
Hgb A1c MFr Bld: 5 % of total Hgb (ref <5.7)
MEAN PLASMA GLUCOSE: 97 (calc)

## 2017-10-08 LAB — ADD ON BMP
BUN: 15 mg/dL (ref 7–25)
CO2: 22 mmol/L (ref 20–32)
Calcium: 9.5 mg/dL (ref 8.6–10.4)
Chloride: 104 mmol/L (ref 98–110)
Creat: 0.94 mg/dL (ref 0.50–1.05)
GFR, Est African American: 81 mL/min/{1.73_m2} (ref >=60)
GFR, Est Non African American: 70 mL/min/{1.73_m2} (ref >=60)
GLUCOSE: 90 mg/dL (ref 65–139)
POTASSIUM: 4.3 mmol/L (ref 3.5–5.3)
SODIUM: 140 mmol/L (ref 135–146)

## 2017-10-08 LAB — TSH: TSH: 2.52 mIU/L

## 2017-10-08 LAB — SEDIMENTATION RATE: Sed Rate: 19 mm/h (ref 0–30)

## 2017-10-08 LAB — TEST AUTHORIZATION

## 2017-10-08 LAB — MAGNESIUM: MAGNESIUM: 1.9 mg/dL (ref 1.5–2.5)

## 2018-02-25 ENCOUNTER — Telehealth (INDEPENDENT_AMBULATORY_CARE_PROVIDER_SITE_OTHER): Payer: Self-pay | Admitting: *Deleted

## 2018-02-25 NOTE — Telephone Encounter (Signed)
Patient called the office and states that she tried to take 1/2 of the Dexilant (30mg )  but she experienced breakthrough systems.  She is requesting that we call another Rx foe the Dexilant 60 mg -   A new Rx for Dexilant 60 mg - take 1 by mouth daily ,#90 with 3 refills was called to [facility].

## 2018-08-23 ENCOUNTER — Ambulatory Visit (INDEPENDENT_AMBULATORY_CARE_PROVIDER_SITE_OTHER): Payer: 59 | Admitting: Internal Medicine

## 2018-08-23 ENCOUNTER — Encounter (INDEPENDENT_AMBULATORY_CARE_PROVIDER_SITE_OTHER): Payer: Self-pay | Admitting: *Deleted

## 2018-08-23 ENCOUNTER — Encounter (INDEPENDENT_AMBULATORY_CARE_PROVIDER_SITE_OTHER): Payer: Self-pay | Admitting: Internal Medicine

## 2018-08-23 VITALS — BP 110/70 | HR 64 | Temp 98.2°F | Resp 18 | Ht 67.0 in | Wt 181.0 lb

## 2018-08-23 DIAGNOSIS — K219 Gastro-esophageal reflux disease without esophagitis: Secondary | ICD-10-CM | POA: Diagnosis not present

## 2018-08-23 DIAGNOSIS — K5900 Constipation, unspecified: Secondary | ICD-10-CM

## 2018-08-23 DIAGNOSIS — Z87898 Personal history of other specified conditions: Secondary | ICD-10-CM | POA: Diagnosis not present

## 2018-08-23 DIAGNOSIS — K3184 Gastroparesis: Secondary | ICD-10-CM | POA: Diagnosis not present

## 2018-08-23 DIAGNOSIS — R1310 Dysphagia, unspecified: Secondary | ICD-10-CM

## 2018-08-23 DIAGNOSIS — R1319 Other dysphagia: Secondary | ICD-10-CM

## 2018-08-23 MED ORDER — POLYETHYLENE GLYCOL 3350 17 GM/SCOOP PO POWD: 17.0000 g | Freq: Every day | ORAL | 5 refills | Status: DC | PRN

## 2018-08-23 MED ORDER — DEXLANSOPRAZOLE 60 MG PO CPDR: 60.0000 mg | DELAYED_RELEASE_CAPSULE | Freq: Every day | ORAL | 5 refills | Status: DC

## 2018-08-23 MED ORDER — SCOPOLAMINE 1 MG/3DAYS TD PT72: 1.0000 | MEDICATED_PATCH | TRANSDERMAL | 0 refills | Status: DC

## 2018-08-23 NOTE — Progress Notes (Signed)
Presenting complaint;  Follow-up for GERD gastroparesis and constipation. Patient complains of dysphagia.  Database and subjective:  Patient is 52 year old Caucasian female who is here for scheduled visit.  She was last seen in January this year.  She has chronic GERD mild gastroparesis as well as chronic constipation.  On her last visit dexlansoprazole dose was reduced to 30 mg daily but she called back stating that she was having frequent heartburn and therefore dose was changed back to 60 mg daily. She states she is doing well as far as heartburn is concerned.  She may have an episode here and there.  She rarely has regurgitation.  She denies nausea or vomiting.  She complains of dysphagia to liquids and solids.  She says this started 3 months ago.  At times she has difficulty swallowing saliva.  Other times she feels she has not in her throat.  She has difficulty both with liquids and solids.  She also complains of dizzy spells she has had 1 or 2 episodes per week.  She remains on limited diet.  She is on protein shakes.  She has lost 34 pounds in the last 11 months.  She states her goal is to get down to around 150 pounds.  She also goes to the gym 7 days a week.  She says MiraLAX works very well and she needs a new prescription.  If she does not take MiraLAX she can go 2 or 3 days without a bowel movement.  She denies melena or rectal bleeding. She also complains of daily nightmares that she has had every night for the last 4 years. She is planning to go on a cruise in the near future and requests scopolamine prescription. She does not have appointment with her PCP in near future.   Current Medications: Outpatient Encounter Medications as of 08/23/2018  Medication Sig  . ALPRAZolam (XANAX) 1 MG tablet Take 1 mg by mouth 3 (three) times daily as needed for anxiety.   . Ascorbic Acid (VITAMIN C ADULT GUMMIES PO) Take by mouth 2 (two) times daily.  . Biotin w/ Vitamins C & E (HAIR/SKIN/NAILS PO)  Take 3 each by mouth daily. Chewable  . Calcium Carbonate-Vitamin D (CALCIUM PLUS VITAMIN D PO) Take 2 each by mouth daily. Chewables  . dexlansoprazole (DEXILANT) 60 MG capsule Take 60 mg by mouth daily.  Marland Kitchen loratadine (CLARITIN) 10 MG tablet Take 10 mg by mouth daily as needed for allergies.  . metFORMIN (GLUCOPHAGE) 500 MG tablet Take 500 mg by mouth 2 (two) times daily.  Marland Kitchen MONUROL 3 g PACK Take 3 g by mouth daily as needed. Urinary tract infection  . Multiple Vitamins-Minerals (MULTIVITAMIN GUMMIES ADULT PO) Take 2 each by mouth daily.  . polyethylene glycol powder (GLYCOLAX/MIRALAX) powder Take 17 g by mouth daily as needed for constipation.  . Probiotic Product (ALIGN) CHEW Chew by mouth daily. Patient chews two daily.  . [DISCONTINUED] Dexlansoprazole 30 MG capsule Take 1 capsule (30 mg total) by mouth daily before breakfast. (Patient not taking: Reported on 08/23/2018)  . [DISCONTINUED] OVER THE COUNTER MEDICATION Take 2 capsules by mouth 2 (two) times daily before a meal. Slim vance  . [DISCONTINUED] OVER THE COUNTER MEDICATION Take 2 capsules by mouth 2 (two) times daily before a meal. CLO for belly fat  . [DISCONTINUED] Probiotic Product (PROBIOTIC PO) Take 3 each by mouth daily.   No facility-administered encounter medications on file as of 08/23/2018.      Objective: Blood pressure 110/70,  pulse 64, temperature 98.2 F (36.8 C), temperature source Oral, resp. rate 18, height 5\' 7"  (1.702 m), weight 181 lb (82.1 kg). Patient is alert and in no acute distress. Conjunctiva is pink. Sclera is nonicteric Oropharyngeal mucosa is normal. No neck masses or thyromegaly noted. Cardiac exam with regular rhythm normal S1 and S2. No murmur or gallop noted. Lungs are clear to auscultation. Abdomen is symmetrical.  She has multiple laparoscopy scars across upper abdomen.  On palpation abdomen is soft and nontender.  She does not have succussion splash in epigastric region.  No organomegaly or  masses. No LE edema or clubbing noted.  Labs/studies Results:   EGD on 04/09/2017 revealed irregular GE junction large amount of food debris in the stomach and gastritis.  Biopsy from GE junction negative for Barrett's and biopsy from antrum negative for H. pylori.  Gastric emptying study on 04/22/2017. 80.2% emptying at 4 hours.  Normal greater than 90%   Assessment:  #1.  GERD/gastroparesis.  She is presently on protein shakes and low fiber diet.  She appears to be doing well with current diet and PPI.  She was tried on 30 mg of dexlansoprazole but she did not do well.  Therefore she is back on 60 mg daily.  #2.  Chronic constipation.  Once again she is doing well with polyethylene glycol.  She should try increasing dietary fiber if possible.  #3.  Esophageal dysphagia.  EGD in July last year did not reveal ring stricture.  I wonder if she has esophageal motility disorder.  Doubt that she has developed esophageal stricture since her last EGD.  #4.  History of motion sickness.  Patient is planning to go on a cruise.  She has used scopolamine patch in the past and it has helped.  #5.  Voluntary weight loss.   Plan:  Barium pill esophagogram. New prescription given for dexlansoprazole 60 mg p.o. every morning. New prescription also given for polyethylene glycol. Transdermal scopolamine 1.5 mg.  Patch to be applied to skin every 3 days.  10 doses given. Patient advised to make an appointment to see Dr. Domingo Dimes regarding her dizzy spells and daily nightmares. Office visit in 6 months.

## 2018-08-23 NOTE — Patient Instructions (Signed)
Barium pill esophagogram to be scheduled. Follow-up with primary care physician regarding non-GI issues as discussed.

## 2018-08-26 ENCOUNTER — Other Ambulatory Visit (INDEPENDENT_AMBULATORY_CARE_PROVIDER_SITE_OTHER): Payer: Self-pay | Admitting: *Deleted

## 2018-08-26 ENCOUNTER — Ambulatory Visit (HOSPITAL_COMMUNITY)
Admission: RE | Admit: 2018-08-26 | Discharge: 2018-08-26 | Disposition: A | Discharge status: Home / Self Care | Payer: 59 | Source: Ambulatory Visit | Attending: Internal Medicine | Admitting: Internal Medicine

## 2018-08-26 DIAGNOSIS — D649 Anemia, unspecified: Secondary | ICD-10-CM

## 2018-08-26 DIAGNOSIS — R1310 Dysphagia, unspecified: Secondary | ICD-10-CM | POA: Diagnosis present

## 2018-08-26 DIAGNOSIS — R1319 Other dysphagia: Secondary | ICD-10-CM

## 2018-10-31 ENCOUNTER — Telehealth (INDEPENDENT_AMBULATORY_CARE_PROVIDER_SITE_OTHER): Payer: Self-pay | Admitting: Internal Medicine

## 2018-10-31 NOTE — Telephone Encounter (Signed)
See previous note - please call patient at [phone]

## 2018-10-31 NOTE — Telephone Encounter (Signed)
I have returned the patient's call , her voice mail is full and no messages can be left. Per the patient's symptoms she should go to the ED or Urgent Care for further evaluation.

## 2018-10-31 NOTE — Telephone Encounter (Signed)
Patient called stated she has been feeling weak all weekend and she has taken her blood pressure and it seems to be very low - she stated she didn't want to go to her PCP because he seems to never do anything and also stated she doesn't want to go to the ED

## 2018-11-24 IMAGING — US US ABDOMEN COMPLETE
1 series · 14 of 25 positions shown · non-contrast
Comparison: None.

CLINICAL DATA: Abdominal pain.

EXAM:
ABDOMEN ULTRASOUND COMPLETE

[Series 1: us abdomen complete · 0.28mm/px · 14 of 90 slices shown]
[im 1/90]
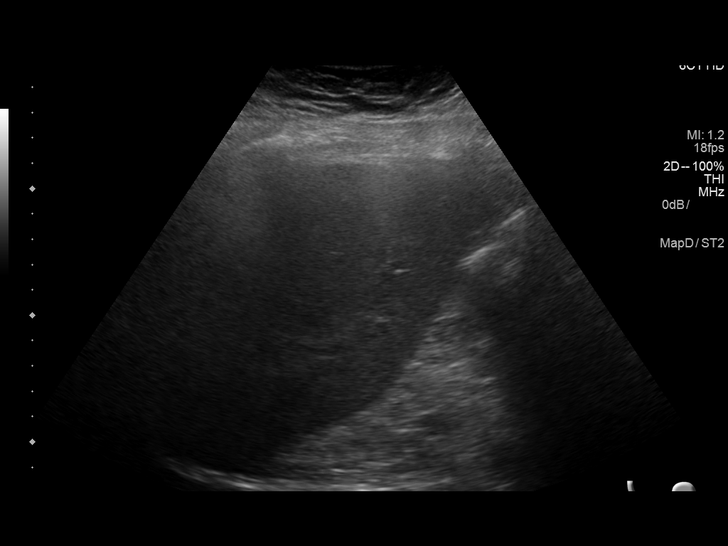
[im 8/90]
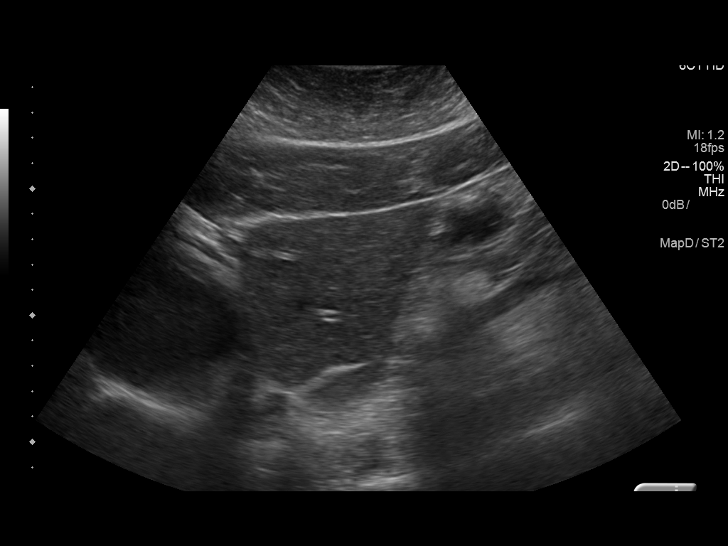
[im 15/90]
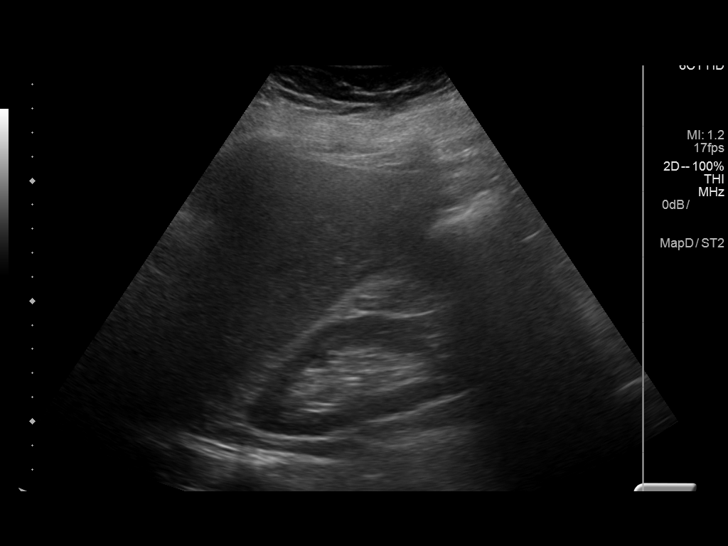
[im 23/90]
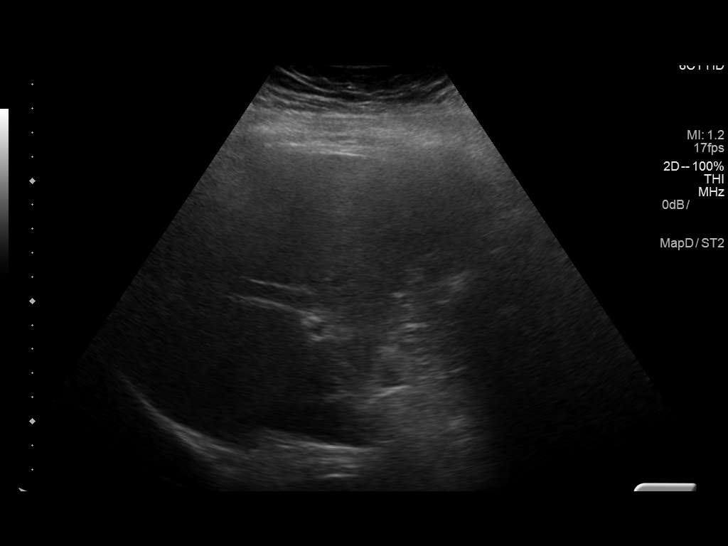
[im 30/90]
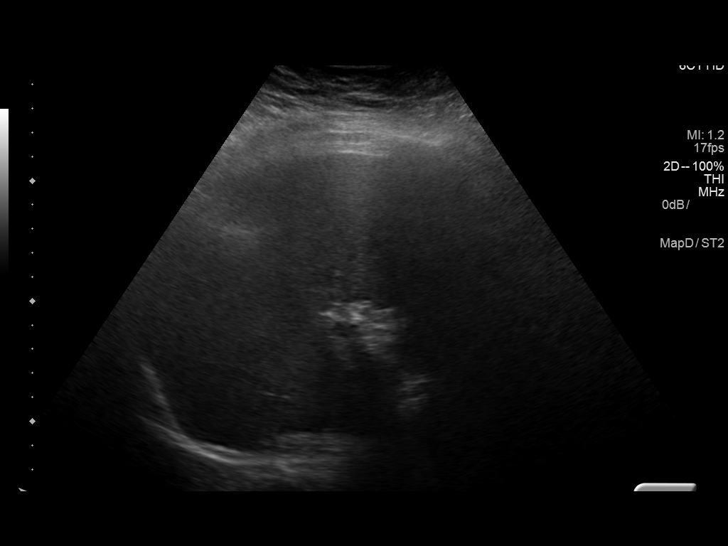
[im 34/90]
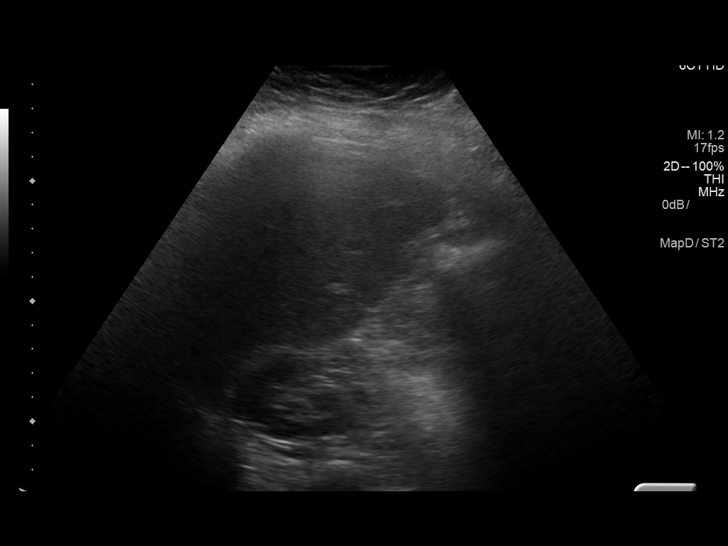
[im 41/90]
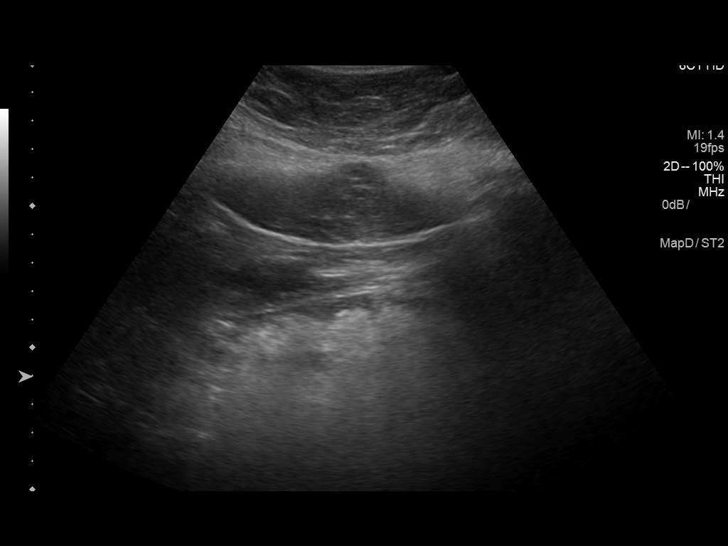
[im 49/90]
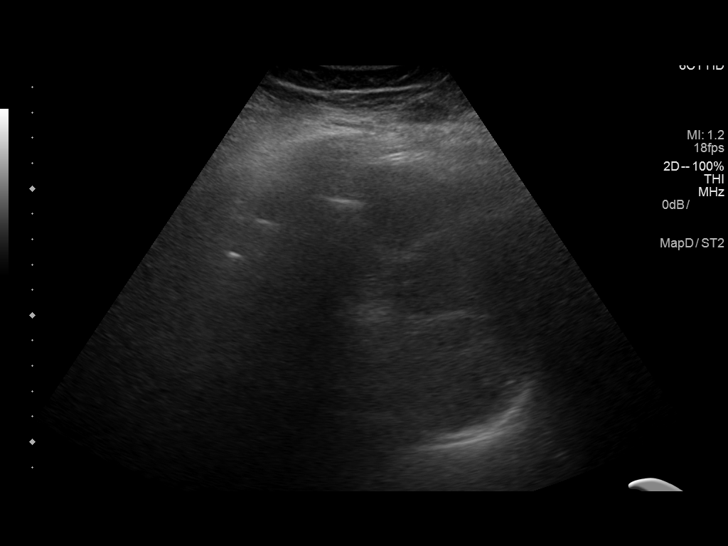
[im 56/90]
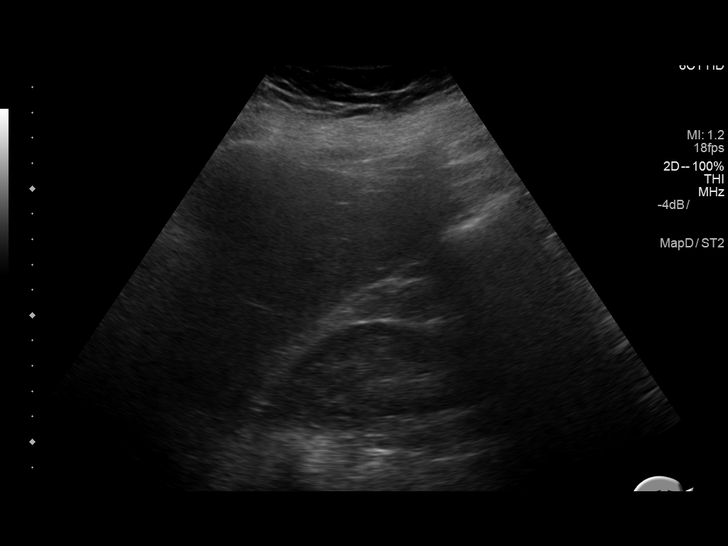
[im 60/90]
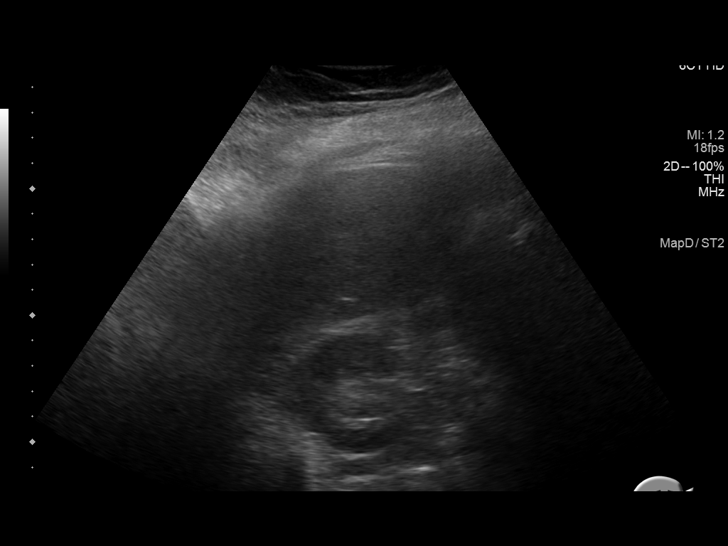
[im 67/90]
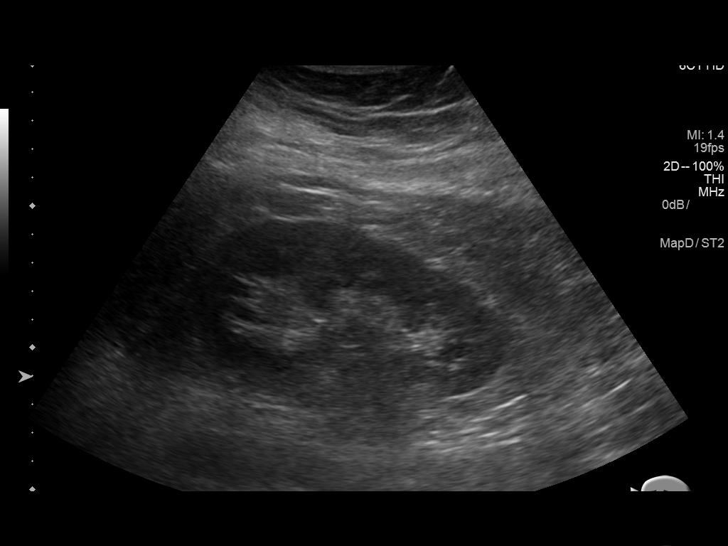
[im 75/90]
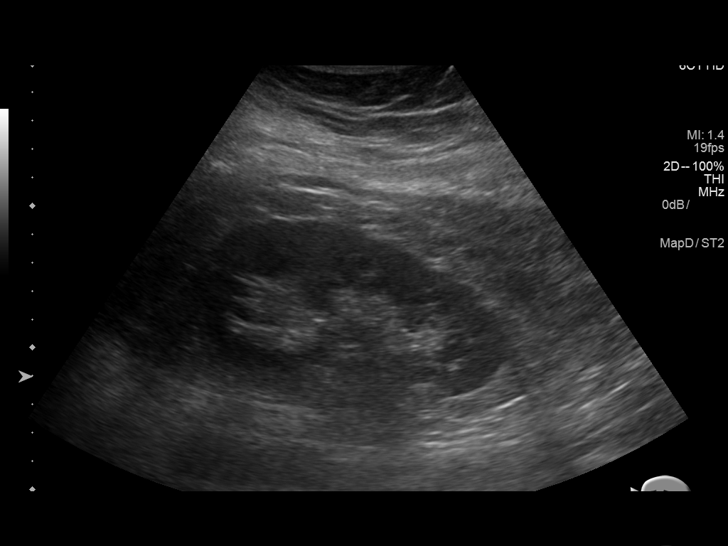
[im 82/90]
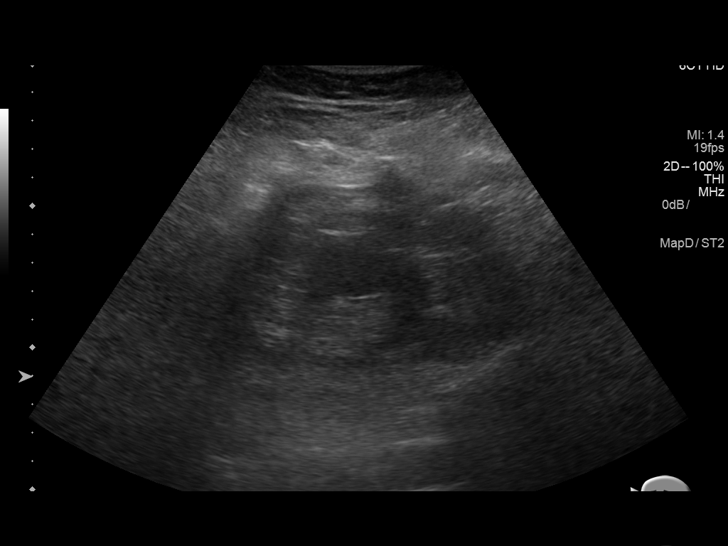
[im 90/90]
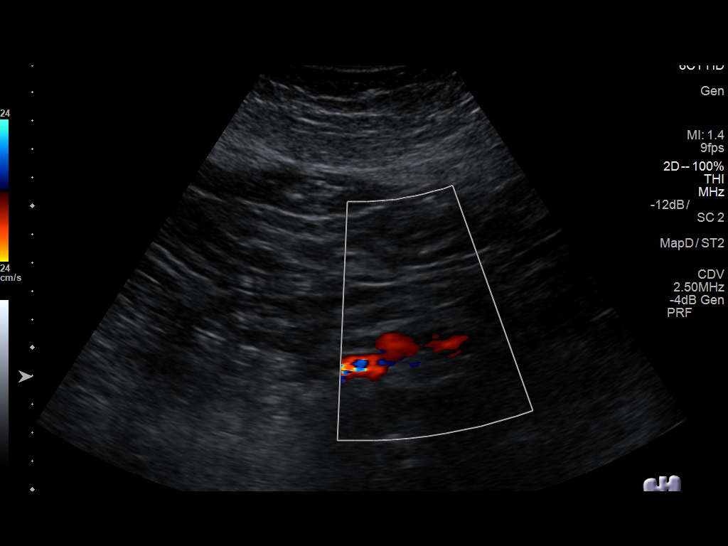

[14 of 25 positions shown; findings below may reference images not displayed]

FINDINGS: Gallbladder: Prior cholecystectomy.

Common bile duct: Diameter: 4.3 mm

Liver: No focal lesion identified. Increased hepatic parenchymal
echogenicity.

IVC: No abnormality visualized.

Pancreas: Limited visualization secondary to body habitus.

Spleen: Size and appearance within normal limits.

Right Kidney: Length: 11 cm. Echogenicity within normal limits. No
mass or hydronephrosis visualized.

Left Kidney: Length: 11.5 cm. Echogenicity within normal limits. No
mass or hydronephrosis visualized.

Abdominal aorta: No aneurysm visualized.

Other findings: None.
IMPRESSION: 1. Prior cholecystectomy.
2. Increased hepatic echogenicity as can be seen with hepatic
steatosis.
3. Pancreas is suboptimally visualized.

## 2019-02-21 ENCOUNTER — Ambulatory Visit (INDEPENDENT_AMBULATORY_CARE_PROVIDER_SITE_OTHER): Payer: 59 | Admitting: Internal Medicine

## 2019-02-22 ENCOUNTER — Encounter (INDEPENDENT_AMBULATORY_CARE_PROVIDER_SITE_OTHER): Payer: Self-pay | Admitting: Internal Medicine

## 2019-02-26 ENCOUNTER — Other Ambulatory Visit (INDEPENDENT_AMBULATORY_CARE_PROVIDER_SITE_OTHER): Payer: Self-pay | Admitting: Internal Medicine

## 2019-05-22 ENCOUNTER — Other Ambulatory Visit (INDEPENDENT_AMBULATORY_CARE_PROVIDER_SITE_OTHER): Payer: Self-pay | Admitting: Internal Medicine

## 2019-05-23 NOTE — Telephone Encounter (Signed)
Patient will need to have a OV prior to further refills. She may see Colleen.

## 2019-07-25 ENCOUNTER — Ambulatory Visit (INDEPENDENT_AMBULATORY_CARE_PROVIDER_SITE_OTHER): Payer: 59 | Admitting: Nurse Practitioner

## 2019-08-03 ENCOUNTER — Other Ambulatory Visit (INDEPENDENT_AMBULATORY_CARE_PROVIDER_SITE_OTHER): Payer: Self-pay | Admitting: Internal Medicine

## 2019-09-12 ENCOUNTER — Encounter (INDEPENDENT_AMBULATORY_CARE_PROVIDER_SITE_OTHER): Payer: Self-pay | Admitting: Internal Medicine

## 2019-09-12 ENCOUNTER — Ambulatory Visit (INDEPENDENT_AMBULATORY_CARE_PROVIDER_SITE_OTHER): Payer: 59 | Admitting: Internal Medicine

## 2019-09-12 ENCOUNTER — Other Ambulatory Visit: Payer: Self-pay

## 2019-09-12 DIAGNOSIS — K3184 Gastroparesis: Secondary | ICD-10-CM | POA: Diagnosis not present

## 2019-09-12 DIAGNOSIS — K219 Gastro-esophageal reflux disease without esophagitis: Secondary | ICD-10-CM | POA: Diagnosis not present

## 2019-09-12 DIAGNOSIS — R1012 Left upper quadrant pain: Secondary | ICD-10-CM | POA: Diagnosis not present

## 2019-09-12 DIAGNOSIS — U071 COVID-19: Secondary | ICD-10-CM | POA: Diagnosis not present

## 2019-09-12 MED ORDER — DEXILANT 60 MG PO CPDR: 60.0000 mg | DELAYED_RELEASE_CAPSULE | Freq: Every day | ORAL | 3 refills | Status: DC

## 2019-09-12 NOTE — Patient Instructions (Signed)
Patient will need face-to-face visit so that she can be examined before further studies recommended for abdominal pain.

## 2019-09-12 NOTE — Progress Notes (Signed)
Virtual Visit via Telephone Note  Patient had scheduled face-to-face visit today.  Because patient recently tested positive for Covid it was decided to change visit to virtual/telephone visit and we both agreed.  I decided to come call about the no problem no problem I connected with Kimberly Ortega on 09/12/19 at  2:20 PM EST by telephone and verified that I am speaking with the correct person using two identifiers.  Location: Patient: home Provider: office   I discussed the limitations, risks, security and privacy concerns of performing an evaluation and management service by telephone and the availability of in person appointments. I also discussed with the patient that there may be a patient responsible charge related to this service. The patient expressed understanding and agreed to proceed.   History of Present Illness:  Patient is 53 year old Caucasian female who has chronic GERD and gastroparesis who was last seen in December 2019 and was complaining of dysphagia.  She had had prior EGD in July 2018 which revealed unremarkable esophagus and food debris She also had gastritis.  Biopsy from GE junction was negative for Barrett's and biopsy from gastric antrum was negative for H. Pylori.  She returned for gastric emptying study in August 2018 and at 4 hours only 80.2% had emptied consistent with mild gastroparesis.  Since no esophageal abnormality had been found I felt that she may esophageal motility disorder. She had barium swallow in December 2019 which suggested mild esophageal dysmotility with mild esophageal dilation but no ring stricture valve was noted.  It was decided to monitor her symptoms and consider manometry if there is progression.  Today her complaint is 1 of left upper quadrant abdominal pain this pain started few weeks ago.  There is no change with meals.  She has intermittent nausea but no vomiting.  She describes this pain to be sharp pain and not cramping.  She feels the  pain is quite intense and she feels she needs to be evaluated as soon as possible.  She says she has been unlimited food.  She has been drinking shakes.  She is not sure if she has lost any weight.  She is having heartburn no more than twice a week for which she uses chewable antacid.  She is not sure about whether it is calcium based her magnesium base.  She denies melena or rectal bleeding.   Observations/Objective:  Patient weighed 181 pounds on August 23, 2018. No recent weight in chart.  Assessment and Plan:  #1.  GERD gastroparesis.  She is doing well with dietary measures and PPI.  #2 left upper quadrant abdominal pain.  She has nausea which is possibly due to gastroparesis.  This pain is not made worse with meals.  It is very difficult to determine as to the cause of this pain without benefit of abdominal exam. We will try to see patient within the next 2 weeks.  However if pain becomes more intense or unbearable or she has vomiting or fever she will need to report to emergency room.  Follow Up Instructions:   Office visit within the next 2 weeks. Patient advised to go to emergency room if pain more intense or she has vomiting or fever. I discussed the assessment and treatment plan with the patient. The patient was provided an opportunity to ask questions and all were answered. The patient agreed with the plan and demonstrated an understanding of the instructions.   The patient was advised to call back or seek an in-person evaluation  if the symptoms worsen or if the condition fails to improve as anticipated.  I provided 12 minutes of non-face-to-face time during this encounter.   Hildred Laser, MD

## 2019-09-18 ENCOUNTER — Ambulatory Visit (INDEPENDENT_AMBULATORY_CARE_PROVIDER_SITE_OTHER): Payer: 59 | Admitting: Nurse Practitioner

## 2019-09-28 ENCOUNTER — Encounter (INDEPENDENT_AMBULATORY_CARE_PROVIDER_SITE_OTHER): Payer: Self-pay

## 2019-10-04 ENCOUNTER — Ambulatory Visit (INDEPENDENT_AMBULATORY_CARE_PROVIDER_SITE_OTHER): Payer: 59 | Admitting: Internal Medicine

## 2019-10-04 ENCOUNTER — Encounter (INDEPENDENT_AMBULATORY_CARE_PROVIDER_SITE_OTHER): Payer: Self-pay | Admitting: Internal Medicine

## 2019-10-04 ENCOUNTER — Other Ambulatory Visit: Payer: Self-pay

## 2019-10-04 DIAGNOSIS — R635 Abnormal weight gain: Secondary | ICD-10-CM

## 2019-10-04 DIAGNOSIS — R1012 Left upper quadrant pain: Secondary | ICD-10-CM

## 2019-10-04 MED ORDER — HYOSCYAMINE SULFATE 0.125 MG SL SUBL: 0.1250 mg | SUBLINGUAL_TABLET | Freq: Four times a day (QID) | SUBLINGUAL | 2 refills | Status: DC | PRN

## 2019-10-04 MED ORDER — FAMOTIDINE 20 MG PO TABS: 20.0000 mg | ORAL_TABLET | Freq: Every evening | ORAL | Status: AC | PRN

## 2019-10-04 NOTE — Progress Notes (Signed)
Presenting complaint;  Left upper quadrant abdominal pain.  Database and subjective:  Patient is 54 year old Caucasian female with chronic GERD gastroparesis and history of upper abdominal pain presents for reevaluation of left upper quadrant pain.  She was last seen via virtual visit on 09/12/2019 following an episode of severe pain which made her think about going to emergency room but pain got better and she did not. She states she had another episode around 2 AM today.  She says this occurred while she was in bed awake.  She has insomnia.  She states pain was sharp fairly localized below the left costal margin and left mid abdomen.  Pain only lasted for 3 minutes.  This pain was not associated with nausea vomiting dysuria hematuria or urgency to have a bowel movement.  She did experience localized pain just above into the left umbilicus as she was leaving.  Examination of this area and standing position did not reveal any hernia. She has very good appetite.  She has gained 40 pounds since December 2019 when she weighed 181 pounds.  She says with Covid-19 pandemic she has not been exercising.  She says heartburn is well controlled with dexlansoprazole.  She occasionally takes Pepcid at bedtime.  She is using polyethylene glycol no more than 3-4 times in a month.  Her last colonoscopy was in August 2016. Patient reports her paternal grandfather had colon carcinoma.  Her paternal uncle had unknown carcinoma. She had lap band in October 2012.  This band was removed because eroded back in April 2014.  Current Medications: Outpatient Encounter Medications as of 10/04/2019  Medication Sig  . acetaminophen (TYLENOL) 500 MG tablet Take 1,000 mg by mouth every 6 (six) hours as needed.  . ALPRAZolam (XANAX) 1 MG tablet Take 1 mg by mouth 3 (three) times daily as needed for anxiety.   . Biotin w/ Vitamins C & E (HAIR/SKIN/NAILS PO) Take 3 each by mouth daily. Chewable  . Calcium Carbonate-Vitamin D (CALCIUM  PLUS VITAMIN D PO) Take 2 each by mouth daily. Chewables  . dexlansoprazole (DEXILANT) 60 MG capsule Take 1 capsule (60 mg total) by mouth daily.  Marland Kitchen lamoTRIgine (LAMICTAL) 100 MG tablet Take 100 mg by mouth 2 (two) times daily.   Marland Kitchen loratadine (CLARITIN) 10 MG tablet Take 10 mg by mouth daily as needed for allergies.  . metFORMIN (GLUCOPHAGE) 1000 MG tablet Take 1,000 mg by mouth daily.   Marland Kitchen MONUROL 3 g PACK Take 3 g by mouth daily as needed. Urinary tract infection  . Multiple Vitamins-Minerals (MULTIVITAMIN GUMMIES ADULT PO) Take 2 each by mouth daily.  . polyethylene glycol powder (GLYCOLAX/MIRALAX) powder Take 17 g by mouth daily as needed.  . Probiotic Product (ALIGN) CHEW Chew by mouth daily. Patient chews three daily.  . [DISCONTINUED] Ascorbic Acid (VITAMIN C ADULT GUMMIES PO) Take by mouth daily.   . [DISCONTINUED] VITAMIN D PO Take 2,000 Units by mouth daily.   No facility-administered encounter medications on file as of 10/04/2019.     Objective: Blood pressure 125/87, pulse 80, temperature 98 F (36.7 C), temperature source Temporal, height 5\' 7"  (1.702 m), weight 221 lb 12.8 oz (100.6 kg). Patient is alert and in no acute distress. She is wearing a facial mask. Conjunctiva is pink. Sclera is nonicteric Oropharyngeal mucosa is normal. No neck masses or thyromegaly noted. Cardiac exam with regular rhythm normal S1 and S2. No murmur or gallop noted. Lungs are clear to auscultation. Abdomen is full.  She has multiple  laparoscopy scars.  Bowel sounds are normal.  No bruit noted.  She has mild tenderness over left costal margin.  She also has mild tenderness below the costal margin as well as left mid abdomen.  It does not appear to be superficial.  It is mild without guarding.  No organomegaly or masses. No LE edema or clubbing noted.  Labs/studies Results:  CT images from Jun 06, 2017 reviewed.  No abnormality noted in left upper quadrant.  The study was performed for upper  abdominal pain.  Assessment:  #1.  Left upper quadrant abdominal pain.  She had an episode around 2 AM this morning and she had an episode about 4 weeks ago.  These episodes are not associated with any other symptoms such as nausea vomiting diarrhea melena rectal bleeding or hematuria.  She had CT in August 2018 for possibly different type of pain and no abnormality was noted to account for pain.  Patient's abdominal examination is rather benign.  It is hard to tell if this is musculoskeletal pain pain due to IBS or referred pain.  Will send to monitor the symptom for now.  If she has another episode of severe pain she would be better served by going to the emergency room immediately so that she could be evaluated during an acute episode.  #2.  Weight gain.  She has gained 40 pounds.  Need to rule out thyroid disease  Plan:  CBC with differential. Urinalysis. TSH. Hyoscyamine sublingual 1 tablet 4 times a day as needed. Patient advised to keep symptom diary.  If she has another episode of significant pain would consider abdominal pelvic CT. Follow-up in 3 months.

## 2019-10-04 NOTE — Patient Instructions (Signed)
Physician will call with results of blood test when completed. 

## 2019-11-02 ENCOUNTER — Other Ambulatory Visit (INDEPENDENT_AMBULATORY_CARE_PROVIDER_SITE_OTHER): Payer: Self-pay | Admitting: Internal Medicine

## 2020-01-30 ENCOUNTER — Ambulatory Visit (INDEPENDENT_AMBULATORY_CARE_PROVIDER_SITE_OTHER): Payer: 59 | Admitting: Internal Medicine

## 2020-02-01 ENCOUNTER — Ambulatory Visit (INDEPENDENT_AMBULATORY_CARE_PROVIDER_SITE_OTHER): Payer: 59 | Admitting: Internal Medicine

## 2020-03-13 ENCOUNTER — Encounter: Payer: Self-pay | Admitting: *Deleted

## 2020-03-13 NOTE — Progress Notes (Signed)
Source: referral notes from [facility] for appt with Dr. Krista Blue on Friday 03/15/20.

## 2020-03-15 ENCOUNTER — Ambulatory Visit: Payer: 59 | Admitting: Neurology

## 2020-03-20 ENCOUNTER — Encounter: Payer: Self-pay | Admitting: Diagnostic Neuroimaging

## 2020-03-20 ENCOUNTER — Ambulatory Visit (INDEPENDENT_AMBULATORY_CARE_PROVIDER_SITE_OTHER): Payer: 59 | Admitting: Diagnostic Neuroimaging

## 2020-03-20 VITALS — BP 126/87 | HR 82 | Ht 67.0 in | Wt 224.0 lb

## 2020-03-20 DIAGNOSIS — G43109 Migraine with aura, not intractable, without status migrainosus: Secondary | ICD-10-CM

## 2020-03-20 DIAGNOSIS — R519 Headache, unspecified: Secondary | ICD-10-CM

## 2020-03-20 MED ORDER — TOPIRAMATE 50 MG PO TABS: 50.0000 mg | ORAL_TABLET | Freq: Two times a day (BID) | ORAL | 12 refills | Status: DC

## 2020-03-20 MED ORDER — DIAZEPAM 2 MG PO TABS: 2.0000 mg | ORAL_TABLET | ORAL | 0 refills | Status: DC | PRN

## 2020-03-20 MED ORDER — RIZATRIPTAN BENZOATE 10 MG PO TBDP: 10.0000 mg | ORAL_TABLET | ORAL | 11 refills | Status: DC | PRN

## 2020-03-20 NOTE — Patient Instructions (Signed)
-   new onset headaches; change in type and frequency - check MRI brain   MIGRAINE TREATMENT PLAN:  MIGRAINE PREVENTION  LIFESTYLE CHANGES -Stop or avoid smoking -Decrease or avoid caffeine / alcohol -Eat and sleep on a regular schedule -Exercise several times per week - start topiramate 50mg  at bedtime; after 1-2 weeks increase to 50mg  twice a day; drink plenty of water   MIGRAINE RESCUE  - tylenol as needed  - start rizatriptan (Maxalt) 10mg  as needed for breakthrough headache; may repeat x 1 after 2 hours; max 2 tabs per day or 8 per month

## 2020-03-20 NOTE — Progress Notes (Addendum)
[facility]  PATIENT: Kimberly Ortega DOB: 15-May-1966  REFERRING CLINICIAN: Glenda Chroman, MD HISTORY FROM: patient  REASON FOR VISIT: new consult    HISTORICAL  CHIEF COMPLAINT:  Chief Complaint  Patient presents with  . Migraine    rm 6 New Pt  "migraines off and on, going through menopause; have daily dull headache; Tylenol or Excedrin is all I take; having nightmares/dreams every night"    HISTORY OF PRESENT ILLNESS:   54 year old female here for evaluation of headaches.  History of anxiety and panic attacks.  Patient has history of migraine since age 27 years old with throbbing headaches, seeing squiggly lines, nausea and photophobia.  Patient manages conservatively with over-the-counter medication and rest.  Over past 8 months patient had increasing frequency and severity of headaches now having 2-3 severe headaches per week.  Also with daily dull nagging headaches.  Sometimes has nausea.  Headaches can be right or left-sided, sometimes around her eyes.  Triggers include stress and lack of sleep.  Patient taking Tylenol almost on daily basis.  Sometimes uses ice packs.   REVIEW OF SYSTEMS: Full 14 system review of systems performed and negative with exception of: As per HPI.  ALLERGIES: Allergies  Allergen Reactions  . Ibuprofen Shortness Of Breath and Other (See Comments)    Lung specialist told her not to take   . Macrobid [Nitrofurantoin Macrocrystal] Other (See Comments)    Lung specialist Told it would kill her  SOB  . Morphine And Related Nausea Only    dizziness  . Tramadol     Low blood pressure  . Other Palpitations    PSEUDO COUGH DECONGESTANT    HOME MEDICATIONS: Outpatient Medications Prior to Visit  Medication Sig Dispense Refill  . acetaminophen (TYLENOL) 500 MG tablet Take 1,000 mg by mouth every 6 (six) hours as needed.    . ALPRAZolam (XANAX) 1 MG tablet Take 1 mg by mouth 3 (three) times daily as needed for anxiety.     .  Biotin w/ Vitamins C & E (HAIR/SKIN/NAILS PO) Take 3 each by mouth daily. Chewable    . Calcium Carbonate-Vitamin D (CALCIUM PLUS VITAMIN D PO) Take 2 each by mouth daily. Chewables    . DEXILANT 60 MG capsule TAKE 1 CAPSULE(60 MG) BY MOUTH DAILY BEFORE BREAKFAST 30 capsule 3  . famotidine (PEPCID) 20 MG tablet Take 1 tablet (20 mg total) by mouth at bedtime as needed for heartburn or indigestion.    . hyoscyamine (LEVSIN SL) 0.125 MG SL tablet Place 1 tablet (0.125 mg total) under the tongue every 6 (six) hours as needed. 90 tablet 2  . lamoTRIgine (LAMICTAL) 100 MG tablet Take 100 mg by mouth 2 (two) times daily.     Marland Kitchen loratadine (CLARITIN) 10 MG tablet Take 10 mg by mouth daily as needed for allergies.    . metFORMIN (GLUCOPHAGE) 500 MG tablet Take 500 mg by mouth daily.    Marland Kitchen MONUROL 3 g PACK Take 3 g by mouth daily as needed. Urinary tract infection  0  . MULTIPLE VITAMIN PO Take 1 tablet by mouth daily.    . Multiple Vitamins-Minerals (MULTIVITAMIN GUMMIES ADULT PO) Take 2 each by mouth daily.    . polyethylene glycol powder (GLYCOLAX/MIRALAX) powder Take 17 g by mouth daily as needed. 255 g 5  . Probiotic Product (ALIGN) CHEW Chew by mouth daily. Patient chews three daily.     No facility-administered medications prior to visit.    PAST  MEDICAL HISTORY: Past Medical History:  Diagnosis Date  . Abdominal pain   . Allergies   . Anemia   . Anxiety   . Arthritis   . Complication of anesthesia    dizziness; almost passed out  . Essential hypertension   . Family history of breast cancer    Cousins on both sides of family  . GERD (gastroesophageal reflux disease) 03/01/2017  . History of melanoma   . History of recurrent UTIs   . Migraine   . Multiple nevi   . Obstructive sleep apnea   . Panic disorder 1995  . Polycystic ovarian disease     PAST SURGICAL HISTORY: Past Surgical History:  Procedure Laterality Date  . BIOPSY  04/09/2017   Procedure: BIOPSY;  Surgeon: Rogene Houston, MD;  [facility]: [facility];  Service: Endoscopy;;  gastric esophageal   . CHOLECYSTECTOMY  1991  . COLONOSCOPY  2016  . DILATION AND CURETTAGE OF UTERUS    . ESOPHAGOGASTRODUODENOSCOPY N/A 04/09/2017   Procedure: ESOPHAGOGASTRODUODENOSCOPY (EGD);  Surgeon: Rogene Houston, MD;  [facility]: [facility];  Service: Endoscopy;  Laterality: N/A;  9:25  . LAPAROSCOPIC GASTRIC BANDING  07/07/11  . LAPAROSCOPIC REPAIR AND REMOVAL OF GASTRIC BAND    . UPPER GI ENDOSCOPY N/A 11/21/2012   Procedure: UPPER GI ENDOSCOPY Ileene Hutchinson DIAGNOSTIC LAPAROSCOPY /REMOVAL LAP GASTRIC BAND/INSERTION OF DRAIN;  Surgeon: Pedro Earls, MD;  Location: [facility];  Service: General;  Laterality: N/A;    FAMILY HISTORY: Family History  Problem Relation Age of Onset  . Cervical cancer Mother   . Stroke Mother   . Aneurysm Mother   . Cervical cancer Sister   . Breast cancer Cousin        Both sides of family    SOCIAL HISTORY: Social History   Socioeconomic History  . Marital status: Married    Spouse name: Not on file  . Number of children: 1  . Years of education: Not on file  . Highest education level: Some college, no degree  Occupational History    Comment: na  Tobacco Use  . Smoking status: Never Smoker  . Smokeless tobacco: Never Used  Substance and Sexual Activity  . Alcohol use: Yes    Comment: wine/liquor maybe once a month  . Drug use: No  . Sexual activity: Yes    Birth control/protection: I.U.D.  Other Topics Concern  . Not on file  Social History Narrative   Lives with spouse   Caffeine- none   Social Determinants of Health   Financial Resource Strain:   . Difficulty of Paying Living Expenses:   Food Insecurity:   . Worried About Charity fundraiser in the Last Year:   . Arboriculturist in the Last Year:   Transportation Needs:   . Film/video editor (Medical):   Marland Kitchen Lack of Transportation (Non-Medical):   Physical Activity:   . Days of Exercise per Week:   .  Minutes of Exercise per Session:   Stress:   . Feeling of Stress :   Social Connections:   . Frequency of Communication with Friends and Family:   . Frequency of Social Gatherings with Friends and Family:   . Attends Religious Services:   . Active Member of Clubs or Organizations:   . Attends Archivist Meetings:   Marland Kitchen Marital Status:   Intimate Partner Violence:   . Fear of Current or Ex-Partner:   . Emotionally Abused:   .  Physically Abused:   . Sexually Abused:      PHYSICAL EXAM  GENERAL EXAM/CONSTITUTIONAL: Vitals:  Vitals:   03/20/20 1032  BP: 126/87  Pulse: 82  Weight: 224 lb (101.6 kg)  Height: 5\' 7"  (1.702 m)     Body mass index is 35.08 kg/m. Wt Readings from Last 3 Encounters:  03/20/20 224 lb (101.6 kg)  10/04/19 221 lb 12.8 oz (100.6 kg)  08/23/18 181 lb (82.1 kg)     Patient is in no distress; well developed, nourished and groomed; neck is supple  CARDIOVASCULAR:  Examination of carotid arteries is normal; no carotid bruits  Regular rate and rhythm, no murmurs  Examination of peripheral vascular system by observation and palpation is normal  EYES:  Ophthalmoscopic exam of optic discs and posterior segments is normal; no papilledema or hemorrhages  No exam data present  MUSCULOSKELETAL:  Gait, strength, tone, movements noted in Neurologic exam below  NEUROLOGIC: MENTAL STATUS:  No flowsheet data found.  awake, alert, oriented to person, place and time  recent and remote memory intact  normal attention and concentration  language fluent, comprehension intact, naming intact  fund of knowledge appropriate  CRANIAL NERVE:   2nd - no papilledema on fundoscopic exam  2nd, 3rd, 4th, 6th - pupils equal and reactive to light, visual fields full to confrontation, extraocular muscles intact, no nystagmus  5th - facial sensation symmetric  7th - facial strength symmetric  8th - hearing intact  9th - palate elevates  symmetrically, uvula midline  11th - shoulder shrug symmetric  12th - tongue protrusion midline  MOTOR:   normal bulk and tone, full strength in the BUE, BLE  SENSORY:   normal and symmetric to light touch, temperature, vibration  COORDINATION:   finger-nose-finger, fine finger movements normal  REFLEXES:   deep tendon reflexes present and symmetric  GAIT/STATION:   narrow based gait     DIAGNOSTIC DATA (LABS, IMAGING, TESTING) - I reviewed patient records, labs, notes, testing and imaging myself where available.  Lab Results  Component Value Date   WBC 10.6 (H) 07/20/2016   HGB 12.7 07/20/2016   HCT 37.6 07/20/2016   MCV 83.7 07/20/2016   PLT 281.0 07/20/2016      Component Value Date/Time   NA 140 10/04/2017 1328   K 4.3 10/04/2017 1328   CL 104 10/04/2017 1328   CO2 22 10/04/2017 1328   GLUCOSE 90 10/04/2017 1328   BUN 15 10/04/2017 1328   CREATININE 0.94 10/04/2017 1328   CALCIUM 9.5 10/04/2017 1328   PROT 6.7 05/12/2017 0936   ALBUMIN 4.1 05/12/2017 0936   AST 19 05/12/2017 0936   ALT 20 05/12/2017 0936   ALKPHOS 65 05/12/2017 0936   BILITOT 0.5 05/12/2017 0936   GFRNONAA 70 10/04/2017 1328   GFRAA 81 10/04/2017 1328   No results found for: CHOL, HDL, LDLCALC, LDLDIRECT, TRIG, CHOLHDL Lab Results  Component Value Date   HGBA1C 5.0 10/04/2017   No results found for: VITAMINB12 Lab Results  Component Value Date   TSH 2.52 10/04/2017       ASSESSMENT AND PLAN  54 y.o. year old female here with increasing headaches in the past 8 months with daily dull headaches as well as intermittent severe headaches 2-3 times per week.  May represent change in migraine versus other secondary cause.  We will proceed with further work-up to rule out causes with MRI of the brain.  Also will start migraine treatments.  Dx:  1. New  onset headache   2. Migraine with aura and without status migrainosus, not intractable      PLAN:  - new onset  headaches; change in type and frequency; check MRI brain   MIGRAINE TREATMENT PLAN:  MIGRAINE PREVENTION  LIFESTYLE CHANGES -Stop or avoid smoking -Decrease or avoid caffeine / alcohol -Eat and sleep on a regular schedule -Exercise several times per week - start topiramate 50mg  at bedtime; after 1-2 weeks increase to 50mg  twice a day; drink plenty of water - consider fremanezumab (Ajovy) 225mg  monthly (or 675mg  every 3 months)   MIGRAINE RESCUE  - tylenol as needed  - start rizatriptan (Maxalt) 10mg  as needed for breakthrough headache; may repeat x 1 after 2 hours; max 2 tabs per day or 8 per month - consider rimegepant (Nurtec) 75mg  as needed for breakthrough headache; max 8 per month   Orders Placed This Encounter  Procedures  . MR BRAIN W WO CONTRAST    Meds ordered this encounter  Medications  . topiramate (TOPAMAX) 50 MG tablet    Sig: Take 1 tablet (50 mg total) by mouth 2 (two) times daily.    Dispense:  60 tablet    Refill:  12  . rizatriptan (MAXALT-MLT) 10 MG disintegrating tablet    Sig: Take 1 tablet (10 mg total) by mouth as needed for migraine. May repeat in 2 hours if needed    Dispense:  9 tablet    Refill:  11  . diazepam (VALIUM) 2 MG tablet    Sig: Take 1 tablet (2 mg total) by mouth as needed for anxiety (for MRI scan).    Dispense:  2 tablet    Refill:  0   Return in about 4 months (around 07/20/2020) for with NP (Amy Lomax).    Penni Bombard, MD 04/15/3663, 40:34 AM Certified in Neurology, Neurophysiology and Neuroimaging  [facility] [address], [place], [place] [address] [phone]

## 2020-03-21 ENCOUNTER — Telehealth: Payer: Self-pay

## 2020-03-21 MED ORDER — EMGALITY 120 MG/ML ~~LOC~~ SOAJ: 120.0000 mg | SUBCUTANEOUS | 4 refills | Status: DC

## 2020-03-21 NOTE — Telephone Encounter (Signed)
Spoke with patient. We discussed Emgality. We discussed directions. Pt aware that each box will come with detailed instructions as well. Her questions were answered. She is aware of the proper storage of the medication in refrigerator. Pt aware a PA will be needed and it may take a few days to hear back from insurance. Also discussed the savings program.

## 2020-03-21 NOTE — Telephone Encounter (Signed)
Pt called office. She reports having tried topiramate in a weight loss drug in the past and it caused significant memory loss and cognitive side effects. She would like topiramate to be added to her allergy list.  She is wondering if Dr. Leta Baptist could recommend another preventative instead.

## 2020-03-21 NOTE — Telephone Encounter (Signed)
Stop topiramate. Start emgality.  Meds ordered this encounter  Medications  . Galcanezumab-gnlm (EMGALITY) 120 MG/ML SOAJ    Sig: Inject 120 mg into the skin every 30 (thirty) days. Start 240mg  injection loading dose x 1; after 1 month start 120mg  monthly injections    Dispense:  3 pen    Refill:  [place], MD 02/23/353, 6:56 PM Certified in Neurology, Neurophysiology and [facility] [address], [place], [place] [address] [phone]

## 2020-03-26 ENCOUNTER — Telehealth: Payer: Self-pay | Admitting: Diagnostic Neuroimaging

## 2020-03-26 NOTE — Telephone Encounter (Signed)
no to the covid questions MR Brain w/wo contrast Dr. Leta Baptist [facility] Auth: [id number] (exp. 03/25/20 to 05/09/20). Patient is scheduled at [facility] for 04/02/20.

## 2020-04-01 ENCOUNTER — Telehealth (INDEPENDENT_AMBULATORY_CARE_PROVIDER_SITE_OTHER): Payer: Self-pay | Admitting: *Deleted

## 2020-04-01 NOTE — Telephone Encounter (Signed)
Patient called and she is asking for Dr.Rehman to write her another prescription for the patches that he has done previously . She will be going on a trip that included riding in plane then on a ship.  Patient was advised that we would  Ask with Dr.Rehman about this.once we know we will give her a call back.  Patient uses [facility] in [place].

## 2020-04-02 ENCOUNTER — Other Ambulatory Visit (INDEPENDENT_AMBULATORY_CARE_PROVIDER_SITE_OTHER): Payer: Self-pay | Admitting: *Deleted

## 2020-04-02 ENCOUNTER — Ambulatory Visit: Payer: 59

## 2020-04-02 DIAGNOSIS — R519 Headache, unspecified: Secondary | ICD-10-CM | POA: Diagnosis not present

## 2020-04-02 DIAGNOSIS — Z87898 Personal history of other specified conditions: Secondary | ICD-10-CM

## 2020-04-02 MED ORDER — SCOPOLAMINE 1 MG/3DAYS TD PT72: 1.0000 | MEDICATED_PATCH | TRANSDERMAL | Status: AC

## 2020-04-02 MED ORDER — GADOBENATE DIMEGLUMINE 529 MG/ML IV SOLN
20.0000 mL | Freq: Once | INTRAVENOUS | Status: AC | PRN
Start: 2020-04-02 — End: 2020-04-02
  Administered 2020-04-02: 20 mL via INTRAVENOUS

## 2020-04-02 NOTE — Telephone Encounter (Signed)
Per Dr.Rehman may call in to patients pharmacy , Scopolamine 1.5 mg patch. Apply patch to skin every 3 days. 10 doses. [facility] in [place]. Patient was made aware.

## 2020-04-03 ENCOUNTER — Other Ambulatory Visit (INDEPENDENT_AMBULATORY_CARE_PROVIDER_SITE_OTHER): Payer: Self-pay | Admitting: Internal Medicine

## 2020-04-03 ENCOUNTER — Telehealth (INDEPENDENT_AMBULATORY_CARE_PROVIDER_SITE_OTHER): Payer: Self-pay | Admitting: Internal Medicine

## 2020-04-03 ENCOUNTER — Encounter (INDEPENDENT_AMBULATORY_CARE_PROVIDER_SITE_OTHER): Payer: Self-pay

## 2020-04-03 ENCOUNTER — Encounter: Payer: Self-pay | Admitting: *Deleted

## 2020-04-03 NOTE — Telephone Encounter (Signed)
Patient left voice mail message stating her script for patches has not been sent to her pharmacy - she uses [facility] in [place] - please advise [phone]

## 2020-04-03 NOTE — Telephone Encounter (Signed)
MyChart message sent stating what Kimberly Ortega mentioned

## 2020-04-03 NOTE — Telephone Encounter (Signed)
The prescription was sent in electronically yesterday and then this morning her pharmacy sent to [name] a refill for it along with Miralax. This was addressed and sent back to them. Please advise the patient.

## 2020-04-09 ENCOUNTER — Telehealth: Payer: Self-pay | Admitting: *Deleted

## 2020-04-09 NOTE — Telephone Encounter (Signed)
Emgality PA, key: [id number], [id number], answered clinical questions.

## 2020-04-09 NOTE — Telephone Encounter (Signed)
Received approval of Emgality until 10/10/2020.  Approval faxed to pharmacy; patient notified via my chart.

## 2020-06-20 DIAGNOSIS — Z975 Presence of (intrauterine) contraceptive device: Secondary | ICD-10-CM | POA: Insufficient documentation

## 2020-06-20 DIAGNOSIS — Z800 Family history of malignant neoplasm of digestive organs: Secondary | ICD-10-CM | POA: Insufficient documentation

## 2020-06-20 DIAGNOSIS — Z803 Family history of malignant neoplasm of breast: Secondary | ICD-10-CM | POA: Insufficient documentation

## 2020-07-16 ENCOUNTER — Encounter (INDEPENDENT_AMBULATORY_CARE_PROVIDER_SITE_OTHER): Payer: Self-pay | Admitting: Internal Medicine

## 2020-07-16 ENCOUNTER — Other Ambulatory Visit: Payer: Self-pay

## 2020-07-16 ENCOUNTER — Ambulatory Visit (INDEPENDENT_AMBULATORY_CARE_PROVIDER_SITE_OTHER): Payer: 59 | Admitting: Internal Medicine

## 2020-07-16 ENCOUNTER — Other Ambulatory Visit (INDEPENDENT_AMBULATORY_CARE_PROVIDER_SITE_OTHER): Payer: Self-pay | Admitting: *Deleted

## 2020-07-16 ENCOUNTER — Telehealth (INDEPENDENT_AMBULATORY_CARE_PROVIDER_SITE_OTHER): Payer: Self-pay

## 2020-07-16 VITALS — BP 132/87 | HR 76 | Temp 99.6°F | Ht 67.0 in | Wt 246.9 lb

## 2020-07-16 DIAGNOSIS — R42 Dizziness and giddiness: Secondary | ICD-10-CM | POA: Insufficient documentation

## 2020-07-16 DIAGNOSIS — R109 Unspecified abdominal pain: Secondary | ICD-10-CM

## 2020-07-16 DIAGNOSIS — R635 Abnormal weight gain: Secondary | ICD-10-CM

## 2020-07-16 DIAGNOSIS — K219 Gastro-esophageal reflux disease without esophagitis: Secondary | ICD-10-CM

## 2020-07-16 NOTE — Telephone Encounter (Signed)
Opened in error

## 2020-07-16 NOTE — Progress Notes (Signed)
Presenting complaint;  Abdominal pain and lightheadedness. History of GERD and gastroparesis.  Database and subjective:  Patient is 54 year old Caucasian female with history of GERD gastroparesis as well as left-sided abdominal pain who is here for scheduled visit. She was last seen in the office on 10/04/2019. She was begun on hyoscyamine sublingual to be used on as-needed basis. She was noted to have gained 40 pounds since her visit of December 2019 when she weighed 181 pounds. Patient was advised to go to the lab for CBC with differential, TSH and urinalysis. She did not have these tests done as recommended and canceled her appointment for 01/30/2020.  Today she returns with her daughter Jinny Blossom. She complains of pain in left upper quadrant as well as left lower quadrant and periumbilical region. She says the pain in the left upper quadrant feels like a bad muscle cramp. Another day she has severe cramping and she felt better when she stretched her body. She has tried hyoscyamine sublingual but it did not help. She denies nausea vomiting. He used to be constipated and was using polyethylene glycol but now she is having diarrhea and is not using it anymore. She says she is having 4-5 stools per day. She denies melena or rectal bleeding. She continues to complain of lightheadedness when she stands from bending position or when she gets up from bed. She says heartburn is well controlled with PPI and she is not taking Pepcid anymore. She says Dr. Barrie Dunker has been managing her anxiety and depression. She feels her depression is poorly controlled. She says she eats all the time. She feels this is result of her depression. She has gained another 25 pounds since her last visit of October 04, 2019. She does not do any walking or exercise She lives alone. Her daughter works with she is there for her as often as she can. Patient says she has a sister who lives in [place] but she does not have much interaction with  her. Patient says her migraine control is improved with new medication. She has now no more than 2-3 episodes per month. She is worried because of paternal grandmother had pancreatic carcinoma but she was in her 69s and died within 3 months. Patient says she is not having suicidal thoughts.  Current Medications: Outpatient Encounter Medications as of 07/16/2020  Medication Sig   acetaminophen (TYLENOL) 500 MG tablet Take 1,000 mg by mouth every 6 (six) hours as needed.   ALPRAZolam (XANAX) 1 MG tablet Take 1 mg by mouth 3 (three) times daily as needed for anxiety.    Biotin w/ Vitamins C & E (HAIR/SKIN/NAILS PO) Take 3 each by mouth daily. Chewable   Calcium Carbonate-Vitamin D (CALCIUM PLUS VITAMIN D PO) Take 2 each by mouth daily. Chewables   DEXILANT 60 MG capsule TAKE 1 CAPSULE(60 MG) BY MOUTH DAILY BEFORE BREAKFAST   famotidine (PEPCID) 20 MG tablet Take 1 tablet (20 mg total) by mouth at bedtime as needed for heartburn or indigestion.   Galcanezumab-gnlm (EMGALITY) 120 MG/ML SOAJ Inject 120 mg into the skin every 30 (thirty) days. Start 240m injection loading dose x 1; after 1 month start 1260mmonthly injections   lamoTRIgine (LAMICTAL) 100 MG tablet Take 100 mg by mouth 2 (two) times daily.   metFORMIN (GLUCOPHAGE) 500 MG tablet Take 1,000 mg by mouth daily.    MONUROL 3 g PACK Take 3 g by mouth daily as needed. Urinary tract infection   MULTIPLE VITAMIN PO Take 1 tablet by  mouth daily.   Multiple Vitamins-Minerals (MULTIVITAMIN GUMMIES ADULT PO) Take 2 each by mouth daily.   Probiotic Product (ALIGN) CHEW Chew by mouth daily. Patient chews three daily.   rizatriptan (MAXALT-MLT) 10 MG disintegrating tablet Take 1 tablet (10 mg total) by mouth as needed for migraine. May repeat in 2 hours if needed   hyoscyamine (LEVSIN SL) 0.125 MG SL tablet Place 1 tablet (0.125 mg total) under the tongue every 6 (six) hours as needed. (Patient not taking: Reported on 07/16/2020)    lamoTRIgine (LAMICTAL) 100 MG tablet Take 100 mg by mouth 2 (two) times daily.    polyethylene glycol powder (GLYCOLAX/MIRALAX) 17 GM/SCOOP powder TAKE 17 GRAMS BY MOUTH DAILY AS NEEDED (Patient not taking: Reported on 07/16/2020)   [DISCONTINUED] diazepam (VALIUM) 2 MG tablet Take 1 tablet (2 mg total) by mouth as needed for anxiety (for MRI scan).   [DISCONTINUED] loratadine (CLARITIN) 10 MG tablet Take 10 mg by mouth daily as needed for allergies. (Patient not taking: Reported on 07/16/2020)   [DISCONTINUED] scopolamine (TRANSDERM-SCOP) 1 MG/3DAYS PLACE 1 PATCH ONTO THE SKIN EVERY 3 DAYS. (Patient not taking: Reported on 07/16/2020)   No facility-administered encounter medications on file as of 07/16/2020.   Past Medical History:  Diagnosis Date   Abdominal pain    Allergies    Anemia    Anxiety    Arthritis    Complication of anesthesia    dizziness; almost passed out   Essential hypertension    Family history of breast cancer    Cousins on both sides of family   GERD (gastroesophageal reflux disease) 03/01/2017   History of melanoma    History of recurrent UTIs    Migraine    Multiple nevi    Obstructive sleep apnea    Panic disorder 1995   Polycystic ovarian disease    Past Surgical History:  Procedure Laterality Date   BIOPSY  04/09/2017   Procedure: BIOPSY;  Surgeon: Rogene Houston, MD;  [facility];  Service: Endoscopy;;  gastric esophageal    CHOLECYSTECTOMY  1991   COLONOSCOPY  2016   DILATION AND CURETTAGE OF UTERUS     ESOPHAGOGASTRODUODENOSCOPY N/A 04/09/2017   Procedure: ESOPHAGOGASTRODUODENOSCOPY (EGD);  Surgeon: Rogene Houston, MD;  [facility];  Service: Endoscopy;  Laterality: N/A;  9:25   LAPAROSCOPIC GASTRIC BANDING  07/07/11   LAPAROSCOPIC REPAIR AND REMOVAL OF GASTRIC BAND     UPPER GI ENDOSCOPY N/A 11/21/2012   Procedure: UPPER GI ENDOSCOPY Ileene Hutchinson DIAGNOSTIC LAPAROSCOPY /REMOVAL LAP GASTRIC  BAND/INSERTION OF DRAIN;  Surgeon: Pedro Earls, MD;  Location: [facility];  Service: General;  Laterality: N/A;      Objective: Blood pressure 132/87, pulse 76, temperature 99.6 F (37.6 C), temperature source Oral, height 5' 7" (1.702 m), weight 246 lb 14.4 oz (112 kg). Patient is alert. She is in tears. Conjunctiva is pink. Sclera is nonicteric Oropharyngeal mucosa is normal. No neck masses or thyromegaly noted. Cardiac exam with regular rhythm normal S1 and S2. No murmur or gallop noted. Lungs are clear to auscultation. Abdomen is full. Bowel sounds are normal. On palpation abdomen is soft with mild tenderness and LUQ and LLQ.  No guarding or rebound.  No organomegaly or masses. No LE edema or clubbing noted.  Labs/studies Results:  CBC Latest Ref Rng & Units 07/20/2016 11/24/2012 11/23/2012  WBC 4.0 - 10.5 K/uL 10.6(H) 8.2 11.0(H)  Hemoglobin 12.0 - 15.0 g/dL 12.7 8.6(L) 9.2(L)  Hematocrit 36 -  46 % 37.6 27.3(L) 29.5(L)  Platelets 150 - 400 K/uL 281.0 219 234    CMP Latest Ref Rng & Units 10/04/2017 05/12/2017 07/20/2016  Glucose 65 - 139 mg/dL 90 85 101(H)  BUN 7 - 25 mg/dL _0 Creatinine 0.50 - 1.05 mg/dL 0.94 0.86 0.91  Sodium 135 - 146 mmol/L 140 136 139  Potassium 3.5 - 5.3 mmol/L 4.3 3.2(L) 3.4(L)  Chloride 98 - 110 mmol/L 104 94(L) 100  CO2 20 - 32 mmol/L 22 32 31  Calcium 8.6 - 10.4 mg/dL 9.5 9.2 9.4  Total Protein 6.1 - 8.1 g/dL - 6.7 7.1  Total Bilirubin 0.2 - 1.2 mg/dL - 0.5 0.3  Alkaline Phos 33 - 130 U/L - 65 71  AST 10 - 35 U/L - 19 20  ALT 6 - 29 U/L - 20 23    Hepatic Function Latest Ref Rng & Units 05/12/2017 07/20/2016 11/22/2012  Total Protein 6.1 - 8.1 g/dL 6.7 7.1 6.4  Albumin 3.6 - 5.1 g/dL 4.1 3.9 3.1(L)  AST 10 - 35 U/L _1 ALT 6 - 29 U/L _2 Alk Phosphatase 33 - 130 U/L 65 71 59  Total Bilirubin 0.2 - 1.2 mg/dL 0.5 0.3 0.5  Bilirubin, Direct 0.0 - 0.3 mg/dL - 0.1 -      Assessment:  #1.  Chronic abdominal pain.  She has  abdominal pain located in left upper, left lower quadrant as well as periumbilical region.  I suspect some of her pain is musculoskeletal and rest may be due to IBS and may somehow be related to gastric pain which subsequently had to be removed because of erosion.  EGD back in 2018 did not revea ulceration. She may benefit from low-dose tricyclic antidepressant but would hold off this therapy for now.   #2.  GERD and gastroparesis.  GERD symptoms are well controlled with therapy.  She is definitely not have any symptoms of gastroparesis because she is not having any nausea early satiety while she is consuming excess amount of calories.  #3.  Postural lightheadedness.  Will rule out anemia she may eventually need evaluation by cardiologist of the symptoms persist.  #4.  Weight gain.  She has gained 65 pounds and is less than 2 years.  She needs to be screened for hypothyroidism.  Weight gain may be merely due to excessive intake of food triggered by depression.  #5.  Diarrhea.  She used to be constipated and now she is having diarrhea.  Diarrhea is either due to IBS or relative malabsorption because she is eating too much and all of the food cannot be processed by her small bowel.  She cannot have an absolute malabsorption because of massive weight gain.  #6.  Depression.  She will benefit from consultation with psychiatrist as soon as possible.   Plan:  Patient will go to the lab for CBC with differential, TSH, serum magnesium and comprehensive chemistry panel. Abdominal pelvic CT with contrast. Patient encouraged to increase physical activity.  Her daughter is going to try to help her in accompanying her so that she can start walking. Psychiatric consultation as soon as possible. Patient can take Tylenol up to 2 g/day in divided dose and see if it helps abdominal pain. Office visit in 3 months.

## 2020-07-16 NOTE — Patient Instructions (Addendum)
Psychiatric consultation to be arranged. Physician will call with results of CT and blood work when completed. Can take Tylenol up to 2 g/day in divided doses as needed for abdominal pain

## 2020-07-17 LAB — CBC
HCT: 40.8 % (ref 35.0–45.0)
Hemoglobin: 13.6 g/dL (ref 11.7–15.5)
MCH: 29.2 pg (ref 27.0–33.0)
MCHC: 33.3 g/dL (ref 32.0–36.0)
MCV: 87.7 fL (ref 80.0–100.0)
MPV: 10.2 fL (ref 7.5–12.5)
Platelets: 299 10*3/uL (ref 140–400)
RBC: 4.65 10*6/uL (ref 3.80–5.10)
RDW: 12.7 % (ref 11.0–15.0)
WBC: 8.8 10*3/uL (ref 3.8–10.8)

## 2020-07-17 LAB — COMPREHENSIVE METABOLIC PANEL
AG Ratio: 1.4 (calc) (ref 1.0–2.5)
ALT: 14 U/L (ref 6–29)
AST: 15 U/L (ref 10–35)
Albumin: 4.1 g/dL (ref 3.6–5.1)
Alkaline phosphatase (APISO): 79 U/L (ref 37–153)
BUN: 12 mg/dL (ref 7–25)
CO2: 30 mmol/L (ref 20–32)
Calcium: 9.5 mg/dL (ref 8.6–10.4)
Chloride: 104 mmol/L (ref 98–110)
Creat: 0.86 mg/dL (ref 0.50–1.05)
Globulin: 2.9 g/dL (calc) (ref 1.9–3.7)
Glucose, Bld: 79 mg/dL (ref 65–99)
Potassium: 4.5 mmol/L (ref 3.5–5.3)
Sodium: 140 mmol/L (ref 135–146)
Total Bilirubin: 0.4 mg/dL (ref 0.2–1.2)
Total Protein: 7 g/dL (ref 6.1–8.1)

## 2020-07-17 LAB — IRON, TOTAL/TOTAL IRON BINDING CAP
%SAT: 21 % (calc) (ref 16–45)
Iron: 84 ug/dL (ref 45–160)
TIBC: 402 mcg/dL (calc) (ref 250–450)

## 2020-07-17 LAB — FERRITIN: Ferritin: 16 ng/mL (ref 16–232)

## 2020-07-17 LAB — MAGNESIUM: Magnesium: 2.1 mg/dL (ref 1.5–2.5)

## 2020-07-17 LAB — TSH: TSH: 3.68 mIU/L

## 2020-07-22 ENCOUNTER — Encounter: Payer: Self-pay | Admitting: Family Medicine

## 2020-07-22 ENCOUNTER — Ambulatory Visit (INDEPENDENT_AMBULATORY_CARE_PROVIDER_SITE_OTHER): Payer: 59 | Admitting: Family Medicine

## 2020-07-22 ENCOUNTER — Other Ambulatory Visit: Payer: Self-pay

## 2020-07-22 VITALS — BP 118/78 | HR 60 | Ht 67.0 in | Wt 246.0 lb

## 2020-07-22 DIAGNOSIS — F32A Depression, unspecified: Secondary | ICD-10-CM

## 2020-07-22 DIAGNOSIS — G43109 Migraine with aura, not intractable, without status migrainosus: Secondary | ICD-10-CM | POA: Diagnosis not present

## 2020-07-22 DIAGNOSIS — R635 Abnormal weight gain: Secondary | ICD-10-CM

## 2020-07-22 DIAGNOSIS — R0683 Snoring: Secondary | ICD-10-CM

## 2020-07-22 DIAGNOSIS — F419 Anxiety disorder, unspecified: Secondary | ICD-10-CM | POA: Diagnosis not present

## 2020-07-22 NOTE — Patient Instructions (Addendum)
Below is our plan:  We will continue Emgality every 30 days and rizatriptan as needed.   Please make sure you are staying well hydrated. I recommend 50-60 ounces daily. Well balanced diet and regular exercise encouraged. Consider sleep study. You can call me if you wish to proceed.    Please continue follow up with care team as directed.   Follow up in 1 year, sooner if needed.   You may receive a survey regarding today's visit. I encourage you to leave honest feed back as I do use this information to improve patient care. Thank you for seeing me today!     Migraine Headache A migraine headache is a very strong throbbing pain on one side or both sides of your head. This type of headache can also cause other symptoms. It can last from 4 hours to 3 days. Talk with your doctor about what things may bring on (trigger) this condition. What are the causes? The exact cause of this condition is not known. This condition may be triggered or caused by:  Drinking alcohol.  Smoking.  Taking medicines, such as: ? Medicine used to treat chest pain (nitroglycerin). ? Birth control pills. ? Estrogen. ? Some blood pressure medicines.  Eating or drinking certain products.  Doing physical activity. Other things that may trigger a migraine headache include:  Having a menstrual period.  Pregnancy.  Hunger.  Stress.  Not getting enough sleep or getting too much sleep.  Weather changes.  Tiredness (fatigue). What increases the risk?  Being 34-18 years old.  Being female.  Having a family history of migraine headaches.  Being Caucasian.  Having depression or anxiety.  Being very overweight. What are the signs or symptoms?  A throbbing pain. This pain may: ? Happen in any area of the head, such as on one side or both sides. ? Make it hard to do daily activities. ? Get worse with physical activity. ? Get worse around bright lights or loud noises.  Other symptoms may  include: ? Feeling sick to your stomach (nauseous). ? Vomiting. ? Dizziness. ? Being sensitive to bright lights, loud noises, or smells.  Before you get a migraine headache, you may get warning signs (an aura). An aura may include: ? Seeing flashing lights or having blind spots. ? Seeing bright spots, halos, or zigzag lines. ? Having tunnel vision or blurred vision. ? Having numbness or a tingling feeling. ? Having trouble talking. ? Having weak muscles.  Some people have symptoms after a migraine headache (postdromal phase), such as: ? Tiredness. ? Trouble thinking (concentrating). How is this treated?  Taking medicines that: ? Relieve pain. ? Relieve the feeling of being sick to your stomach. ? Prevent migraine headaches.  Treatment may also include: ? Having acupuncture. ? Avoiding foods that bring on migraine headaches. ? Learning ways to control your body functions (biofeedback). ? Therapy to help you know and deal with negative thoughts (cognitive behavioral therapy). Follow these instructions at home: Medicines  Take over-the-counter and prescription medicines only as told by your doctor.  Ask your doctor if the medicine prescribed to you: ? Requires you to avoid driving or using heavy machinery. ? Can cause trouble pooping (constipation). You may need to take these steps to prevent or treat trouble pooping:  Drink enough fluid to keep your pee (urine) pale yellow.  Take over-the-counter or prescription medicines.  Eat foods that are high in fiber. These include beans, whole grains, and fresh fruits and vegetables.  Limit foods that are high in fat and sugar. These include fried or sweet foods. Lifestyle  Do not drink alcohol.  Do not use any products that contain nicotine or tobacco, such as cigarettes, e-cigarettes, and chewing tobacco. If you need help quitting, ask your doctor.  Get at least 8 hours of sleep every night.  Limit and deal with  stress. General instructions      Keep a journal to find out what may bring on your migraine headaches. For example, write down: ? What you eat and drink. ? How much sleep you get. ? Any change in what you eat or drink. ? Any change in your medicines.  If you have a migraine headache: ? Avoid things that make your symptoms worse, such as bright lights. ? It may help to lie down in a dark, quiet room. ? Do not drive or use heavy machinery. ? Ask your doctor what activities are safe for you.  Keep all follow-up visits as told by your doctor. This is important. Contact a doctor if:  You get a migraine headache that is different or worse than others you have had.  You have more than 15 headache days in one month. Get help right away if:  Your migraine headache gets very bad.  Your migraine headache lasts longer than 72 hours.  You have a fever.  You have a stiff neck.  You have trouble seeing.  Your muscles feel weak or like you cannot control them.  You start to lose your balance a lot.  You start to have trouble walking.  You pass out (faint).  You have a seizure. Summary  A migraine headache is a very strong throbbing pain on one side or both sides of your head. These headaches can also cause other symptoms.  This condition may be treated with medicines and changes to your lifestyle.  Keep a journal to find out what may bring on your migraine headaches.  Contact a doctor if you get a migraine headache that is different or worse than others you have had.  Contact your doctor if you have more than 15 headache days in a month. This information is not intended to replace advice given to you by your health care provider. Make sure you discuss any questions you have with your health care provider. Document Revised: 12/30/2018 Document Reviewed: 10/20/2018 Elsevier Patient Education  [place].     Sleep Apnea Sleep apnea affects breathing during  sleep. It causes breathing to stop for a short time or to become shallow. It can also increase the risk of:  Heart attack.  Stroke.  Being very overweight (obese).  Diabetes.  Heart failure.  Irregular heartbeat. The goal of treatment is to help you breathe normally again. What are the causes? There are three kinds of sleep apnea:  Obstructive sleep apnea. This is caused by a blocked or collapsed airway.  Central sleep apnea. This happens when the brain does not send the right signals to the muscles that control breathing.  Mixed sleep apnea. This is a combination of obstructive and central sleep apnea. The most common cause of this condition is a collapsed or blocked airway. This can happen if:  Your throat muscles are too relaxed.  Your tongue and tonsils are too large.  You are overweight.  Your airway is too small. What increases the risk?  Being overweight.  Smoking.  Having a small airway.  Being older.  Being female.  Drinking alcohol.  Taking medicines to calm yourself (sedatives or tranquilizers).  Having family members with the condition. What are the signs or symptoms?  Trouble staying asleep.  Being sleepy or tired during the day.  Getting angry a lot.  Loud snoring.  Headaches in the morning.  Not being able to focus your mind (concentrate).  Forgetting things.  Less interest in sex.  Mood swings.  Personality changes.  Feelings of sadness (depression).  Waking up a lot during the night to pee (urinate).  Dry mouth.  Sore throat. How is this diagnosed?  Your medical history.  A physical exam.  A test that is done when you are sleeping (sleep study). The test is most often done in a sleep lab but may also be done at home. How is this treated?   Sleeping on your side.  Using a medicine to get rid of mucus in your nose (decongestant).  Avoiding the use of alcohol, medicines to help you relax, or certain pain medicines  (narcotics).  Losing weight, if needed.  Changing your diet.  Not smoking.  Using a machine to open your airway while you sleep, such as: ? An oral appliance. This is a mouthpiece that shifts your lower jaw forward. ? A CPAP device. This device blows air through a mask when you breathe out (exhale). ? An EPAP device. This has valves that you put in each nostril. ? A BPAP device. This device blows air through a mask when you breathe in (inhale) and breathe out.  Having surgery if other treatments do not work. It is important to get treatment for sleep apnea. Without treatment, it can lead to:  High blood pressure.  Coronary artery disease.  In men, not being able to have an erection (impotence).  Reduced thinking ability. Follow these instructions at home: Lifestyle  Make changes that your doctor recommends.  Eat a healthy diet.  Lose weight if needed.  Avoid alcohol, medicines to help you relax, and some pain medicines.  Do not use any products that contain nicotine or tobacco, such as cigarettes, e-cigarettes, and chewing tobacco. If you need help quitting, ask your doctor. General instructions  Take over-the-counter and prescription medicines only as told by your doctor.  If you were given a machine to use while you sleep, use it only as told by your doctor.  If you are having surgery, make sure to tell your doctor you have sleep apnea. You may need to bring your device with you.  Keep all follow-up visits as told by your doctor. This is important. Contact a doctor if:  The machine that you were given to use during sleep bothers you or does not seem to be working.  You do not get better.  You get worse. Get help right away if:  Your chest hurts.  You have trouble breathing in enough air.  You have an uncomfortable feeling in your back, arms, or stomach.  You have trouble talking.  One side of your body feels weak.  A part of your face is hanging  down. These symptoms may be an emergency. Do not wait to see if the symptoms will go away. Get medical help right away. Call your local emergency services (911 in the [place] Do not drive yourself to the hospital. Summary  This condition affects breathing during sleep.  The most common cause is a collapsed or blocked airway.  The goal of treatment is to help you breathe normally while you sleep. This information is not  intended to replace advice given to you by your health care provider. Make sure you discuss any questions you have with your health care provider. Document Revised: 06/24/2018 Document Reviewed: 05/03/2018 Elsevier Patient Education  [place].

## 2020-07-22 NOTE — Progress Notes (Signed)
Chief Complaint  Patient presents with  . Follow-up    [facility]  . Migraine    pt here for a f/u on migraine medication. Pt has no new sx     HISTORY OF PRESENT ILLNESS: Today 07/22/20  Kimberly Ortega is a 54 y.o. female here today for follow up for migraines. MRI normal. She has failed topiramate in the past so she was started on Emgality. She has about 4-5 headache days a month. She feels rizatripan works fairly well. She may have to repeat a dose but usually she can ease migraine. She knows that stress is main trigger. She has gained weight (about 60 pounds). She plans to see psychiatry in the near future. She does worry about possible sleep apnea but wishes to manage mood and weight prior to having sleep study. She is worried about cost of sleep study.    HISTORY (copied from Dr Gladstone Lighter note on 03/20/2020)  54 year old female here for evaluation of headaches.  History of anxiety and panic attacks.  Patient has history of migraine since age 35 years old with throbbing headaches, seeing squiggly lines, nausea and photophobia.  Patient manages conservatively with over-the-counter medication and rest.  Over past 8 months patient had increasing frequency and severity of headaches now having 2-3 severe headaches per week.  Also with daily dull nagging headaches.  Sometimes has nausea.  Headaches can be right or left-sided, sometimes around her eyes.  Triggers include stress and lack of sleep.  Patient taking Tylenol almost on daily basis.  Sometimes uses ice packs.   REVIEW OF SYSTEMS: Out of a complete 14 system review of symptoms, the patient complains only of the following symptoms, headaches, morning headaches, anxiety, panic attacks, snoring, daytime sleepiness, weight gain and all other reviewed systems are negative.   ALLERGIES: Allergies  Allergen Reactions  . Ibuprofen Shortness Of Breath and Other (See Comments)    Lung specialist told her not to take   . Macrobid  [Nitrofurantoin Macrocrystal] Other (See Comments)    Lung specialist Told it would kill her  SOB  . Morphine And Related Nausea Only    dizziness  . Topiramate Other (See Comments)    Cognitive side effects.  . Tramadol     Low blood pressure  . Other Palpitations    PSEUDO COUGH DECONGESTANT     HOME MEDICATIONS: Outpatient Medications Prior to Visit  Medication Sig Dispense Refill  . acetaminophen (TYLENOL) 500 MG tablet Take 1,000 mg by mouth every 6 (six) hours as needed.    . ALPRAZolam (XANAX) 1 MG tablet Take 1 mg by mouth 3 (three) times daily as needed for anxiety.     . Biotin w/ Vitamins C & E (HAIR/SKIN/NAILS PO) Take 3 each by mouth daily. Chewable    . Calcium Carbonate-Vitamin D (CALCIUM PLUS VITAMIN D PO) Take 2 each by mouth daily. Chewables    . DEXILANT 60 MG capsule TAKE 1 CAPSULE(60 MG) BY MOUTH DAILY BEFORE BREAKFAST 30 capsule 3  . famotidine (PEPCID) 20 MG tablet Take 1 tablet (20 mg total) by mouth at bedtime as needed for heartburn or indigestion.    . Galcanezumab-gnlm (EMGALITY) 120 MG/ML SOAJ Inject 120 mg into the skin every 30 (thirty) days. Start 240mg  injection loading dose x 1; after 1 month start 120mg  monthly injections 3 pen 4  . lamoTRIgine (LAMICTAL) 100 MG tablet Take 100 mg by mouth 2 (two) times daily.     Marland Kitchen lamoTRIgine (LAMICTAL) 100  MG tablet Take 100 mg by mouth 2 (two) times daily.    . metFORMIN (GLUCOPHAGE) 500 MG tablet Take 1,000 mg by mouth daily.     Marland Kitchen MONUROL 3 g PACK Take 3 g by mouth daily as needed. Urinary tract infection  0  . MULTIPLE VITAMIN PO Take 1 tablet by mouth daily.    . Multiple Vitamins-Minerals (MULTIVITAMIN GUMMIES ADULT PO) Take 2 each by mouth daily.    . polyethylene glycol powder (GLYCOLAX/MIRALAX) 17 GM/SCOOP powder TAKE 17 GRAMS BY MOUTH DAILY AS NEEDED (Patient not taking: Reported on 07/16/2020) 238 g 11  . Probiotic Product (ALIGN) CHEW Chew by mouth daily. Patient chews three daily.    . rizatriptan  (MAXALT-MLT) 10 MG disintegrating tablet Take 1 tablet (10 mg total) by mouth as needed for migraine. May repeat in 2 hours if needed 9 tablet 11   No facility-administered medications prior to visit.     PAST MEDICAL HISTORY: Past Medical History:  Diagnosis Date  . Abdominal pain   . Allergies   . Anemia   . Anxiety   . Arthritis   . Complication of anesthesia    dizziness; almost passed out  . Essential hypertension   . Family history of breast cancer    Cousins on both sides of family  . GERD (gastroesophageal reflux disease) 03/01/2017  . History of melanoma   . History of recurrent UTIs   . Migraine   . Multiple nevi   . Obstructive sleep apnea   . Panic disorder 1995  . Polycystic ovarian disease      PAST SURGICAL HISTORY: Past Surgical History:  Procedure Laterality Date  . BIOPSY  04/09/2017   Procedure: BIOPSY;  Surgeon: Rogene Houston, MD;  Location: [facility];  Service: Endoscopy;;  gastric esophageal   . CHOLECYSTECTOMY  1991  . COLONOSCOPY  2016  . DILATION AND CURETTAGE OF UTERUS    . ESOPHAGOGASTRODUODENOSCOPY N/A 04/09/2017   Procedure: ESOPHAGOGASTRODUODENOSCOPY (EGD);  Surgeon: Rogene Houston, MD;  Location: [facility];  Service: Endoscopy;  Laterality: N/A;  9:25  . LAPAROSCOPIC GASTRIC BANDING  07/07/11  . LAPAROSCOPIC REPAIR AND REMOVAL OF GASTRIC BAND    . UPPER GI ENDOSCOPY N/A 11/21/2012   Procedure: UPPER GI ENDOSCOPY Ileene Hutchinson DIAGNOSTIC LAPAROSCOPY /REMOVAL LAP GASTRIC BAND/INSERTION OF DRAIN;  Surgeon: Pedro Earls, MD;  Location: [facility];  Service: General;  Laterality: N/A;     FAMILY HISTORY: Family History  Problem Relation Age of Onset  . Cervical cancer Mother   . Stroke Mother   . Aneurysm Mother   . Cervical cancer Sister   . Breast cancer Cousin        Both sides of family     SOCIAL HISTORY: Social History   Socioeconomic History  . Marital status: Married    Spouse name: Not on file  . Number of  children: 1  . Years of education: Not on file  . Highest education level: Some college, no degree  Occupational History    Comment: na  Tobacco Use  . Smoking status: Never Smoker  . Smokeless tobacco: Never Used  Substance and Sexual Activity  . Alcohol use: Yes    Comment: wine/liquor maybe once a month  . Drug use: No  . Sexual activity: Yes    Birth control/protection: I.U.D.  Other Topics Concern  . Not on file  Social History Narrative   Lives with spouse   Caffeine- none   Social  Determinants of Health   Financial Resource Strain:   . Difficulty of Paying Living Expenses: Not on file  Food Insecurity:   . Worried About Charity fundraiser in the Last Year: Not on file  . Ran Out of Food in the Last Year: Not on file  Transportation Needs:   . Lack of Transportation (Medical): Not on file  . Lack of Transportation (Non-Medical): Not on file  Physical Activity:   . Days of Exercise per Week: Not on file  . Minutes of Exercise per Session: Not on file  Stress:   . Feeling of Stress : Not on file  Social Connections:   . Frequency of Communication with Friends and Family: Not on file  . Frequency of Social Gatherings with Friends and Family: Not on file  . Attends Religious Services: Not on file  . Active Member of Clubs or Organizations: Not on file  . Attends Archivist Meetings: Not on file  . Marital Status: Not on file  Intimate Partner Violence:   . Fear of Current or Ex-Partner: Not on file  . Emotionally Abused: Not on file  . Physically Abused: Not on file  . Sexually Abused: Not on file      PHYSICAL EXAM  Vitals:   07/22/20 1505  BP: 118/78  Pulse: 60  Weight: 246 lb (111.6 kg)  Height: 5\' 7"  (1.702 m)   Body mass index is 38.53 kg/m.   Generalized: Well developed, in no acute distress  Respiratory: Clear to auscultation bilaterally Cardiology: Normal rate and rhythm, no murmur auscultated Neurological examination    Mentation: Alert oriented to time, place, history taking. Follows all commands speech and language fluent Cranial nerve II-XII: Pupils were equal round reactive to light. Extraocular movements were full, visual field were full  Motor: The motor testing reveals 5 over 5 strength of all 4 extremities. Good symmetric motor tone is noted throughout.  Gait and station: Gait is normal.      DIAGNOSTIC DATA (LABS, IMAGING, TESTING) - I reviewed patient records, labs, notes, testing and imaging myself where available.  Lab Results  Component Value Date   WBC 8.8 07/16/2020   HGB 13.6 07/16/2020   HCT 40.8 07/16/2020   MCV 87.7 07/16/2020   PLT 299 07/16/2020      Component Value Date/Time   NA 140 07/16/2020 1339   K 4.5 07/16/2020 1339   CL 104 07/16/2020 1339   CO2 30 07/16/2020 1339   GLUCOSE 79 07/16/2020 1339   BUN 12 07/16/2020 1339   CREATININE 0.86 07/16/2020 1339   CALCIUM 9.5 07/16/2020 1339   PROT 7.0 07/16/2020 1339   ALBUMIN 4.1 05/12/2017 0936   AST 15 07/16/2020 1339   ALT 14 07/16/2020 1339   ALKPHOS 65 05/12/2017 0936   BILITOT 0.4 07/16/2020 1339   GFRNONAA 70 10/04/2017 1328   GFRAA 81 10/04/2017 1328   No results found for: CHOL, HDL, LDLCALC, LDLDIRECT, TRIG, CHOLHDL Lab Results  Component Value Date   HGBA1C 5.0 10/04/2017   No results found for: VITAMINB12 Lab Results  Component Value Date   TSH 3.68 07/16/2020      ASSESSMENT AND PLAN  54 y.o. year old female  has a past medical history of Abdominal pain, Allergies, Anemia, Anxiety, Arthritis, Complication of anesthesia, Essential hypertension, Family history of breast cancer, GERD (gastroesophageal reflux disease) (03/01/2017), History of melanoma, History of recurrent UTIs, Migraine, Multiple nevi, Obstructive sleep apnea, Panic disorder (1995), and Polycystic ovarian disease.  here with   Migraine with aura and without status migrainosus, not intractable  Anxiety and depression  Weight  gain  Snoring  Mrs. Rund reports that headaches are better on Emgality and rizatriptan.  We will continue current treatment plan.  We have discussed multiple factors that could be contributing to headaches.  She plans to see psychiatry in the upcoming weeks.  Healthy lifestyle habits encouraged for weight management.  Regular exercise will help with stress management.  We have discussed relationship of sleep apnea to headaches.  I feel that she would benefit from a sleep study, however, she is very concerned about the cost of her work-up.  She is aware that she may call me for sleep referral in the future if she changes her mind.  She will follow up with primary care as directed.  She will return to see me in 1 year, sooner if needed.  She verbalizes understanding and agreement with this plan.  I spent 20 minutes of face-to-face and non-face-to-face time with patient.  This included previsit chart review, lab review, study review, order entry, electronic health record documentation, patient education.    Debbora Presto, MSN, FNP-C 07/22/2020, 3:31 PM  [facility] [address], [place], [place] [address] [phone]

## 2020-08-07 ENCOUNTER — Other Ambulatory Visit: Payer: Self-pay

## 2020-08-07 ENCOUNTER — Ambulatory Visit (HOSPITAL_COMMUNITY)
Admission: RE | Admit: 2020-08-07 | Discharge: 2020-08-07 | Disposition: A | Discharge status: Home / Self Care | Payer: 59 | Source: Ambulatory Visit | Attending: Internal Medicine | Admitting: Internal Medicine

## 2020-08-07 DIAGNOSIS — R109 Unspecified abdominal pain: Secondary | ICD-10-CM | POA: Insufficient documentation

## 2020-08-07 MED ORDER — IOHEXOL 300 MG/ML  SOLN
100.0000 mL | Freq: Once | INTRAMUSCULAR | Status: AC | PRN
  Administered 2020-08-07: 100 mL via INTRAVENOUS

## 2020-08-13 ENCOUNTER — Telehealth (INDEPENDENT_AMBULATORY_CARE_PROVIDER_SITE_OTHER): Payer: Self-pay | Admitting: Internal Medicine

## 2020-08-13 NOTE — Telephone Encounter (Signed)
Patient called and I went over CT findings with her. I do not believe DJD changes in spine enough to explain LUQ pain

## 2020-08-13 NOTE — Progress Notes (Signed)
I reviewed note and agree with plan.   Penni Bombard, MD 64/29/0379, 5:58 PM Certified in Neurology, Neurophysiology and Neuroimaging  [facility] [address] [place], [place] [address] [phone]

## 2020-08-14 NOTE — Telephone Encounter (Signed)
Report faxed to PCP 

## 2020-09-24 ENCOUNTER — Telehealth: Payer: Self-pay | Admitting: Neurology

## 2020-09-24 NOTE — Telephone Encounter (Signed)
PA completed through CMM/ [employer]. [id number] Will await response

## 2020-09-25 NOTE — Telephone Encounter (Signed)
PA Approved "Request Reference Number: [id number]. EMGALITY INJ 120MG /ML is approved through 09/24/2021. Your patient may now fill this prescription and it will be covered."

## 2020-11-19 ENCOUNTER — Other Ambulatory Visit (INDEPENDENT_AMBULATORY_CARE_PROVIDER_SITE_OTHER): Payer: Self-pay | Admitting: Internal Medicine

## 2020-11-19 DIAGNOSIS — K219 Gastro-esophageal reflux disease without esophagitis: Secondary | ICD-10-CM

## 2021-03-03 ENCOUNTER — Other Ambulatory Visit (INDEPENDENT_AMBULATORY_CARE_PROVIDER_SITE_OTHER): Payer: Self-pay

## 2021-03-03 DIAGNOSIS — K219 Gastro-esophageal reflux disease without esophagitis: Secondary | ICD-10-CM

## 2021-03-03 MED ORDER — DEXLANSOPRAZOLE 60 MG PO CPDR: DELAYED_RELEASE_CAPSULE | ORAL | 3 refills | Status: DC

## 2021-04-09 ENCOUNTER — Other Ambulatory Visit: Payer: Self-pay | Admitting: Diagnostic Neuroimaging

## 2021-04-10 ENCOUNTER — Telehealth: Payer: Self-pay | Admitting: Family Medicine

## 2021-04-10 NOTE — Telephone Encounter (Signed)
Called [facility] back. Spoke w/ Gershon Mussel. Pt now has [employer]. Advised we will attempt auth via new insurance info.   ID: [id number] RXBIN: [id number] LKHVF: [id number] RXGRP: [id number] Phone# [phone]

## 2021-04-10 NOTE — Telephone Encounter (Signed)
Tom from [employer] called stating pt's Galcanezumab-gnlm (EMGALITY) 120 MG/ML SOAJ is no longer beng covered by insurance. Unable to refill it due to this issue. Gershon Mussel is requesting a call back.

## 2021-04-10 NOTE — Telephone Encounter (Signed)
Submitted PA Emgality on CMM. Key: [id number]. Waiting on determination from [employer].

## 2021-04-14 NOTE — Telephone Encounter (Signed)
Called pt. She is going to download savings card for emaglity to see if she can fill rx at pharmacy w/ this. She will call if it does not work.  She has not tried amitriptyline, venlafaxine, or beta blockers. She does not want to go on antidepressant d/t SE of weight gain. She does not want to try beta blockers. Has family x of BP issues and does not want to go on BP med since BP ok.  She has tried/failed: paxil, prozac, zoloft in the past.

## 2021-05-12 ENCOUNTER — Telehealth (INDEPENDENT_AMBULATORY_CARE_PROVIDER_SITE_OTHER): Payer: Self-pay | Admitting: *Deleted

## 2021-05-12 NOTE — Telephone Encounter (Signed)
Pt called and states at her last visit with dr Laural Golden ( on 07/16/20) he told her she needed to see psychiatrist. Pt states her insurance requires a referral and she called the psychiatrist office in this building and they are not taking new pts. She states she will have to drive her self and she needs someone local. States she is not sucidial but wanting to get some help as soon as possible for depression and panic disorder.   Copied dr Olevia Perches note from 07/16/20 below:  Depression.  She will benefit from consultation with psychiatrist as soon as possible.  [name] number is [phone]

## 2021-05-13 NOTE — Telephone Encounter (Signed)
Spoke to patient - advised her if her insurance required a referral she would need to have her PCP do referral, which they have. However, [facility] isn't taking new patients. I advised her other choice would be [facility] or [facility], she declined both

## 2021-07-10 ENCOUNTER — Telehealth: Payer: Self-pay | Admitting: Family Medicine

## 2021-07-10 NOTE — Telephone Encounter (Signed)
Pt called says she has some questions about her upcoming appt. Pt requesting a call back.

## 2021-07-10 NOTE — Telephone Encounter (Signed)
Called pt back. She has f/u with Amy on 07/23/21. Last Emgality injection 1-2 months ago, she cannot remember. Copay card no longer works. Last 3-4 weeks her migraines have worsened. She is also wondering if Amy will be ordering repeat MRI at next visit? She has met her copay and would like it done before the end of the year if possible. Aware I will send message to AL,NP. We will f/u next week if she would like to do anything prior to her upcoming appt. Pt ok to wait until appt.

## 2021-07-14 NOTE — Telephone Encounter (Signed)
Tried calling pt back, mailbox full, could not leave message.

## 2021-07-14 NOTE — Telephone Encounter (Signed)
Tried calling again, mailbox full.

## 2021-07-23 ENCOUNTER — Ambulatory Visit (INDEPENDENT_AMBULATORY_CARE_PROVIDER_SITE_OTHER): Payer: 59 | Admitting: Family Medicine

## 2021-07-23 VITALS — BP 146/96 | HR 60 | Ht 67.0 in | Wt 224.2 lb

## 2021-07-23 DIAGNOSIS — G43109 Migraine with aura, not intractable, without status migrainosus: Secondary | ICD-10-CM

## 2021-07-23 NOTE — Patient Instructions (Signed)
Below is our plan:  We will see if we can get Botox covered for migraine prevention. Continue rizatriptan for migraine abortion. Please keep an eye on your blood pressure readings at home. Please continue follow up closely with PCP for anxiety management.   Please make sure you are staying well hydrated. I recommend 50-60 ounces daily. Well balanced diet and regular exercise encouraged. Consistent sleep schedule with 6-8 hours recommended.   Please continue follow up with care team as directed.   Follow up with me in 6 months   You may receive a survey regarding today's visit. I encourage you to leave honest feed back as I do use this information to improve patient care. Thank you for seeing me today!

## 2021-07-23 NOTE — Progress Notes (Addendum)
Chief Complaint  Patient presents with   Follow-up    New room, alone. Migraines worse. Feels like head is going to explode during some episodes. Getting about one migraine per week but they last about 3 days on average. Migraines are more severe. Gets nauseated/feels like she is going to pass out (Has had a few of these spells/random, no headache during these).      HISTORY OF PRESENT ILLNESS: 07/23/21 ALL: Kimberly Ortega returns for follow up for migraines. She continued Emgality until about 2 months ago. She reports that she can no longer afford injection due to co pay card not working. She reports that we did not approve PA. She did not feel it was helping. She is having at least 20 headache days a month and all are migrainous. Rizatriptan does seem to help some.   Medications tried and failed: Emgality, topiramate, lamotrigine, rizatriptan   07/22/2020 ALL:  Kimberly Ortega is a 55 y.o. female here today for follow up for migraines. MRI normal. She has failed topiramate in the past so she was started on Emgality. She has about 4-5 headache days a month. She feels rizatripan works fairly well. She may have to repeat a dose but usually she can ease migraine. She knows that stress is main trigger. She has gained weight (about 60 pounds). She plans to see psychiatry in the near future. She does worry about possible sleep apnea but wishes to manage mood and weight prior to having sleep study. She is worried about cost of sleep study.    HISTORY (copied from Dr Gladstone Lighter note on 03/20/2020)  55 year old female here for evaluation of headaches.  History of anxiety and panic attacks.   Patient has history of migraine since age 11 years old with throbbing headaches, seeing squiggly lines, nausea and photophobia.  Patient manages conservatively with over-the-counter medication and rest.   Over past 8 months patient had increasing frequency and severity of headaches now having 2-3 severe headaches per  week.  Also with daily dull nagging headaches.  Sometimes has nausea.  Headaches can be right or left-sided, sometimes around her eyes.  Triggers include stress and lack of sleep.  Patient taking Tylenol almost on daily basis.  Sometimes uses ice packs.    REVIEW OF SYSTEMS: Out of a complete 14 system review of symptoms, the patient complains only of the following symptoms, headaches, morning headaches, anxiety, panic attacks, snoring, daytime sleepiness, weight gain and all other reviewed systems are negative.   ALLERGIES: Allergies  Allergen Reactions   Ibuprofen Shortness Of Breath and Other (See Comments)    Lung specialist told her not to take    Macrobid [Nitrofurantoin Macrocrystal] Other (See Comments)    Lung specialist Told it would kill her  SOB   Morphine And Related Nausea Only    dizziness   Topiramate Other (See Comments)    Cognitive side effects.   Tramadol     Low blood pressure   Other Palpitations    PSEUDO COUGH DECONGESTANT     HOME MEDICATIONS: Outpatient Medications Prior to Visit  Medication Sig Dispense Refill   acetaminophen (TYLENOL) 500 MG tablet Take 1,000 mg by mouth every 6 (six) hours as needed.     ALPRAZolam (XANAX) 1 MG tablet Take 1 mg by mouth 3 (three) times daily as needed for anxiety.      Biotin w/ Vitamins C & E (HAIR/SKIN/NAILS PO) Take 3 each by mouth daily. Chewable     Calcium Carbonate-Vitamin  D (CALCIUM PLUS VITAMIN D PO) Take 2 each by mouth daily. Chewables     dexlansoprazole (DEXILANT) 60 MG capsule TAKE 1 CAPSULE(60 MG) BY MOUTH DAILY 90 capsule 3   famotidine (PEPCID) 20 MG tablet Take 1 tablet (20 mg total) by mouth at bedtime as needed for heartburn or indigestion.     Galcanezumab-gnlm (EMGALITY) 120 MG/ML SOAJ Inject 120 mg into the skin every 30 (thirty) days. Start 240mg  injection loading dose x 1; after 1 month start 120mg  monthly injections 3 pen 4   lamoTRIgine (LAMICTAL) 100 MG tablet Take 100 mg by mouth 2 (two)  times daily.     metFORMIN (GLUCOPHAGE) 500 MG tablet Take 1,000 mg by mouth daily.      MONUROL 3 g PACK Take 3 g by mouth daily as needed. Urinary tract infection  0   MULTIPLE VITAMIN PO Take 1 tablet by mouth daily.     Multiple Vitamins-Minerals (MULTIVITAMIN GUMMIES ADULT PO) Take 2 each by mouth daily.     polyethylene glycol powder (GLYCOLAX/MIRALAX) 17 GM/SCOOP powder TAKE 17 GRAMS BY MOUTH DAILY AS NEEDED 238 g 11   Probiotic Product (ALIGN) CHEW Chew by mouth daily. Patient chews three daily.     rizatriptan (MAXALT-MLT) 10 MG disintegrating tablet TAKE 1 TABLET BY MOUTH AS NEEDED FOR MIGRAINE. MAY REPEAT IN 2 HOURS AS NEEDED 9 tablet 11   lamoTRIgine (LAMICTAL) 100 MG tablet Take 100 mg by mouth 2 (two) times daily.      No facility-administered medications prior to visit.     PAST MEDICAL HISTORY: Past Medical History:  Diagnosis Date   Abdominal pain    Allergies    Anemia    Anxiety    Arthritis    Complication of anesthesia    dizziness; almost passed out   Essential hypertension    Family history of breast cancer    Cousins on both sides of family   GERD (gastroesophageal reflux disease) 03/01/2017   History of melanoma    History of recurrent UTIs    Migraine    Multiple nevi    Obstructive sleep apnea    Panic disorder 1995   Polycystic ovarian disease      PAST SURGICAL HISTORY: Past Surgical History:  Procedure Laterality Date   BIOPSY  04/09/2017   Procedure: BIOPSY;  Surgeon: Rogene Houston, MD;  Location: [facility];  Service: Endoscopy;;  gastric esophageal    CHOLECYSTECTOMY  1991   COLONOSCOPY  2016   DILATION AND CURETTAGE OF UTERUS     ESOPHAGOGASTRODUODENOSCOPY N/A 04/09/2017   Procedure: ESOPHAGOGASTRODUODENOSCOPY (EGD);  Surgeon: Rogene Houston, MD;  Location: [facility];  Service: Endoscopy;  Laterality: N/A;  9:25   LAPAROSCOPIC GASTRIC BANDING  07/07/11   LAPAROSCOPIC REPAIR AND REMOVAL OF GASTRIC BAND     UPPER GI ENDOSCOPY  N/A 11/21/2012   Procedure: UPPER GI ENDOSCOPY Ileene Hutchinson DIAGNOSTIC LAPAROSCOPY /REMOVAL LAP GASTRIC BAND/INSERTION OF DRAIN;  Surgeon: Pedro Earls, MD;  Location: [facility];  Service: General;  Laterality: N/A;     FAMILY HISTORY: Family History  Problem Relation Age of Onset   Cervical cancer Mother    Stroke Mother    Aneurysm Mother    Cervical cancer Sister    Breast cancer Cousin        Both sides of family     SOCIAL HISTORY: Social History   Socioeconomic History   Marital status: Married    Spouse name: Not on file  Number of children: 1   Years of education: Not on file   Highest education level: Some college, no degree  Occupational History    Comment: na  Tobacco Use   Smoking status: Never   Smokeless tobacco: Never  Substance and Sexual Activity   Alcohol use: Yes    Comment: wine/liquor maybe once a month   Drug use: No   Sexual activity: Yes    Birth control/protection: I.U.D.  Other Topics Concern   Not on file  Social History Narrative   Lives with spouse   Caffeine- none   Social Determinants of Health   Financial Resource Strain: Not on file  Food Insecurity: Not on file  Transportation Needs: Not on file  Physical Activity: Not on file  Stress: Not on file  Social Connections: Not on file  Intimate Partner Violence: Not on file      PHYSICAL EXAM  Vitals:   07/23/21 1448  BP: (!) 166/101  Pulse: 79  Weight: 224 lb 3.2 oz (101.7 kg)  Height: 5\' 7"  (1.702 m)    Body mass index is 35.11 kg/m.   Generalized: Well developed, in no acute distress  Respiratory: Clear to auscultation bilaterally Cardiology: Normal rate and rhythm, no murmur auscultated Neurological examination  Mentation: Alert oriented to time, place, history taking. Follows all commands speech and language fluent Cranial nerve II-XII: Pupils were equal round reactive to light. Extraocular movements were full, visual field were full  Motor: The motor testing  reveals 5 over 5 strength of all 4 extremities. Good symmetric motor tone is noted throughout.  Gait and station: Gait is normal.     DIAGNOSTIC DATA (LABS, IMAGING, TESTING) - I reviewed patient records, labs, notes, testing and imaging myself where available.  Lab Results  Component Value Date   WBC 8.8 07/16/2020   HGB 13.6 07/16/2020   HCT 40.8 07/16/2020   MCV 87.7 07/16/2020   PLT 299 07/16/2020      Component Value Date/Time   NA 140 07/16/2020 1339   K 4.5 07/16/2020 1339   CL 104 07/16/2020 1339   CO2 30 07/16/2020 1339   GLUCOSE 79 07/16/2020 1339   BUN 12 07/16/2020 1339   CREATININE 0.86 07/16/2020 1339   CALCIUM 9.5 07/16/2020 1339   PROT 7.0 07/16/2020 1339   ALBUMIN 4.1 05/12/2017 0936   AST 15 07/16/2020 1339   ALT 14 07/16/2020 1339   ALKPHOS 65 05/12/2017 0936   BILITOT 0.4 07/16/2020 1339   GFRNONAA 70 10/04/2017 1328   GFRAA 81 10/04/2017 1328   No results found for: CHOL, HDL, LDLCALC, LDLDIRECT, TRIG, CHOLHDL Lab Results  Component Value Date   HGBA1C 5.0 10/04/2017   No results found for: VITAMINB12 Lab Results  Component Value Date   TSH 3.68 07/16/2020      ASSESSMENT AND PLAN  55 y.o. year old female  has a past medical history of Abdominal pain, Allergies, Anemia, Anxiety, Arthritis, Complication of anesthesia, Essential hypertension, Family history of breast cancer, GERD (gastroesophageal reflux disease) (03/01/2017), History of melanoma, History of recurrent UTIs, Migraine, Multiple nevi, Obstructive sleep apnea, Panic disorder (1995), and Polycystic ovarian disease. here with   Migraine with aura and without status migrainosus, not intractable  Mrs. Steinmetz reports that headaches are worsening. She has been off Emgality for 2-3 months. Not sure Emgality was helping. She wishes to try Botox. We will check insurance coverage. She will continue rizatriptan for abortive therapy.  We have discussed multiple factors that could  be contributing  to headaches.  She has not seen psychiatry. I have advised close follow up with PCP for anxiety and BP monitoring. She does not feel that she has sleep apnea. She reports OSA diagnosis in the past but does not feel this is a current concern. We have discussed relationship of sleep apnea to headaches. Healthy lifestyle habits encouraged for weight management.  Regular exercise will help with stress management.  She is aware that she may call me for sleep referral in the future if she changes her mind.  She will follow up with primary care as directed.  She will return to see me in 6 months, sooner if needed.  She verbalizes understanding and agreement with this plan.   Debbora Presto, MSN, FNP-C 07/23/2021, 2:58 PM  [facility] [address], [place], [place] [address] [phone]

## 2021-07-24 ENCOUNTER — Encounter: Payer: Self-pay | Admitting: Family Medicine

## 2021-07-28 ENCOUNTER — Telehealth: Payer: Self-pay | Admitting: Family Medicine

## 2021-07-28 NOTE — Telephone Encounter (Signed)
Good afternoon, Seth Bake. I have received an answer form out Botox coordinator. We have to try and fail a couple of medicaitons before your insurance will consider Botox for migraine prevention. The required medications are EITHER amitriptyline or nortriptyline AND EITHER propranolol or metoprolol. I did not see any of these medications in your history of medications in Epic. Have you ever taken these in the past? If so, any significant issues with them? Amitriptyline and nortriptyline are in a class of medications called tricyclic antidepressants. They work great for migraine management but can cause some sleepiness. I usually have my patients take this medication at bedtime. Propranolol and metoprolol are beta blockers. They also work well to prevent migraines but can lower blood pressure and or pulse. It is usually not by much and at review of your last reading, I would not be worried about these medications causing readings to be too low. I have included info below on both amitriptyline and propranolol. There are other side effects as discussed below but most common are the ones I have mentioned. Please review these medicaitons and let me know if you wish to try one or the other. I would start a low dose of either one then titrate up as tolerated. If one doesn't work, we would then try the other. Let me know what you think!

## 2021-07-30 ENCOUNTER — Other Ambulatory Visit: Payer: Self-pay | Admitting: Neurology

## 2021-07-30 MED ORDER — RIZATRIPTAN BENZOATE 10 MG PO TBDP: ORAL_TABLET | ORAL | 11 refills | Status: DC

## 2021-07-31 MED ORDER — AMITRIPTYLINE HCL 10 MG PO TABS: 10.0000 mg | ORAL_TABLET | Freq: Every day | ORAL | 1 refills | Status: DC

## 2021-07-31 NOTE — Addendum Note (Signed)
Addended by: Debbora Presto L on: 07/31/2021 09:04 AM   Modules accepted: Orders

## 2021-09-17 ENCOUNTER — Other Ambulatory Visit: Payer: Self-pay | Admitting: Neurology

## 2021-09-17 ENCOUNTER — Encounter: Payer: Self-pay | Admitting: Family Medicine

## 2021-09-17 MED ORDER — ONDANSETRON 4 MG PO TBDP: 4.0000 mg | ORAL_TABLET | Freq: Three times a day (TID) | ORAL | 0 refills | Status: DC | PRN

## 2021-09-17 MED ORDER — AMITRIPTYLINE HCL 10 MG PO TABS: 20.0000 mg | ORAL_TABLET | Freq: Every day | ORAL | 1 refills | Status: DC

## 2021-09-17 NOTE — Telephone Encounter (Signed)
Please advise the patient to increase Amitriptyline to 20 mg nightly, I will order her Zofran to take when the migraines are bad.

## 2021-10-19 ENCOUNTER — Other Ambulatory Visit: Payer: Self-pay | Admitting: Neurology

## 2021-10-19 ENCOUNTER — Other Ambulatory Visit (INDEPENDENT_AMBULATORY_CARE_PROVIDER_SITE_OTHER): Payer: Self-pay | Admitting: Internal Medicine

## 2021-10-20 ENCOUNTER — Encounter (HOSPITAL_COMMUNITY): Payer: Self-pay

## 2021-10-20 ENCOUNTER — Other Ambulatory Visit: Payer: Self-pay

## 2021-10-20 ENCOUNTER — Ambulatory Visit (INDEPENDENT_AMBULATORY_CARE_PROVIDER_SITE_OTHER): Payer: 59 | Admitting: Clinical

## 2021-10-20 DIAGNOSIS — F331 Major depressive disorder, recurrent, moderate: Secondary | ICD-10-CM | POA: Diagnosis not present

## 2021-10-20 DIAGNOSIS — F410 Panic disorder [episodic paroxysmal anxiety] without agoraphobia: Secondary | ICD-10-CM | POA: Diagnosis not present

## 2021-10-20 DIAGNOSIS — F419 Anxiety disorder, unspecified: Secondary | ICD-10-CM | POA: Diagnosis not present

## 2021-10-20 MED ORDER — ONDANSETRON 4 MG PO TBDP: 4.0000 mg | ORAL_TABLET | Freq: Three times a day (TID) | ORAL | 0 refills | Status: DC | PRN

## 2021-10-20 NOTE — Plan of Care (Signed)
Verbal Consent 

## 2021-10-20 NOTE — Telephone Encounter (Signed)
Rx refilled.
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
## 2021-10-23 HISTORY — PX: EYE SURGERY: SHX253

## 2021-10-23 NOTE — Progress Notes (Deleted)
° °  PATIENT: Kimberly Ortega DOB: 22-Feb-1966  REASON FOR VISIT: follow up HISTORY FROM: patient  Virtual Visit via Telephone Note  I connected with Kimberly Ortega on 10/23/21 at 10:30 AM EST by telephone and verified that I am speaking with the correct person using two identifiers.   I discussed the limitations, risks, security and privacy concerns of performing an evaluation and management service by telephone and the availability of in person appointments. I also discussed with the patient that there may be a patient responsible charge related to this service. The patient expressed understanding and agreed to proceed.   History of Present Illness:  10/23/21 ALL: Kimberly Ortega is a 56 y.o. female here today for follow up for migraines. Emgality was not effective at last visit. Botox was not covered until she tried and failed amitriptyline/nortriptyline and propranolol/metoprolol. We opted to try low dose amitriptyline. She started 10mg  daily but was not effective and dose was increased to 20mg  daily 09/17/22. Since,   Rizatriptan and ondansetron help with abortive therapy. Excedrin also helps.   She has a history of OSA.   07/23/21 ALL (Office): Kimberly Ortega returns for follow up for migraines. She continued Emgality until about 2 months ago. She reports that she can no longer afford injection due to co pay card not working. She reports that we did not approve PA She did not feel She is having at least 20 headache days a month and all are migrainous. Rizatriptan does seem to help some.    Medications tried and failed: Emgality, topiramate, lamotrigine, rizatriptan    Observations/Objective:  Generalized: Well developed, in no acute distress  Mentation: Alert oriented to time, place, history taking. Follows all commands speech and language fluent   Assessment and Plan:  56 y.o. year old female  has a past medical history of Abdominal pain, Allergies, Anemia, Anxiety, Arthritis, Complication of  anesthesia, Essential hypertension, Family history of breast cancer, GERD (gastroesophageal reflux disease) (03/01/2017), History of melanoma, History of recurrent UTIs, Migraine, Multiple nevi, Obstructive sleep apnea, Panic disorder (1995), and Polycystic ovarian disease. here with  No diagnosis found.  No orders of the defined types were placed in this encounter.   No orders of the defined types were placed in this encounter.    Follow Up Instructions:  I discussed the assessment and treatment plan with the patient. The patient was provided an opportunity to ask questions and all were answered. The patient agreed with the plan and demonstrated an understanding of the instructions.   The patient was advised to call back or seek an in-person evaluation if the symptoms worsen or if the condition fails to improve as anticipated.  I provided *** minutes of non-face-to-face time during this encounter. Patient located at their place of residence during [place] visit. Provider is in the office.    Debbora Presto, NP

## 2021-10-23 NOTE — Patient Instructions (Incomplete)

## 2021-10-24 ENCOUNTER — Telehealth: Payer: Self-pay | Admitting: Family Medicine

## 2021-10-24 DIAGNOSIS — G43109 Migraine with aura, not intractable, without status migrainosus: Secondary | ICD-10-CM

## 2021-10-24 DIAGNOSIS — R110 Nausea: Secondary | ICD-10-CM

## 2021-10-24 DIAGNOSIS — Z8669 Personal history of other diseases of the nervous system and sense organs: Secondary | ICD-10-CM

## 2021-10-24 DIAGNOSIS — F32A Depression, unspecified: Secondary | ICD-10-CM

## 2021-11-10 ENCOUNTER — Other Ambulatory Visit: Payer: Self-pay

## 2021-11-10 ENCOUNTER — Ambulatory Visit (INDEPENDENT_AMBULATORY_CARE_PROVIDER_SITE_OTHER): Payer: 59 | Admitting: Clinical

## 2021-11-10 DIAGNOSIS — F410 Panic disorder [episodic paroxysmal anxiety] without agoraphobia: Secondary | ICD-10-CM | POA: Diagnosis not present

## 2021-11-10 DIAGNOSIS — F419 Anxiety disorder, unspecified: Secondary | ICD-10-CM | POA: Diagnosis not present

## 2021-11-10 DIAGNOSIS — F331 Major depressive disorder, recurrent, moderate: Secondary | ICD-10-CM

## 2021-11-10 NOTE — Progress Notes (Signed)
Virtual Visit via Video Note  I connected with Kimberly Ortega on 11/10/21 at  9:00 AM EST by a video enabled telemedicine application and verified that I am speaking with the correct person using two identifiers.  Location: Patient: Home Provider: Office   I discussed the limitations of evaluation and management by telemedicine and the availability of in person appointments. The patient expressed understanding and agreed to proceed.    THERAPIST PROGRESS NOTE     Session Time: 9:00 AM-9:40 AM   Participation Level: Active   Behavioral Response: Casual and Alert,Depressed   Type of Therapy: Individual Therapy   Treatment Goals addressed: Depression and Anxiety   Interventions: CBT   Summary: Kimberly Ortega is a 56 y.o. female who presents with Depression with Anxiety and Panic Disorder. The OPT therapist worked with the patient for her initial OPT treatment. The OPT therapist utilized Motivational Interviewing to assist in creating therapeutic repore. The patient in the session was engaged and work in collaboration giving feedback about her triggers and symptoms over the past few weeks. The patient spoke about the impact of having low blood pressure and this creating a panic attack for the patient which led to difficulty with her sleep cycle as the patient was scared to go to sleep. The OPT therapist utilized Cognitive Behavioral Therapy through cognitive restructuring as well as worked with the patient on coping strategies to assist in management of mood. The OPT therapist overviewed in session with the patient basic need areas examining the patients current eating habits, sleep schedule, exercise, and hygiene. The patient reviewed sections of her current life including interactions with her partner.   Suicidal/Homicidal: Nowithout intent/plan   Therapist Response:The OPT therapist worked with the patient for the patients scheduled session. The patient was engaged in her session and gave  feedback in relation to triggers, symptoms, and behavior responses over the past few weeks. The OPT therapist worked with the patient utilizing an in session Cognitive Behavioral Therapy exercise. The patient was responsive in the session and verbalized, " I checked my blood pressure and my number was low and I started to have a panic attack and that caused me to not be able to sleep". The patient spoke about intent to follow up with her PCP to get more information and to learn what's causing her physical health symptom. The OPT therapist worked with the patient providing ongoing psycho-education. The OPT therapist will continue treatment work with the patient in her next scheduled session   Plan: Return again in 2/3 weeks.   Diagnosis:      Axis I:   Recurrent Moderate Major Depressive Disorder with Anxiety. Panic Disorder   Axis II: No diagnosis       Collaboration of Care: Collaboration with patient PCP in indicating the patient needs to have an evaluation due to low blood pressure episode.   Patient/Guardian was advised Release of Information must be obtained prior to any record release in order to collaborate their care with an outside provider. Patient/Guardian was advised if they have not already done so to contact the registration department to sign all necessary forms in order for [name] to release information regarding their care.    Consent: Patient/Guardian gives verbal consent for treatment and assignment of benefits for services provided during this visit. Patient/Guardian expressed understanding and agreed to proceed.    I discussed the assessment and treatment plan with the patient. The patient was provided an opportunity to ask questions and all were answered. The  patient agreed with the plan and demonstrated an understanding of the instructions.   The patient was advised to call back or seek an in-person evaluation if the symptoms worsen or if the condition fails to improve as  anticipated.   I provided 40 minutes of non-face-to-face time during this encounter.   Lennox Grumbles, LCSW   11/10/2021

## 2021-11-26 ENCOUNTER — Telehealth (HOSPITAL_COMMUNITY): Payer: Self-pay | Admitting: Clinical

## 2021-11-26 NOTE — Telephone Encounter (Signed)
Returned call to pt no answer unable to leave vm due to voicemail box being full ?

## 2021-11-27 ENCOUNTER — Ambulatory Visit (HOSPITAL_COMMUNITY): Payer: Self-pay | Admitting: Clinical

## 2021-11-30 ENCOUNTER — Other Ambulatory Visit: Payer: Self-pay | Admitting: Diagnostic Neuroimaging

## 2021-12-03 ENCOUNTER — Other Ambulatory Visit: Payer: Self-pay | Admitting: Neurology

## 2021-12-03 NOTE — Telephone Encounter (Signed)
Rx refilled.

## 2021-12-15 ENCOUNTER — Ambulatory Visit (INDEPENDENT_AMBULATORY_CARE_PROVIDER_SITE_OTHER): Payer: 59 | Admitting: Clinical

## 2021-12-15 ENCOUNTER — Encounter (HOSPITAL_COMMUNITY): Payer: Self-pay

## 2021-12-15 ENCOUNTER — Other Ambulatory Visit: Payer: Self-pay

## 2021-12-15 DIAGNOSIS — F331 Major depressive disorder, recurrent, moderate: Secondary | ICD-10-CM

## 2021-12-15 DIAGNOSIS — F419 Anxiety disorder, unspecified: Secondary | ICD-10-CM | POA: Diagnosis not present

## 2021-12-15 DIAGNOSIS — F410 Panic disorder [episodic paroxysmal anxiety] without agoraphobia: Secondary | ICD-10-CM | POA: Diagnosis not present

## 2021-12-15 NOTE — Progress Notes (Signed)
Virtual Visit via Video Note ?  ?I connected with Kimberly Ortega on 12/15/21 at  2:00 PM EST by a video enabled telemedicine application and verified that I am speaking with the correct person using two identifiers. ?  ?Location: ?Patient: Home ?Provider: Office ?  ?I discussed the limitations of evaluation and management by telemedicine and the availability of in person appointments. The patient expressed understanding and agreed to proceed. ?  ?  ?  ?THERAPIST PROGRESS NOTE ?  ?  ?Session Time: 2:00 PM-2:35 PM ?  ?Participation Level: Active ?  ?Behavioral Response: Casual and Alert,Depressed ?  ?Type of Therapy: Individual Therapy ?  ?Treatment Goals addressed: Depression and Anxiety ?  ?Interventions: CBT ?  ?Summary: Kimberly Ortega is a 56 y.o. female who presents with Depression with Anxiety and Panic Disorder. The OPT therapist worked with the patient for her initial OPT treatment. The OPT therapist utilized Motivational Interviewing to assist in creating therapeutic repore. The patient in the session was engaged and work in collaboration giving feedback about her triggers and symptoms over the past few weeks. The patient spoke about difficulty with her sleep cycle. The patient spoke about her resistance to taking a prescription grade sleep aid. The OPT therapist utilized Cognitive Behavioral Therapy through cognitive restructuring as well as worked with the patient on coping strategies to assist in management of mood. The OPT therapist overviewed in session with the patient basic need areas examining the patients current eating habits, sleep schedule, exercise, and hygiene.  ?  ?Suicidal/Homicidal: Nowithout intent/plan ?  ?Therapist Response:The OPT therapist worked with the patient for the patients scheduled session. The patient was engaged in her session and gave feedback in relation to triggers, symptoms, and behavior responses over the past few weeks. The OPT therapist worked with the patient utilizing an  in session Cognitive Behavioral Therapy exercise. The patient was responsive in the session and verbalized, " My mind never stops and I try to get sleep but my brain wont shut off". The patient spoke about her difficulty with anxiety. The patient verbalized her willingness to request a prescription grade sleep aid after reviewing the areas that are being effected in her life with the OPT therapist.The OPT therapist worked with the patient providing ongoing psycho-education. The OPT therapist will continue treatment work with the patient in her next scheduled session ?  ?Plan: Return again in 2/3 weeks. ?  ?Diagnosis:      Axis I: Recurrent Moderate Major Depressive Disorder with Anxiety. Panic Disorder ?  ? Axis II: No diagnosis ?  ?  ?  ?Collaboration of Care: No additional collaboration of care for this session. ?  ?Patient/Guardian was advised Release of Information must be obtained prior to any record release in order to collaborate their care with an outside provider. Patient/Guardian was advised if they have not already done so to contact the registration department to sign all necessary forms in order for [name] to release information regarding their care.  ?  ?Consent: Patient/Guardian gives verbal consent for treatment and assignment of benefits for services provided during this visit. Patient/Guardian expressed understanding and agreed to proceed.  ?  ?I discussed the assessment and treatment plan with the patient. The patient was provided an opportunity to ask questions and all were answered. The patient agreed with the plan and demonstrated an understanding of the instructions. ?  ?The patient was advised to call back or seek an in-person evaluation if the symptoms worsen or if the condition fails to  improve as anticipated. ?  ?I provided 35 minutes of non-face-to-face time during this encounter. ?  ?Lennox Grumbles, LCSW ?  ?12/15/2021 ?

## 2021-12-27 ENCOUNTER — Other Ambulatory Visit (INDEPENDENT_AMBULATORY_CARE_PROVIDER_SITE_OTHER): Payer: Self-pay | Admitting: Internal Medicine

## 2021-12-27 DIAGNOSIS — K219 Gastro-esophageal reflux disease without esophagitis: Secondary | ICD-10-CM

## 2021-12-30 ENCOUNTER — Other Ambulatory Visit: Payer: Self-pay

## 2021-12-30 ENCOUNTER — Telehealth: Payer: Self-pay

## 2021-12-30 MED ORDER — ONDANSETRON 4 MG PO TBDP: ORAL_TABLET | ORAL | 0 refills | Status: DC

## 2021-12-30 MED ORDER — AMITRIPTYLINE HCL 10 MG PO TABS: 20.0000 mg | ORAL_TABLET | Freq: Every day | ORAL | 0 refills | Status: DC

## 2021-12-30 NOTE — Telephone Encounter (Signed)
Received fax from [facility] [address], [place], [place] [id number]. Fax states "Pt is requesting suppositories, assumedly promethazine suppositories in place of this medication (Ondansetron ODT '4MG'$ ). Please send a new prescription if appropriate. Thank you!" ?

## 2021-12-30 NOTE — Addendum Note (Signed)
Addended by: Wyvonnia Lora on: 12/30/2021 05:19 PM ? ? Modules accepted: Orders ? ?

## 2021-12-30 NOTE — Telephone Encounter (Signed)
Attempted to call pt. VM is not set up to receive messages. Please route future calls to POD 1. ?

## 2021-12-30 NOTE — Telephone Encounter (Signed)
Called pt and relayed AL,NP message. She states she sometimes throws up from migraines. Worried she throws ondansetron back up. Advised she is taking ODT. Advised this gets absorbed pretty quickly d/t being ODT. She was agreeable to continue this. Refill sent. ? ?I offered to make f/u. She is due around 01/20/22 for appt. She declined, has sciatic issues going on. Cannot get up to look at calender to make appt. Will call back later to make. ?

## 2021-12-30 NOTE — Telephone Encounter (Signed)
Pt returned the call. Would like a call back.  ?

## 2022-01-21 ENCOUNTER — Ambulatory Visit (INDEPENDENT_AMBULATORY_CARE_PROVIDER_SITE_OTHER): Payer: 59 | Admitting: Clinical

## 2022-01-21 DIAGNOSIS — F331 Major depressive disorder, recurrent, moderate: Secondary | ICD-10-CM

## 2022-01-21 DIAGNOSIS — F410 Panic disorder [episodic paroxysmal anxiety] without agoraphobia: Secondary | ICD-10-CM

## 2022-01-21 DIAGNOSIS — F419 Anxiety disorder, unspecified: Secondary | ICD-10-CM

## 2022-01-21 NOTE — Progress Notes (Signed)
Virtual Visit via Video Note ?  ?I connected with Kimberly Ortega on 01/21/22 at  1:00 PM EST by a video enabled telemedicine application and verified that I am speaking with the correct person using two identifiers. ?  ?Location: ?Patient: Home ?Provider: Office ?  ?I discussed the limitations of evaluation and management by telemedicine and the availability of in person appointments. The patient expressed understanding and agreed to proceed. ?  ?  ?  ?THERAPIST PROGRESS NOTE ?  ?  ?Session Time: 1:00 PM-1:45 PM ?  ?Participation Level: Active ?  ?Behavioral Response: Casual and Alert,Depressed ?  ?Type of Therapy: Individual Therapy ?  ?Treatment Goals addressed: Depression and Anxiety ?  ?Interventions: CBT ?  ?Summary: Kimberly Ortega is a 56 y.o. female who presents with Depression with Anxiety and Panic Disorder. The OPT therapist worked with the patient for her ongoing OPT treatment. The OPT therapist utilized Motivational Interviewing to assist in creating therapeutic repore. The patient in the session was engaged and work in collaboration giving feedback about her triggers and symptoms over the past few weeks. The patient spoke about difficulty with her sleep cycle and sciatica. The patient noted that her insurance has denied the MRI needed for the sciatica , but has approved coverage for her sleep aid (Belsomnia) The patient spoke about her resistance to taking a prescription grade sleep aid. The OPT therapist utilized Cognitive Behavioral Therapy through cognitive restructuring as well as worked with the patient on coping strategies to assist in management of mood. The OPT therapist overviewed in session with the patient basic need areas examining the patients current eating habits, sleep schedule, exercise, and hygiene. The patient will be starting her sleep aid and will give feedback in her next session about this helping to regulate her sleep cycle. ?  ?Suicidal/Homicidal: Nowithout intent/plan ?  ?Therapist  Response:The OPT therapist worked with the patient for the patients scheduled session. The patient was engaged in her session and gave feedback in relation to triggers, symptoms, and behavior responses over the past few weeks. The OPT therapist worked with the patient utilizing an in session Cognitive Behavioral Therapy exercise. The patient was responsive in the session and verbalized, " I know I have to treat the insomnia and its having a impact on my functioning I am only getting now 3hrs of sleep at night my concern is that the sleep aid would maybe interfere with my Alprazolam". The patient spoke about her support system not understanding what shes going through, but she did provide the support system with psycho-education provided by the OPT therapist.The patient spoke about her worry around others seeing her having a panic attack but also acknowledged she has done all in her power to educate her support system and realizes that she cannot control others reactions to her MH episodes. The patient will continue to work on her functioning through adding sleep aid and continuing to  The OPT therapist will continue treatment work with the patient in her next scheduled session ?  ?Plan: Return again in 2/3 weeks. ?  ?Diagnosis:      Axis I: Recurrent Moderate Major Depressive Disorder with Anxiety. Panic Disorder ?  ? Axis II: No diagnosis ?  ?  ?  ?Collaboration of Care: No additional collaboration of care for this session. ?  ?Patient/Guardian was advised Release of Information must be obtained prior to any record release in order to collaborate their care with an outside provider. Patient/Guardian was advised if they have not already  done so to contact the registration department to sign all necessary forms in order for [name] to release information regarding their care.  ?  ?Consent: Patient/Guardian gives verbal consent for treatment and assignment of benefits for services provided during this visit.  Patient/Guardian expressed understanding and agreed to proceed.  ?  ?I discussed the assessment and treatment plan with the patient. The patient was provided an opportunity to ask questions and all were answered. The patient agreed with the plan and demonstrated an understanding of the instructions. ?  ?The patient was advised to call back or seek an in-person evaluation if the symptoms worsen or if the condition fails to improve as anticipated. ?  ?I provided 45 minutes of non-face-to-face time during this encounter. ?  ?Lennox Grumbles, LCSW ?  ?01/21/2022 ?

## 2022-02-18 ENCOUNTER — Ambulatory Visit (INDEPENDENT_AMBULATORY_CARE_PROVIDER_SITE_OTHER): Payer: 59 | Admitting: Clinical

## 2022-02-18 DIAGNOSIS — F410 Panic disorder [episodic paroxysmal anxiety] without agoraphobia: Secondary | ICD-10-CM | POA: Diagnosis not present

## 2022-02-18 DIAGNOSIS — F419 Anxiety disorder, unspecified: Secondary | ICD-10-CM | POA: Diagnosis not present

## 2022-02-18 DIAGNOSIS — F331 Major depressive disorder, recurrent, moderate: Secondary | ICD-10-CM | POA: Diagnosis not present

## 2022-02-18 NOTE — Plan of Care (Signed)
Verbal consent  

## 2022-02-18 NOTE — Progress Notes (Signed)
Virtual Visit via Video Note   I connected with Kimberly Ortega on 02/18/22 at  1:00 PM EST by a video enabled telemedicine application and verified that I am speaking with the correct person using two identifiers.   Location: Patient: Home Provider: Office   I discussed the limitations of evaluation and management by telemedicine and the availability of in person appointments. The patient expressed understanding and agreed to proceed.       THERAPIST PROGRESS NOTE     Session Time: 1:00 PM-1:45 PM   Participation Level: Active   Behavioral Response: Casual and Alert,Depressed   Type of Therapy: Individual Therapy   Treatment Goals addressed: Depression and Anxiety   Interventions: CBT   Summary: Kimberly Ortega is a 56 y.o. female who presents with Depression with Anxiety and Panic Disorder. The OPT therapist worked with the patient for her ongoing OPT treatment. The OPT therapist utilized Motivational Interviewing to assist in creating therapeutic repore. The patient in the session was engaged and work in collaboration giving feedback about her triggers and symptoms over the past few weeks. The patient spoke about MRI results coming in a follow up next week where she will get the results, the patient did indicate that something was found and she will be going in to get more information.The patient noted due to having more migrans through May she did not attempt to take her prescription grade sleep aid due to the potential of this medication being headaches/migraines, therefore the patient has not been able to gain progress . The OPT therapist utilized Cognitive Behavioral Therapy through cognitive restructuring as well as worked with the patient on coping strategies to assist in management of mood. The OPT therapist overviewed in session with the patient basic need areas examining the patients current eating habits, sleep schedule, exercise, and hygiene.    Suicidal/Homicidal: Nowithout  intent/plan   Therapist Response:The OPT therapist worked with the patient for the patients scheduled session. The patient was engaged in her session and gave feedback in relation to triggers, symptoms, and behavior responses over the past few weeks. The OPT therapist worked with the patient utilizing an in session Cognitive Behavioral Therapy exercise. The patient was responsive in the session and verbalized, " I know they did find something with the MRI and they are scheduling me in to review the results next week". The patient spoke about the difficulty of not catastrophizing what the results are and being worried. The Opt therapist worked with the patient to try to fight Automatic Negative Thoughts (ANTs). The OPT therapist will continue treatment work with the patient in her next scheduled session   Plan: Return again in 2/3 weeks.   Diagnosis:      Axis I: Recurrent Moderate Major Depressive Disorder with Anxiety. Panic Disorder    Axis II: No diagnosis       Collaboration of Care: No additional collaboration of care for this session.   Patient/Guardian was advised Release of Information must be obtained prior to any record release in order to collaborate their care with an outside provider. Patient/Guardian was advised if they have not already done so to contact the registration department to sign all necessary forms in order for [name] to release information regarding their care.    Consent: Patient/Guardian gives verbal consent for treatment and assignment of benefits for services provided during this visit. Patient/Guardian expressed understanding and agreed to proceed.    I discussed the assessment and treatment plan with the patient. The patient was  provided an opportunity to ask questions and all were answered. The patient agreed with the plan and demonstrated an understanding of the instructions.   The patient was advised to call back or seek an in-person evaluation if the symptoms  worsen or if the condition fails to improve as anticipated.   I provided 45 minutes of non-face-to-face time during this encounter.   Lennox Grumbles, LCSW   02/18/2022

## 2022-03-12 ENCOUNTER — Ambulatory Visit (HOSPITAL_COMMUNITY): Payer: Self-pay | Admitting: Clinical

## 2022-03-19 ENCOUNTER — Encounter: Payer: Self-pay | Admitting: Family Medicine

## 2022-03-19 ENCOUNTER — Ambulatory Visit (INDEPENDENT_AMBULATORY_CARE_PROVIDER_SITE_OTHER): Payer: 59 | Admitting: Family Medicine

## 2022-03-19 VITALS — BP 129/84 | HR 64 | Ht 67.0 in | Wt 205.5 lb

## 2022-03-19 DIAGNOSIS — G43109 Migraine with aura, not intractable, without status migrainosus: Secondary | ICD-10-CM | POA: Diagnosis not present

## 2022-03-19 MED ORDER — ONDANSETRON 4 MG PO TBDP: ORAL_TABLET | ORAL | 0 refills | Status: DC

## 2022-03-19 MED ORDER — PROPRANOLOL HCL 20 MG PO TABS: 20.0000 mg | ORAL_TABLET | Freq: Two times a day (BID) | ORAL | 1 refills | Status: DC

## 2022-03-19 MED ORDER — AMITRIPTYLINE HCL 10 MG PO TABS: 20.0000 mg | ORAL_TABLET | Freq: Every day | ORAL | 0 refills | Status: DC

## 2022-03-19 NOTE — Progress Notes (Signed)
Chief Complaint  Patient presents with   Follow-up    Rm 2, alone. Here to discuss medication management. Would like to try Botox.     HISTORY OF PRESENT ILLNESS:  03/19/22 ALL: Kimberly Ortega returns for follow up for migraines. She was last seen 07/2021 and wished to start Botox, however, insurance required she try amitriptyline or nortriptyline and propranolol or metoprolol. She was hesitant to start any medication that could effect BP and agreed to try amitriptyline. We started '10mg'$  QHS and increased to '20mg'$  QHS in 08/2021. Since, she reports headaches continue. She is having 15-20 headaches every month. At least 15 are migrainous. She is tolerating amitriptyline '20mg'$ . Rizatriptan helps   Medications tried and failed: Emgality, amitriptyline, topiramate, lamotrigine, rizatriptan   07/23/2021 ALL: Kimberly Ortega returns for follow up for migraines. She continued Emgality until about 2 months ago. She reports that she can no longer afford injection due to co pay card not working. She reports that we did not approve PA She did not feel She is having at least 20 headache days a month and all are migrainous. Rizatriptan does seem to help some. Ondansetron helps with nausea. She started Viibryd about 3 months ago. She is not sure it is helping.   Medications tried and failed: Emgality, topiramate, lamotrigine, rizatriptan   07/22/2020 ALL:  Kimberly Ortega is a 56 y.o. female here today for follow up for migraines. MRI normal. She has failed topiramate in the past so she was started on Emgality. She has about 4-5 headache days a month. She feels rizatripan works fairly well. She may have to repeat a dose but usually she can ease migraine. She knows that stress is main trigger. She has gained weight (about 60 pounds). She plans to see psychiatry in the near future. She does worry about possible sleep apnea but wishes to manage mood and weight prior to having sleep study. She is worried about cost of sleep study.     HISTORY (copied from Dr Gladstone Lighter note on 03/20/2020)  56 year old female here for evaluation of headaches.  History of anxiety and panic attacks.   Patient has history of migraine since age 61 years old with throbbing headaches, seeing squiggly lines, nausea and photophobia.  Patient manages conservatively with over-the-counter medication and rest.   Over past 8 months patient had increasing frequency and severity of headaches now having 2-3 severe headaches per week.  Also with daily dull nagging headaches.  Sometimes has nausea.  Headaches can be right or left-sided, sometimes around her eyes.  Triggers include stress and lack of sleep.  Patient taking Tylenol almost on daily basis.  Sometimes uses ice packs.    REVIEW OF SYSTEMS: Out of a complete 14 system review of symptoms, the patient complains only of the following symptoms, headaches, morning headaches, anxiety, panic attacks, snoring, daytime sleepiness, weight gain and all other reviewed systems are negative.   ALLERGIES: Allergies  Allergen Reactions   Ibuprofen Shortness Of Breath and Other (See Comments)    Lung specialist told her not to take    Macrobid [Nitrofurantoin Macrocrystal] Other (See Comments)    Lung specialist Told it would kill her  SOB   Morphine And Related Nausea Only    dizziness   Topiramate Other (See Comments)    Cognitive side effects.   Tramadol     Low blood pressure   Other Palpitations    PSEUDO COUGH DECONGESTANT     HOME MEDICATIONS: Outpatient Medications Prior to Visit  Medication Sig Dispense Refill   acetaminophen (TYLENOL) 500 MG tablet Take 1,000 mg by mouth every 6 (six) hours as needed.     ALPRAZolam (XANAX) 1 MG tablet Take 1 mg by mouth 3 (three) times daily as needed for anxiety.      BELSOMRA 5 MG TABS Take 1 tablet by mouth at bedtime.     Biotin w/ Vitamins C & E (HAIR/SKIN/NAILS PO) Take 3 each by mouth daily. Chewable     Calcium Carbonate-Vitamin D (CALCIUM  PLUS VITAMIN D PO) Take 2 each by mouth daily. Chewables     dexlansoprazole (DEXILANT) 60 MG capsule TAKE 1 CAPSULE(60 MG) BY MOUTH DAILY 90 capsule 3   famotidine (PEPCID) 20 MG tablet Take 1 tablet (20 mg total) by mouth at bedtime as needed for heartburn or indigestion.     lamoTRIgine (LAMICTAL) 100 MG tablet Take 100 mg by mouth 2 (two) times daily.     metFORMIN (GLUCOPHAGE) 1000 MG tablet Take 1,000 mg by mouth daily.     MONUROL 3 g PACK Take 3 g by mouth daily as needed. Urinary tract infection  0   MULTIPLE VITAMIN PO Take 1 tablet by mouth daily.     Multiple Vitamins-Minerals (MULTIVITAMIN GUMMIES ADULT PO) Take 2 each by mouth daily.     OZEMPIC, 0.25 OR 0.5 MG/DOSE, 2 MG/3ML SOPN Inject 0.5 mg into the skin once a week.     Probiotic Product (ALIGN) CHEW Chew 1 capsule by mouth daily.     rizatriptan (MAXALT-MLT) 10 MG disintegrating tablet TAKE 1 TABLET BY MOUTH AS NEEDED FOR MIGRAINE. MAY REPEAT IN 2 HOURS AS NEEDED 12 tablet 11   Vilazodone HCl (VIIBRYD) 10 MG TABS Take 10 mg by mouth daily.     amitriptyline (ELAVIL) 10 MG tablet Take 2 tablets (20 mg total) by mouth at bedtime. Please call and make overdue appt for further refills. 1st attempt 180 tablet 0   ondansetron (ZOFRAN-ODT) 4 MG disintegrating tablet DISSOLVE 1 TABLET(4 MG) ON THE TONGUE EVERY 8 HOURS AS NEEDED FOR NAUSEA OR VOMITING 20 tablet 0   Galcanezumab-gnlm (EMGALITY) 120 MG/ML SOAJ Inject 120 mg into the skin every 30 (thirty) days. Start '240mg'$  injection loading dose x 1; after 1 month start '120mg'$  monthly injections 3 pen 4   lamoTRIgine (LAMICTAL) 100 MG tablet Take 100 mg by mouth 2 (two) times daily.      metFORMIN (GLUCOPHAGE) 500 MG tablet Take 1,000 mg by mouth daily.      polyethylene glycol powder (GLYCOLAX/MIRALAX) 17 GM/SCOOP powder TAKE 17 GRAMS BY MOUTH DAILY AS NEEDED 238 g 11   No facility-administered medications prior to visit.     PAST MEDICAL HISTORY: Past Medical History:  Diagnosis  Date   Abdominal pain    Allergies    Anemia    Anxiety    Arthritis    Complication of anesthesia    dizziness; almost passed out   Essential hypertension    Family history of breast cancer    Cousins on both sides of family   GERD (gastroesophageal reflux disease) 03/01/2017   History of melanoma    History of recurrent UTIs    Migraine    Multiple nevi    Obstructive sleep apnea    Panic disorder 1995   Polycystic ovarian disease      PAST SURGICAL HISTORY: Past Surgical History:  Procedure Laterality Date   BIOPSY  04/09/2017   Procedure: BIOPSY;  Surgeon: Rogene Houston, MD;  Location: [facility];  Service: Endoscopy;;  gastric esophageal    CHOLECYSTECTOMY  1991   COLONOSCOPY  2016   DILATION AND CURETTAGE OF UTERUS     ESOPHAGOGASTRODUODENOSCOPY N/A 04/09/2017   Procedure: ESOPHAGOGASTRODUODENOSCOPY (EGD);  Surgeon: Rogene Houston, MD;  Location: [facility];  Service: Endoscopy;  Laterality: N/A;  9:25   LAPAROSCOPIC GASTRIC BANDING  07/07/11   LAPAROSCOPIC REPAIR AND REMOVAL OF GASTRIC BAND     UPPER GI ENDOSCOPY N/A 11/21/2012   Procedure: UPPER GI ENDOSCOPY Ileene Hutchinson DIAGNOSTIC LAPAROSCOPY /REMOVAL LAP GASTRIC BAND/INSERTION OF DRAIN;  Surgeon: Pedro Earls, MD;  Location: [facility];  Service: General;  Laterality: N/A;     FAMILY HISTORY: Family History  Problem Relation Age of Onset   Cervical cancer Mother    Stroke Mother    Aneurysm Mother    Cervical cancer Sister    Breast cancer Cousin        Both sides of family     SOCIAL HISTORY: Social History   Socioeconomic History   Marital status: Married    Spouse name: Not on file   Number of children: 1   Years of education: Not on file   Highest education level: Some college, no degree  Occupational History    Comment: na  Tobacco Use   Smoking status: Never   Smokeless tobacco: Never  Substance and Sexual Activity   Alcohol use: Yes    Comment: wine/liquor maybe once a month    Drug use: No   Sexual activity: Yes    Birth control/protection: I.U.D.  Other Topics Concern   Not on file  Social History Narrative   Lives with spouse   Caffeine- none   Social Determinants of Health   Financial Resource Strain: Not on file  Food Insecurity: Not on file  Transportation Needs: Not on file  Physical Activity: Not on file  Stress: Not on file  Social Connections: Not on file  Intimate Partner Violence: Not on file      PHYSICAL EXAM  Vitals:   03/19/22 1440  BP: 129/84  Pulse: 64  Weight: 205 lb 8 oz (93.2 kg)  Height: '5\' 7"'$  (1.702 m)    Body mass index is 32.19 kg/m.   Generalized: Well developed, in no acute distress  Respiratory: Clear to auscultation bilaterally Cardiology: Normal rate and rhythm, no murmur auscultated Neurological examination  Mentation: Alert oriented to time, place, history taking. Follows all commands speech and language fluent Cranial nerve II-XII: Pupils were equal round reactive to light. Extraocular movements were full, visual field were full  Motor: The motor testing reveals 5 over 5 strength of all 4 extremities. Good symmetric motor tone is noted throughout.  Gait and station: Gait is normal.     DIAGNOSTIC DATA (LABS, IMAGING, TESTING) - I reviewed patient records, labs, notes, testing and imaging myself where available.  Lab Results  Component Value Date   WBC 8.8 07/16/2020   HGB 13.6 07/16/2020   HCT 40.8 07/16/2020   MCV 87.7 07/16/2020   PLT 299 07/16/2020      Component Value Date/Time   NA 140 07/16/2020 1339   K 4.5 07/16/2020 1339   CL 104 07/16/2020 1339   CO2 30 07/16/2020 1339   GLUCOSE 79 07/16/2020 1339   BUN 12 07/16/2020 1339   CREATININE 0.86 07/16/2020 1339   CALCIUM 9.5 07/16/2020 1339   PROT 7.0 07/16/2020 1339   ALBUMIN 4.1 05/12/2017 0936   AST 15 07/16/2020  1339   ALT 14 07/16/2020 1339   ALKPHOS 65 05/12/2017 0936   BILITOT 0.4 07/16/2020 1339   GFRNONAA 70 10/04/2017  1328   GFRAA 81 10/04/2017 1328   No results found for: "CHOL", "HDL", "LDLCALC", "LDLDIRECT", "TRIG", "CHOLHDL" Lab Results  Component Value Date   HGBA1C 5.0 10/04/2017   No results found for: "VITAMINB12" Lab Results  Component Value Date   TSH 3.68 07/16/2020      ASSESSMENT AND PLAN  56 y.o. year old female  has a past medical history of Abdominal pain, Allergies, Anemia, Anxiety, Arthritis, Complication of anesthesia, Essential hypertension, Family history of breast cancer, GERD (gastroesophageal reflux disease) (03/01/2017), History of melanoma, History of recurrent UTIs, Migraine, Multiple nevi, Obstructive sleep apnea, Panic disorder (1995), and Polycystic ovarian disease. here with   Migraine with aura and without status migrainosus, not intractable  Mrs. Chavous reports that headaches are not improving. She is tolerating amitriptyline '20mg'$  QHS and will continue for now. We will add propranolol '20mg'$  BID. Advised to start '20mg'$  daily then increase to BID dosing. Possible side effects reviewed. She will continue rizatriptan and ondansetron for abortive therapy. Healthy lifestyle habits encouraged for weight management.  Regular exercise will help with stress management. She will follow up with primary care as directed.  She will return to see me in 6 months, sooner if needed.  She verbalizes understanding and agreement with this plan.  Meds ordered this encounter  Medications   propranolol (INDERAL) 20 MG tablet    Sig: Take 1 tablet (20 mg total) by mouth 2 (two) times daily.    Dispense:  180 tablet    Refill:  1    Order Specific Question:   Supervising Provider    Answer:   Melvenia Beam [[id number]]   amitriptyline (ELAVIL) 10 MG tablet    Sig: Take 2 tablets (20 mg total) by mouth at bedtime.    Dispense:  180 tablet    Refill:  0    Order Specific Question:   Supervising Provider    Answer:   Melvenia Beam [[id number]]   ondansetron (ZOFRAN-ODT) 4 MG disintegrating  tablet    Sig: DISSOLVE 1 TABLET(4 MG) ON THE TONGUE EVERY 8 HOURS AS NEEDED FOR NAUSEA OR VOMITING    Dispense:  20 tablet    Refill:  0    Order Specific Question:   Supervising Provider    Answer:   Melvenia Beam [[id number]]     FFM BWGYK, MSN, FNP-C 03/19/2022, 3:53 PM  [facility] [address], [place], [place] [address] [phone]

## 2022-03-19 NOTE — Patient Instructions (Signed)
Below is our plan:  We will continue amitriptyline '20mg'$  every night. We will add propranolol '20mg'$  twice daily. Start '20mg'$  daily at bedtime for 1 weeks. Then increase dose to '20mg'$  twice daily. We can increase to '20mg'$  three times daily if doing well in 4-6 weeks. Continue rizatriptan and ondansetron as needed for abortive therapy.   Please make sure you are staying well hydrated. I recommend 50-60 ounces daily. Well balanced diet and regular exercise encouraged. Consistent sleep schedule with 6-8 hours recommended.   Please continue follow up with care team as directed.   Follow up with me in 6 months   You may receive a survey regarding today's visit. I encourage you to leave honest feed back as I do use this information to improve patient care. Thank you for seeing me today!

## 2022-03-20 ENCOUNTER — Ambulatory Visit (HOSPITAL_COMMUNITY): Payer: Self-pay | Admitting: Clinical

## 2022-03-28 ENCOUNTER — Other Ambulatory Visit (INDEPENDENT_AMBULATORY_CARE_PROVIDER_SITE_OTHER): Payer: Self-pay | Admitting: Internal Medicine

## 2022-03-28 DIAGNOSIS — K219 Gastro-esophageal reflux disease without esophagitis: Secondary | ICD-10-CM

## 2022-04-10 ENCOUNTER — Telehealth (INDEPENDENT_AMBULATORY_CARE_PROVIDER_SITE_OTHER): Payer: Self-pay | Admitting: Clinical

## 2022-04-10 DIAGNOSIS — F410 Panic disorder [episodic paroxysmal anxiety] without agoraphobia: Secondary | ICD-10-CM

## 2022-04-10 DIAGNOSIS — F419 Anxiety disorder, unspecified: Secondary | ICD-10-CM

## 2022-04-10 DIAGNOSIS — F331 Major depressive disorder, recurrent, moderate: Secondary | ICD-10-CM

## 2022-04-10 NOTE — Progress Notes (Signed)
Virtual Visit via Video Note   I connected with Kimberly Ortega on 04/10/22 at  9:00 AM EST by a video enabled telemedicine application and verified that I am speaking with the correct person using two identifiers.   Location: Patient: Home Provider: Office   I discussed the limitations of evaluation and management by telemedicine and the availability of in person appointments. The patient expressed understanding and agreed to proceed.       THERAPIST PROGRESS NOTE     Session Time: 9:00 AM-9:55 AM   Participation Level: Active   Behavioral Response: Casual and Alert,Depressed   Type of Therapy: Individual Therapy   Treatment Goals addressed: Depression and Anxiety   Interventions: CBT   Summary: Kimberly Ortega is a 56 y.o. female who presents with Depression with Anxiety and Panic Disorder. The OPT therapist worked with the patient for her ongoing OPT treatment. The OPT therapist utilized Motivational Interviewing to assist in creating therapeutic repore. The patient in the session was engaged and work in collaboration giving feedback about her triggers and symptoms over the past few weeks. The patient spoke about MRI results and finding that she has a bulging disc and the recommended treatment is injections to relieve the pain..The patient spoke about talking with her doctor about the need for sleep and regulating her sleep cycle Belsomra and the patients insurance did cover this medication. The patient continues at this point to take Hardy but indicates it is not currently working after the past few weeks of taking. The OPT therapist utilized Cognitive Behavioral Therapy through cognitive restructuring as well as worked with the patient on coping strategies to assist in management of mood. The OPT therapist overviewed in session with the patient basic need areas examining the patients current eating habits, sleep schedule, exercise, and hygiene. The patient was told to take increase her  medication to 2 pills at a time and she has been doing this the past 2 nights but so far no noted benefit, however, will continue to try this for the next few days.   Suicidal/Homicidal: Nowithout intent/plan   Therapist Response:The OPT therapist worked with the patient for the patients scheduled session. The patient was engaged in her session and gave feedback in relation to triggers, symptoms, and behavior responses over the past few weeks. The OPT therapist worked with the patient utilizing an in session Cognitive Behavioral Therapy exercise. The patient was responsive in the session and verbalized, " I am thankful and grateful for any sleep I can get the most I have got so far is  3/4 hours a night, but I am thankful for that, but it hasn't been enough for me to tell a difference". The patient spoke about feeling with her sleep aid that she notices more anxiety in the next day. The patient additionally spoke about having a surgical procedure to remove a cancerous growth from back and is still under limitation so she does not tear stiches, Additionally she has scheduled for a different procedure to remove the place from her arm and has this procedure coming up August 22nd. The patient has a consultation for the shots for her back in August as well. The patient has additionally seen her neurologist for her migrans and they added a blood pressure medication Propanalol.The OPT therapist will continue treatment work with the patient in her next scheduled session   Plan: Return again in 2/3 weeks.   Diagnosis:      Axis I: Recurrent Moderate Major Depressive Disorder with  Anxiety. Panic Disorder    Axis II: No diagnosis       Collaboration of Care: No additional collaboration of care for this session.   Patient/Guardian was advised Release of Information must be obtained prior to any record release in order to collaborate their care with an outside provider. Patient/Guardian was advised if they have  not already done so to contact the registration department to sign all necessary forms in order for [name] to release information regarding their care.    Consent: Patient/Guardian gives verbal consent for treatment and assignment of benefits for services provided during this visit. Patient/Guardian expressed understanding and agreed to proceed.    I discussed the assessment and treatment plan with the patient. The patient was provided an opportunity to ask questions and all were answered. The patient agreed with the plan and demonstrated an understanding of the instructions.   The patient was advised to call back or seek an in-person evaluation if the symptoms worsen or if the condition fails to improve as anticipated.   I provided 55 minutes of non-face-to-face time during this encounter.   Lennox Grumbles, LCSW   04/10/2022

## 2022-04-21 ENCOUNTER — Telehealth (INDEPENDENT_AMBULATORY_CARE_PROVIDER_SITE_OTHER): Payer: Self-pay

## 2022-04-21 NOTE — Telephone Encounter (Signed)
04/14/2022  Dear Kimberly Ortega, We have reviewed a prior authorization request for the member listed on this letter. Here is a summary of the decision and the next steps to take. What is a prior authorization? A prior authorization is an approval from the pharmacy benefit manager or plan to cover the cost of a medication. Why does my plan want me to get a prior authorization? A Safety: You may be taking the medication for a different condition than it was designed for. A Effectiveness: Your medication may not work as intended with other medications you take. A Cost control: Your plan may cover a lower-cost option that works the same way.  SUMMARY OF PRIOR AUTHORIZATION DECISION DECISION: DENIED This medication is not covered by your plan.   REASON: This decision is based on your plan's drug coverage policy for this medication. Please see the DECISION NOTES AND DETAILS section for more information. DRUG NAME: Dexlansopraz Cap '60mg'$  Dr Kimberly Ortega PATIENT INFO: Member ID: [id number] Case ID: [id number] PROVIDER: Maylon Peppers Mayorga [id number] QUESTIONS? We're here to help. Page 2 of 10 Visit [email or web address] or call the number on your member ID card. Case number: [id number] Talk with your provider about one of these options: 1. Switch to another medication that's covered by the plan. 2. You or your provider can appeal this decision. See APPEALS section of NEXT STEPS: this notice. 3. You can ask for an external review of this decision. See OTHER RESOURCES section of this letter.

## 2022-05-15 ENCOUNTER — Telehealth (INDEPENDENT_AMBULATORY_CARE_PROVIDER_SITE_OTHER): Payer: 59 | Admitting: Clinical

## 2022-05-15 DIAGNOSIS — F410 Panic disorder [episodic paroxysmal anxiety] without agoraphobia: Secondary | ICD-10-CM

## 2022-05-15 DIAGNOSIS — F419 Anxiety disorder, unspecified: Secondary | ICD-10-CM

## 2022-05-15 DIAGNOSIS — F331 Major depressive disorder, recurrent, moderate: Secondary | ICD-10-CM

## 2022-05-15 NOTE — Progress Notes (Signed)
Virtual Visit via Video Note   I connected with Kimberly Ortega on 05/15/22 at  11:00 AM EST by a video enabled telemedicine application and verified that I am speaking with the correct person using two identifiers.   Location: Patient: Home Provider: Office   I discussed the limitations of evaluation and management by telemedicine and the availability of in person appointments. The patient expressed understanding and agreed to proceed.       THERAPIST PROGRESS NOTE     Session Time: 11:00 AM-11:30 AM   Participation Level: Active   Behavioral Response: Casual and Alert,Depressed   Type of Therapy: Individual Therapy   Treatment Goals addressed: Depression and Anxiety   Interventions: CBT   Summary: Kimberly Ortega is a 56 y.o. female who presents with Depression with Anxiety and Panic Disorder. The OPT therapist worked with the patient for her ongoing OPT treatment. The OPT therapist utilized Motivational Interviewing to assist in creating therapeutic repore. The patient in the session was engaged and work in collaboration giving feedback about her triggers and symptoms over the past few weeks. The patient spoke about her ongoing struggle with not sleeping even though she is taking prescribed sleep aid. The OPT therapist utilized Cognitive Behavioral Therapy through cognitive restructuring as well as worked with the patient on coping strategies to assist in management of mood. The OPT therapist overviewed in session with the patient basic need areas examining the patients current eating habits, sleep schedule, exercise, and hygiene.  The patient identified finding a knott in her breast falling up with her health provider doing a ultrasound and finding a cyst in the breast and has been given a antibiotic she is taking for this.The OPT therapist placed emphasis on the patient continuing to stay consistent with her health providers and recommendations.   Suicidal/Homicidal: Nowithout  intent/plan   Therapist Response:The OPT therapist worked with the patient for the patients scheduled session. The patient was engaged in her session and gave feedback in relation to triggers, symptoms, and behavior responses over the past few weeks. The OPT therapist worked with the patient utilizing an in session Cognitive Behavioral Therapy exercise. The patient was responsive in the session and verbalized, " I am still having difficulty with my sleep only still getting  3/4 hours a night, but I am thankful for that, but it is not enough and its effecting everything else".  I around 3 days ago had the arm surgery post having the back surgery and I had a allergic reaction to the bandage adhesive.The patient spoke about being instructed to use ice after removal of the bandage the patients reaction has no improved and if it does not improve over the course of today she will be calling back to her health provider to get further instruction. The patient continues to struggle with her migrans  and is currently taking a antibiotic for her cyst in the breast. The patient spoke about her concern about her partner getting tired of all of her health problems. The OPT therapist worked with the patient to challenge her negative thoughts and empower her to continue to work with her health providers in working her health problems and getting to desired outcomes..The OPT therapist will continue treatment work with the patient in her next scheduled session   Plan: Return again in 2/3 weeks.   Diagnosis:      Axis I: Recurrent Moderate Major Depressive Disorder with Anxiety. Panic Disorder    Axis II: No diagnosis  Collaboration of Care: No additional collaboration of care for this session.   Patient/Guardian was advised Release of Information must be obtained prior to any record release in order to collaborate their care with an outside provider. Patient/Guardian was advised if they have not already done so to  contact the registration department to sign all necessary forms in order for [name] to release information regarding their care.    Consent: Patient/Guardian gives verbal consent for treatment and assignment of benefits for services provided during this visit. Patient/Guardian expressed understanding and agreed to proceed.    I discussed the assessment and treatment plan with the patient. The patient was provided an opportunity to ask questions and all were answered. The patient agreed with the plan and demonstrated an understanding of the instructions.   The patient was advised to call back or seek an in-person evaluation if the symptoms worsen or if the condition fails to improve as anticipated.   I provided 30 minutes of non-face-to-face time during this encounter.   Lennox Grumbles, LCSW   05/15/2022

## 2022-06-04 ENCOUNTER — Encounter: Payer: Self-pay | Admitting: Family Medicine

## 2022-06-05 ENCOUNTER — Other Ambulatory Visit: Payer: Self-pay | Admitting: Family Medicine

## 2022-06-05 MED ORDER — PROPRANOLOL HCL ER 60 MG PO CP24: 60.0000 mg | ORAL_CAPSULE | Freq: Every day | ORAL | 1 refills | Status: DC

## 2022-06-05 NOTE — Progress Notes (Signed)
Propranolol LA

## 2022-06-19 ENCOUNTER — Other Ambulatory Visit: Payer: Self-pay

## 2022-06-19 ENCOUNTER — Ambulatory Visit (INDEPENDENT_AMBULATORY_CARE_PROVIDER_SITE_OTHER): Payer: 59 | Admitting: Clinical

## 2022-06-19 DIAGNOSIS — F410 Panic disorder [episodic paroxysmal anxiety] without agoraphobia: Secondary | ICD-10-CM

## 2022-06-19 DIAGNOSIS — F419 Anxiety disorder, unspecified: Secondary | ICD-10-CM | POA: Diagnosis not present

## 2022-06-19 DIAGNOSIS — F331 Major depressive disorder, recurrent, moderate: Secondary | ICD-10-CM

## 2022-06-19 MED ORDER — AMITRIPTYLINE HCL 10 MG PO TABS: 20.0000 mg | ORAL_TABLET | Freq: Every day | ORAL | 0 refills | Status: DC

## 2022-06-19 NOTE — Progress Notes (Signed)
Virtual Visit via Video Note   I connected with Kimberly Ortega on 06/19/22 at  11:20 AM EST by a video enabled telemedicine application and verified that I am speaking with the correct person using two identifiers.   Location: Patient: Home Provider: Office   I discussed the limitations of evaluation and management by telemedicine and the availability of in person appointments. The patient expressed understanding and agreed to proceed.       THERAPIST PROGRESS NOTE     Session Time: 11:20 AM-11:50 AM   Participation Level: Active   Behavioral Response: Casual and Alert,Depressed   Type of Therapy: Individual Therapy   Treatment Goals addressed: Depression and Anxiety   Interventions: CBT   Summary: Kimberly Ortega is a 56 y.o. female who presents with Depression with Anxiety and Panic Disorder. The OPT therapist worked with the patient for her ongoing OPT treatment. The OPT therapist utilized Motivational Interviewing to assist in creating therapeutic repore. The patient in the session was engaged and work in collaboration giving feedback about her triggers and symptoms over the past few weeks. The patient spoke about her ongoing struggle with not sleeping even though she is taking prescribed sleep aid. The OPT therapist utilized Cognitive Behavioral Therapy through cognitive restructuring as well as worked with the patient on coping strategies to assist in management of mood. The OPT therapist overviewed in session with the patient basic need areas examining the patients current eating habits, sleep schedule, exercise, and hygiene.  The patient identified feeling overwhelmed by her physical health problems and noted, " I dont know how much longer I can hang on". The OPT therapists gave safety disclaimer letting the patient know if she feels she is in crisis a risk to herself or others to go to the hospital for a inpatient evaluation..The OPT therapist placed emphasis on the patient continuing  to stay consistent with her health providers and recommendations.   Suicidal/Homicidal: Nowithout intent/plan   Therapist Response:The OPT therapist worked with the patient for the patients scheduled session. The patient was engaged in her session and gave feedback in relation to triggers, symptoms, and behavior responses over the past few weeks. The OPT therapist worked with the patient utilizing an in session Cognitive Behavioral Therapy exercise. The patient was responsive in the session and verbalized, " I am not going to quit I am going to keep trying to get my physical health problems better ". The patient continues to struggle with her migrans, cyst in the breast, back pain, and sleep. The patient spoke about her concern about her partner getting tired of all of her health problems. The OPT therapist worked with the patient to challenge her negative thoughts and empower her to continue to work with her health providers in working her health problems and getting to desired outcomes..The OPT therapist will continue treatment work with the patient in her next scheduled session   Plan: Return again in 2/3 weeks.   Diagnosis:      Axis I: Recurrent Moderate Major Depressive Disorder with Anxiety. Panic Disorder    Axis II: No diagnosis      Collaboration of Care: No additional collaboration of care for this session.   Patient/Guardian was advised Release of Information must be obtained prior to any record release in order to collaborate their care with an outside provider. Patient/Guardian was advised if they have not already done so to contact the registration department to sign all necessary forms in order for [name] to release information regarding  their care.    Consent: Patient/Guardian gives verbal consent for treatment and assignment of benefits for services provided during this visit. Patient/Guardian expressed understanding and agreed to proceed.    I discussed the assessment and treatment  plan with the patient. The patient was provided an opportunity to ask questions and all were answered. The patient agreed with the plan and demonstrated an understanding of the instructions.   The patient was advised to call back or seek an in-person evaluation if the symptoms worsen or if the condition fails to improve as anticipated.   I provided 30 minutes of non-face-to-face time during this encounter.   Lennox Grumbles, LCSW   06/19/2022

## 2022-06-19 NOTE — Plan of Care (Signed)
Verbal Consent 

## 2022-06-23 ENCOUNTER — Telehealth: Payer: Self-pay | Admitting: Family Medicine

## 2022-06-23 NOTE — Telephone Encounter (Signed)
What dx code would you like to use? Options: Q22.297, L89.211, G43.701, H41.740

## 2022-06-23 NOTE — Telephone Encounter (Signed)
Can you guys help me get Botox for migraine prevention ordered for her. TY!

## 2022-06-23 NOTE — Telephone Encounter (Signed)
Needs Botox authorization. New start.   Chronic Migraine CPT 64615   Botox J0585 Units:200   G43.709 Chronic Migraine without aura, not intractable, without status migrainous

## 2022-06-24 ENCOUNTER — Telehealth (HOSPITAL_COMMUNITY): Payer: Self-pay

## 2022-06-24 ENCOUNTER — Other Ambulatory Visit (HOSPITAL_COMMUNITY): Payer: Self-pay

## 2022-06-24 NOTE — Telephone Encounter (Signed)
[employer]otox[employer] verification submitted   Key # [id number]

## 2022-06-25 ENCOUNTER — Other Ambulatory Visit: Payer: Self-pay

## 2022-06-25 MED ORDER — ONDANSETRON 4 MG PO TBDP: ORAL_TABLET | ORAL | 0 refills | Status: DC

## 2022-06-25 NOTE — Telephone Encounter (Signed)
   Botox[employer] benefits scanned to chart

## 2022-06-26 ENCOUNTER — Other Ambulatory Visit (HOSPITAL_COMMUNITY): Payer: Self-pay

## 2022-06-26 NOTE — Telephone Encounter (Signed)
Patient Advocate Encounter   Received notification that prior authorization for Botox 200UNIT solution is required.   PA submitted on 06/26/2022 Key [id number]  Status is pending       Lyndel Safe, [place] Patient Advocate Specialist [place] Patient Advocate Team Direct Number: [phone]  Fax: [phone]

## 2022-06-26 NOTE — Telephone Encounter (Signed)
Patient Advocate Encounter  Prior Authorization for Botox 200UNIT solution  has been approved.    PA# [id number] Effective dates: 06/26/2022 through 09/26/2022  Has to be filled at a [place] [place] Patient Advocate Specialist [place] Patient Advocate Team Direct Number: [phone]  Fax: [phone]

## 2022-06-29 ENCOUNTER — Ambulatory Visit: Payer: Self-pay | Admitting: Family Medicine

## 2022-06-29 ENCOUNTER — Other Ambulatory Visit: Payer: Self-pay | Admitting: *Deleted

## 2022-06-29 DIAGNOSIS — G43109 Migraine with aura, not intractable, without status migrainosus: Secondary | ICD-10-CM

## 2022-06-29 MED ORDER — ONABOTULINUMTOXINA 200 UNITS IJ SOLR: INTRAMUSCULAR | 3 refills | Status: DC

## 2022-06-29 NOTE — Telephone Encounter (Signed)
E-scribed Botox rx to [facility].

## 2022-06-30 NOTE — Telephone Encounter (Signed)
Called [facility] and spoke with [employer]. States botox rx still processing by pharmacist. Recommended we call back in 24 hours to see if we can schedule shipment.

## 2022-07-01 NOTE — Telephone Encounter (Signed)
Called [facility] at [phone]. Spoke w/ Thayer Headings. Insurance verification pending right now.

## 2022-07-02 NOTE — Telephone Encounter (Signed)
Called [facility] at [phone]. Spoke w/ Dorothea Ogle. Still pending under insurance verification. I provided approval info. He placed me on hold to speak with insurance team to get update. He was able to get insurance to be resolve verification.  Dorothea Ogle is sending message out to request rx to be reviewed stat by pharmacy. We should be able to schedule shipment on Monday 07/06/22. I will call Monday to try and schedule shipment.

## 2022-07-14 NOTE — Telephone Encounter (Signed)
Called pt. She has not received any missed calls from [facility]. She has not read mychart sent by our office either. Advised she needs to call them at [phone] to give consent to ship botox. Asked her to send mychart message once she completes this so that we can call them to schedule shipment. We can then get her scheduled for procedure once this is complete. She verbalized understanding.

## 2022-07-14 NOTE — Telephone Encounter (Addendum)
Took call from [employer] and spoke w/ Film/video editor. Scheduled shipment of Botox to arrive 07/15/22 at [facility]. Verified our address.  I called pt. Scheduled botox appt 07/23/22 at 3:30pm with AL,NP. She will see if someone can drive her to appt. States she is worried if she has migraine that day and vision problems, she will not be able to drive. I asked her to call if she needed to r/s for another day. She verbalized understanding.

## 2022-07-16 NOTE — Progress Notes (Deleted)
07/16/22 ALL: Nashika presents for her first Botox procedure. Baseline 15 migraine days per month. She continues amitriptyline '40mg'$  at bedtime and rizatritpan as needed.     Consent Form Botulism Toxin Injection For Chronic Migraine    Reviewed orally with patient, additionally signature is on file:  Botulism toxin has been approved by the Federal drug administration for treatment of chronic migraine. Botulism toxin does not cure chronic migraine and it may not be effective in some patients.  The administration of botulism toxin is accomplished by injecting a small amount of toxin into the muscles of the neck and head. Dosage must be titrated for each individual. Any benefits resulting from botulism toxin tend to wear off after 3 months with a repeat injection required if benefit is to be maintained. Injections are usually done every 3-4 months with maximum effect peak achieved by about 2 or 3 weeks. Botulism toxin is expensive and you should be sure of what costs you will incur resulting from the injection.  The side effects of botulism toxin use for chronic migraine may include:   -Transient, and usually mild, facial weakness with facial injections  -Transient, and usually mild, head or neck weakness with head/neck injections  -Reduction or loss of forehead facial animation due to forehead muscle weakness  -Eyelid drooping  -Dry eye  -Pain at the site of injection or bruising at the site of injection  -Double vision  -Potential unknown long term risks   Contraindications: You should not have Botox if you are pregnant, nursing, allergic to albumin, have an infection, skin condition, or muscle weakness at the site of the injection, or have myasthenia gravis, Lambert-Eaton syndrome, or ALS.  It is also possible that as with any injection, there may be an allergic reaction or no effect from the medication. Reduced effectiveness after repeated injections is sometimes seen and rarely  infection at the injection site may occur. All care will be taken to prevent these side effects. If therapy is given over a long time, atrophy and wasting in the muscle injected may occur. Occasionally the patient's become refractory to treatment because they develop antibodies to the toxin. In this event, therapy needs to be modified.  I have read the above information and consent to the administration of botulism toxin.    BOTOX PROCEDURE NOTE FOR MIGRAINE HEADACHE  Contraindications and precautions discussed with patient(above). Aseptic procedure was observed and patient tolerated procedure. Procedure performed by Debbora Presto, FNP-C.   The condition has existed for more than 6 months, and pt does not have a diagnosis of ALS, Myasthenia Gravis or Lambert-Eaton Syndrome.  Risks and benefits of injections discussed and pt agrees to proceed with the procedure.  Written consent obtained  These injections are medically necessary. Pt  receives good benefits from these injections. These injections do not cause sedations or hallucinations which the oral therapies may cause.   Description of procedure:  The patient was placed in a sitting position. The standard protocol was used for Botox as follows, with 5 units of Botox injected at each site:  -Procerus muscle, midline injection  -Corrugator muscle, bilateral injection  -Frontalis muscle, bilateral injection, with 2 sites each side, medial injection was performed in the upper one third of the frontalis muscle, in the region vertical from the medial inferior edge of the superior orbital rim. The lateral injection was again in the upper one third of the forehead vertically above the lateral limbus of the cornea, 1.5 cm lateral to  the medial injection site.  -Temporalis muscle injection, 4 sites, bilaterally. The first injection was 3 cm above the tragus of the ear, second injection site was 1.5 cm to 3 cm up from the first injection site in line with  the tragus of the ear. The third injection site was 1.5-3 cm forward between the first 2 injection sites. The fourth injection site was 1.5 cm posterior to the second injection site. 5th site laterally in the temporalis  muscleat the level of the outer canthus.  -Occipitalis muscle injection, 3 sites, bilaterally. The first injection was done one half way between the occipital protuberance and the tip of the mastoid process behind the ear. The second injection site was done lateral and superior to the first, 1 fingerbreadth from the first injection. The third injection site was 1 fingerbreadth superiorly and medially from the first injection site.  -Cervical paraspinal muscle injection, 2 sites, bilaterally. The first injection site was 1 cm from the midline of the cervical spine, 3 cm inferior to the lower border of the occipital protuberance. The second injection site was 1.5 cm superiorly and laterally to the first injection site.  -Trapezius muscle injection was performed at 3 sites, bilaterally. The first injection site was in the upper trapezius muscle halfway between the inflection point of the neck, and the acromion. The second injection site was one half way between the acromion and the first injection site. The third injection was done between the first injection site and the inflection point of the neck.   Will return for repeat injection in 3 months.   A total of 200 units of Botox was prepared, 155 units of Botox was injected as documented above, any Botox not injected was wasted. The patient tolerated the procedure well, there were no complications of the above procedure.

## 2022-07-23 ENCOUNTER — Ambulatory Visit: Payer: Self-pay | Admitting: Family Medicine

## 2022-07-23 DIAGNOSIS — G43109 Migraine with aura, not intractable, without status migrainosus: Secondary | ICD-10-CM

## 2022-07-23 NOTE — Progress Notes (Signed)
07/23/22 ALL: Kimberly Ortega presents for her first Botox procedure. Baseline 15 migraine days per month. She was previously taking amitriptyline '20mg'$  at bedtime and propranolol LA '60mg'$  daily. She discontinued both propranolol and amitriptyline a few weeks ago. She continues rizatriptan as needed.     Consent Form Botulism Toxin Injection For Chronic Migraine    Reviewed orally with patient, additionally signature is on file:  Botulism toxin has been approved by the Federal drug administration for treatment of chronic migraine. Botulism toxin does not cure chronic migraine and it may not be effective in some patients.  The administration of botulism toxin is accomplished by injecting a small amount of toxin into the muscles of the neck and head. Dosage must be titrated for each individual. Any benefits resulting from botulism toxin tend to wear off after 3 months with a repeat injection required if benefit is to be maintained. Injections are usually done every 3-4 months with maximum effect peak achieved by about 2 or 3 weeks. Botulism toxin is expensive and you should be sure of what costs you will incur resulting from the injection.  The side effects of botulism toxin use for chronic migraine may include:   -Transient, and usually mild, facial weakness with facial injections  -Transient, and usually mild, head or neck weakness with head/neck injections  -Reduction or loss of forehead facial animation due to forehead muscle weakness  -Eyelid drooping  -Dry eye  -Pain at the site of injection or bruising at the site of injection  -Double vision  -Potential unknown long term risks   Contraindications: You should not have Botox if you are pregnant, nursing, allergic to albumin, have an infection, skin condition, or muscle weakness at the site of the injection, or have myasthenia gravis, Lambert-Eaton syndrome, or ALS.  It is also possible that as with any injection, there may be an allergic  reaction or no effect from the medication. Reduced effectiveness after repeated injections is sometimes seen and rarely infection at the injection site may occur. All care will be taken to prevent these side effects. If therapy is given over a long time, atrophy and wasting in the muscle injected may occur. Occasionally the patient's become refractory to treatment because they develop antibodies to the toxin. In this event, therapy needs to be modified.  I have read the above information and consent to the administration of botulism toxin.    BOTOX PROCEDURE NOTE FOR MIGRAINE HEADACHE  Contraindications and precautions discussed with patient(above). Aseptic procedure was observed and patient tolerated procedure. Procedure performed by Debbora Presto, FNP-C.   The condition has existed for more than 6 months, and pt does not have a diagnosis of ALS, Myasthenia Gravis or Lambert-Eaton Syndrome.  Risks and benefits of injections discussed and pt agrees to proceed with the procedure.  Written consent obtained  These injections are medically necessary. Pt  receives good benefits from these injections. These injections do not cause sedations or hallucinations which the oral therapies may cause.   Description of procedure:  The patient was placed in a sitting position. The standard protocol was used for Botox as follows, with 5 units of Botox injected at each site:  -Procerus muscle, midline injection  -Corrugator muscle, bilateral injection  -Frontalis muscle, bilateral injection, with 2 sites each side, medial injection was performed in the upper one third of the frontalis muscle, in the region vertical from the medial inferior edge of the superior orbital rim. The lateral injection was again in the  upper one third of the forehead vertically above the lateral limbus of the cornea, 1.5 cm lateral to the medial injection site.  -Temporalis muscle injection, 4 sites, bilaterally. The first injection was  3 cm above the tragus of the ear, second injection site was 1.5 cm to 3 cm up from the first injection site in line with the tragus of the ear. The third injection site was 1.5-3 cm forward between the first 2 injection sites. The fourth injection site was 1.5 cm posterior to the second injection site. 5th site laterally in the temporalis  muscleat the level of the outer canthus.  -Occipitalis muscle injection, 3 sites, bilaterally. The first injection was done one half way between the occipital protuberance and the tip of the mastoid process behind the ear. The second injection site was done lateral and superior to the first, 1 fingerbreadth from the first injection. The third injection site was 1 fingerbreadth superiorly and medially from the first injection site.  -Cervical paraspinal muscle injection, 2 sites, bilaterally. The first injection site was 1 cm from the midline of the cervical spine, 3 cm inferior to the lower border of the occipital protuberance. The second injection site was 1.5 cm superiorly and laterally to the first injection site.  -Trapezius muscle injection was performed at 3 sites, bilaterally. The first injection site was in the upper trapezius muscle halfway between the inflection point of the neck, and the acromion. The second injection site was one half way between the acromion and the first injection site. The third injection was done between the first injection site and the inflection point of the neck.   Will return for repeat injection in 3 months.   A total of 200 units of Botox was prepared, 155 units of Botox was injected as documented above, any Botox not injected was wasted. The patient tolerated the procedure well, there were no complications of the above procedure.

## 2022-07-27 ENCOUNTER — Ambulatory Visit (INDEPENDENT_AMBULATORY_CARE_PROVIDER_SITE_OTHER): Payer: 59 | Admitting: Family Medicine

## 2022-07-27 ENCOUNTER — Encounter: Payer: Self-pay | Admitting: Family Medicine

## 2022-07-27 DIAGNOSIS — G43109 Migraine with aura, not intractable, without status migrainosus: Secondary | ICD-10-CM

## 2022-07-27 MED ORDER — ONABOTULINUMTOXINA 200 UNITS IJ SOLR
155.0000 [IU] | Freq: Once | INTRAMUSCULAR | Status: AC
  Administered 2022-07-27: 155 [IU] via INTRAMUSCULAR

## 2022-07-27 MED ORDER — NURTEC 75 MG PO TBDP: 75.0000 mg | ORAL_TABLET | Freq: Every day | ORAL | 11 refills | Status: DC | PRN

## 2022-07-28 ENCOUNTER — Ambulatory Visit (INDEPENDENT_AMBULATORY_CARE_PROVIDER_SITE_OTHER): Payer: 59 | Admitting: Clinical

## 2022-07-28 ENCOUNTER — Telehealth: Payer: Self-pay | Admitting: Neurology

## 2022-07-28 DIAGNOSIS — F331 Major depressive disorder, recurrent, moderate: Secondary | ICD-10-CM

## 2022-07-28 DIAGNOSIS — F410 Panic disorder [episodic paroxysmal anxiety] without agoraphobia: Secondary | ICD-10-CM

## 2022-07-28 DIAGNOSIS — F419 Anxiety disorder, unspecified: Secondary | ICD-10-CM | POA: Diagnosis not present

## 2022-07-28 NOTE — Telephone Encounter (Signed)
Received fax from [facility] that PA approved 07/28/22-10/28/22.  Case number: [id number]

## 2022-07-28 NOTE — Telephone Encounter (Signed)
PA completed on [facility] KEY: [id number] Will await determination

## 2022-07-28 NOTE — Progress Notes (Signed)
Virtual Visit via Video Note   I connected with Kimberly Ortega on 07/28/22 at  11:00 AM EST by a video enabled telemedicine application and verified that I am speaking with the correct person using two identifiers.   Location: Patient: Home Provider: Office   I discussed the limitations of evaluation and management by telemedicine and the availability of in person appointments. The patient expressed understanding and agreed to proceed.       THERAPIST PROGRESS NOTE     Session Time: 11:00 AM-11:55 AM   Participation Level: Active   Behavioral Response: Casual and Alert,Depressed   Type of Therapy: Individual Therapy   Treatment Goals addressed: Depression and Anxiety   Interventions: CBT   Summary: Kimberly Ortega is a 56 y.o. female who presents with Depression with Anxiety and Panic Disorder. The OPT therapist worked with the patient for her ongoing OPT treatment. The OPT therapist utilized Motivational Interviewing to assist in creating therapeutic repore. The patient in the session was engaged and work in collaboration giving feedback about her triggers and symptoms over the past few weeks. The patient spoke about her ongoing struggle with not sleeping and has self discontinued her prescribed sleep aid. The OPT therapist utilized Cognitive Behavioral Therapy through cognitive restructuring as well as worked with the patient on coping strategies to assist in management of mood. The OPT therapist overviewed in session with the patient basic need areas examining the patients current eating habits, sleep schedule, exercise, and hygiene.  The patient identified feeling overwhelmed by her physical health problems and has a upcoming appointment with her PCP. The patient notes that she did see her Neurologist yesterday and received injections to help her with her cluster migraines.  Additionally the patient seen her spine doctor and got an epidural for inflammation to reduce spine pain which she  indicates did not help.The OPT therapist placed emphasis on the patient continuing to stay consistent with her health providers and recommendations as well as overviewing with a prescriber before self stopping a prescribed medication.   Suicidal/Homicidal: Nowithout intent/plan   Therapist Response:The OPT therapist worked with the patient for the patients scheduled session. The patient was engaged in her session and gave feedback in relation to triggers, symptoms, and behavior responses over the past few weeks. The OPT therapist worked with the patient utilizing an in session Cognitive Behavioral Therapy exercise. The patient was responsive in the session and verbalized, " I still have not been able to get to sleep and the medicine I was taking was not working and I think I tried the medication long enough and so I decided to stop taking the sleep medication". The patient spoke about her concern about her partner getting tired of all of her health problems. The OPT therapist worked with the patient to challenge her negative thoughts and empower her to continue to work with her health providers in working her health problems and getting to desired outcomes. The patients physical health identified problems continue to be priority and are triggers to her mental health. The patient presented as manic/hyper verbal and indicated nothing seems to be helping her physical health problems  including migraines, spine/back pain, sleep, acid reflux,which exasperate her MH symptoms. Additionally the patient feels her medications has caused weight gain which has impacted her negatively. Additionally the patient is involved with dermatology who will be doing a body check for Cancer. The OPT therapist will continue treatment work with the patient in her next scheduled session   Plan: Return  again in 2/3 weeks.   Diagnosis:      Axis I: Recurrent Moderate Major Depressive Disorder with Anxiety. Panic Disorder    Axis II:  No diagnosis      Collaboration of Care: No additional collaboration of care for this session.   Patient/Guardian was advised Release of Information must be obtained prior to any record release in order to collaborate their care with an outside provider. Patient/Guardian was advised if they have not already done so to contact the registration department to sign all necessary forms in order for [name] to release information regarding their care.    Consent: Patient/Guardian gives verbal consent for treatment and assignment of benefits for services provided during this visit. Patient/Guardian expressed understanding and agreed to proceed.    I discussed the assessment and treatment plan with the patient. The patient was provided an opportunity to ask questions and all were answered. The patient agreed with the plan and demonstrated an understanding of the instructions.   The patient was advised to call back or seek an in-person evaluation if the symptoms worsen or if the condition fails to improve as anticipated.   I provided 55 minutes of non-face-to-face time during this encounter.   Lennox Grumbles, LCSW   07/28/2022

## 2022-08-25 ENCOUNTER — Ambulatory Visit (INDEPENDENT_AMBULATORY_CARE_PROVIDER_SITE_OTHER): Payer: 59 | Admitting: Clinical

## 2022-08-25 ENCOUNTER — Ambulatory Visit (INDEPENDENT_AMBULATORY_CARE_PROVIDER_SITE_OTHER): Payer: 59 | Admitting: Diagnostic Neuroimaging

## 2022-08-25 ENCOUNTER — Encounter: Payer: Self-pay | Admitting: Diagnostic Neuroimaging

## 2022-08-25 VITALS — BP 132/80 | HR 77 | Ht 67.0 in | Wt 222.6 lb

## 2022-08-25 DIAGNOSIS — M5416 Radiculopathy, lumbar region: Secondary | ICD-10-CM | POA: Diagnosis not present

## 2022-08-25 DIAGNOSIS — F419 Anxiety disorder, unspecified: Secondary | ICD-10-CM | POA: Diagnosis not present

## 2022-08-25 DIAGNOSIS — F331 Major depressive disorder, recurrent, moderate: Secondary | ICD-10-CM | POA: Diagnosis not present

## 2022-08-25 DIAGNOSIS — F410 Panic disorder [episodic paroxysmal anxiety] without agoraphobia: Secondary | ICD-10-CM | POA: Diagnosis not present
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
## 2022-08-25 NOTE — Progress Notes (Signed)
Virtual Visit via Telephone Note  I connected with Kimberly Ortega on 08/25/22 at  9:00 AM EST by telephone and verified that I am speaking with the correct person using two identifiers.  Location: Patient: Home Provider: Office   I discussed the limitations, risks, security and privacy concerns of performing an evaluation and management service by telephone and the availability of in person appointments. I also discussed with the patient that there may be a patient responsible charge related to this service. The patient expressed understanding and agreed to proceed.  THERAPIST PROGRESS NOTE     Session Time: 9:00 AM-9:30 AM   Participation Level: Active   Behavioral Response: Casual and Alert,Depressed   Type of Therapy: Individual Therapy   Treatment Goals addressed: Depression and Anxiety   Interventions: CBT   Summary: Kimberly Ortega is a 56 y.o. female who presents with Depression with Anxiety and Panic Disorder. The OPT therapist worked with the patient for her ongoing OPT treatment. The OPT therapist utilized Motivational Interviewing to assist in creating therapeutic repore. The patient in the session was engaged and work in collaboration giving feedback about her triggers and symptoms over the past few weeks. The patient spoke about her ongoing health struggles with migraines,sciatica, pain in throat and sleep. The patient has been doing a botox to assist with migraines. The OPT therapist utilized Cognitive Behavioral Therapy through cognitive restructuring as well as worked with the patient on coping strategies to assist in management of mood. The OPT therapist overviewed in session with the patient basic need areas examining the patients current eating habits, sleep schedule, exercise, and hygiene.  The patient identified feeling overwhelmed by her physical health problems, however, spoke about  willingness to continue ongoing work to with her physical health providers to treat her  physical health.The OPT therapist placed emphasis on the patient continuing to stay consistent with her health providers.   Suicidal/Homicidal: Nowithout intent/plan   Therapist Response:The OPT therapist worked with the patient for the patients scheduled session. The patient was engaged in her session and gave feedback in relation to triggers, symptoms, and behavior responses over the past few weeks. The OPT therapist worked with the patient utilizing an in session Cognitive Behavioral Therapy exercise. The patient was responsive in the session and verbalized, " I feel like my routine has become wake up go to doctor appointments and come home". The patient spoke about her concern about her partner getting tired of all of her health problems. The OPT therapist worked with the patient to challenge her negative thoughts and empower her to continue to work with her health providers in working her health problems and getting to desired outcomes. The patients physical health identified problems continue to be priority and are triggers to her mental health. The OPT therapist worked with the patient on importance of utilizing coping skills and consistency with the recommendations ongoing of her physical health providers. The  patient is scheduled later today with a health provider to do a test for her sciatica. The OPT therapist will continue treatment work with the patient in her next scheduled session   Plan: Return again in 2/3 weeks.   Diagnosis:      Axis I: Recurrent Moderate Major Depressive Disorder with Anxiety. Panic Disorder    Axis II: No diagnosis      Collaboration of Care: No additional collaboration of care for this session.   Patient/Guardian was advised Release of Information must be obtained prior to any record release in order  to collaborate their care with an outside provider. Patient/Guardian was advised if they have not already done so to contact the registration department to sign all  necessary forms in order for [name] to release information regarding their care.    Consent: Patient/Guardian gives verbal consent for treatment and assignment of benefits for services provided during this visit. Patient/Guardian expressed understanding and agreed to proceed.    I discussed the assessment and treatment plan with the patient. The patient was provided an opportunity to ask questions and all were answered. The patient agreed with the plan and demonstrated an understanding of the instructions.   The patient was advised to call back or seek an in-person evaluation if the symptoms worsen or if the condition fails to improve as anticipated.   I provided 30 minutes of non-face-to-face time during this encounter.   Lennox Grumbles, LCSW   08/25/2022

## 2022-08-31 ENCOUNTER — Telehealth: Payer: Self-pay | Admitting: Pharmacy Technician

## 2022-08-31 NOTE — Telephone Encounter (Signed)
Patient Advocate Encounter   Received notification that prior authorization for Botox 200UNIT solution is required.   PA submitted on 08/31/2022 Key [id number]  Status is pending       Lyndel Safe, [place] Patient Advocate Specialist [place] Patient Advocate Team Direct Number: [phone]  Fax: [phone]

## 2022-08-31 NOTE — Telephone Encounter (Signed)
Patient Advocate Encounter  Prior Authorization for Botox 200UNIT solution  has been approved.    PA# [id number] Effective dates: 08/31/2022 through 11/30/2022  Has to be filled at a [place], [place] Patient Advocate Specialist [place] Patient Advocate Team Direct Number: [phone]  Fax: [phone]

## 2022-08-31 NOTE — Telephone Encounter (Signed)
LVM for pt letting her know we get 90 days supply each time we fill her botox. 200U vial= 90 days supply. PA approved and she has to fill via [facility].

## 2022-08-31 NOTE — Telephone Encounter (Signed)
Pt is calling.Requesting a 3 month supply of Botox. She is requesting a call back from nurse

## 2022-09-16 ENCOUNTER — Ambulatory Visit (INDEPENDENT_AMBULATORY_CARE_PROVIDER_SITE_OTHER): Payer: 59 | Admitting: Clinical

## 2022-09-16 DIAGNOSIS — F331 Major depressive disorder, recurrent, moderate: Secondary | ICD-10-CM

## 2022-09-16 DIAGNOSIS — F419 Anxiety disorder, unspecified: Secondary | ICD-10-CM | POA: Diagnosis not present

## 2022-09-16 NOTE — Telephone Encounter (Signed)
Pt called wanting to discuss her BOTOX delivery with RN. Pt states it needs to be here today due to Pharmacy. Please advise.

## 2022-09-16 NOTE — Telephone Encounter (Signed)
Called and scheduled Botox delivery for tomorrow, 09/17/22. LVM advising pt that her delivery has been scheduled.

## 2022-09-16 NOTE — Telephone Encounter (Signed)
Pt called back and stated that she was able to get a hold of the pharmacy and she said that the number to set up delivery is [phone] and that her deductible is 0 right now and she would appreciate if it is called in today by 5pm. Pt would like to be called once this is done.

## 2022-09-16 NOTE — Progress Notes (Signed)
Virtual Visit via Telephone Note   I connected with Kimberly Ortega on 09/16/22 at  9:00 AM EST by telephone and verified that I am speaking with the correct person using two identifiers.   Location: Patient: Home Provider: Office   I discussed the limitations, risks, security and privacy concerns of performing an evaluation and management service by telephone and the availability of in person appointments. I also discussed with the patient that there may be a patient responsible charge related to this service. The patient expressed understanding and agreed to proceed.   THERAPIST PROGRESS NOTE     Session Time: 9:00 AM-9:45 AM   Participation Level: Active   Behavioral Response: Casual and Alert,Depressed   Type of Therapy: Individual Therapy   Treatment Goals addressed: Depression and Anxiety   Interventions: CBT   Summary: Kimberly Ortega is a 56 y.o. female who presents with Depression with Anxiety and Panic Disorder. The OPT therapist worked with the patient for her ongoing OPT treatment. The OPT therapist utilized Motivational Interviewing to assist in creating therapeutic repore. The patient in the session was engaged and work in collaboration giving feedback about her triggers and symptoms over the past few weeks. The patient spoke about her ongoing health struggles with migraines,sciatica, pain in throat and sleep. The patient during holiday went to [place] with her partner to his sisters home and spoke about this being a trigger as she has had difficulty connecting with the patients sister and has felt left out since she arrived. The patient will be returning to [place] before the new Year and noted she feels like she will be more comfortable back in her own home environment.The OPT therapist utilized Cognitive Behavioral Therapy through cognitive restructuring as well as worked with the patient on coping strategies to assist in management of mood. The OPT therapist overviewed in session with  the patient basic need areas examining the patients current eating habits, sleep schedule, exercise, and hygiene.  The patient identified feeling overwhelmed by her physical health problems, however, spoke about  willingness to continue ongoing work to with her physical health providers to treat her physical health.The OPT therapist placed emphasis on the patient continuing to stay consistent with her health providers. The OPT therapist linked patient to resource Social Services and the patient is going to file for Disability.   Suicidal/Homicidal: Nowithout intent/plan   Therapist Response:The OPT therapist worked with the patient for the patients scheduled session. The patient was engaged in her session and gave feedback in relation to triggers, symptoms, and behavior responses over the past few weeks. The OPT therapist worked with the patient utilizing an in session Cognitive Behavioral Therapy exercise. The patient was responsive in the session and verbalized, " I feel like I am being agnored by my husband and his sister and I feel out of place here in [place]". The patient spoke about her concern about her partner getting tired of all of her health problems. The OPT therapist worked with the patient to challenge her negative thoughts and empower her to continue to work with her health providers in working her health problems and getting to desired outcomes. The patients physical health identified problems continue to be priority and are triggers to her mental health. The OPT therapist worked with the patient on importance of utilizing coping skills and consistency with the recommendations ongoing of her physical health providers. The  patient spoke about her stressors around finanaces and agreed to file for Disability. The OPT therapist will continue  treatment work with the patient in her next scheduled session   Plan: Return again in 2/3 weeks.   Diagnosis:      Axis I: Recurrent Moderate Major  Depressive Disorder with Anxiety. Panic Disorder    Axis II: No diagnosis      Collaboration of Care: No additional collaboration of care for this session.   Patient/Guardian was advised Release of Information must be obtained prior to any record release in order to collaborate their care with an outside provider. Patient/Guardian was advised if they have not already done so to contact the registration department to sign all necessary forms in order for [name] to release information regarding their care.    Consent: Patient/Guardian gives verbal consent for treatment and assignment of benefits for services provided during this visit. Patient/Guardian expressed understanding and agreed to proceed.    I discussed the assessment and treatment plan with the patient. The patient was provided an opportunity to ask questions and all were answered. The patient agreed with the plan and demonstrated an understanding of the instructions.   The patient was advised to call back or seek an in-person evaluation if the symptoms worsen or if the condition fails to improve as anticipated.   I provided 45 minutes of non-face-to-face time during this encounter.   Lennox Grumbles, LCSW   09/16/2022

## 2022-09-28 ENCOUNTER — Ambulatory Visit: Payer: 59 | Admitting: Family Medicine

## 2022-10-09 ENCOUNTER — Ambulatory Visit (INDEPENDENT_AMBULATORY_CARE_PROVIDER_SITE_OTHER): Payer: 59 | Admitting: Clinical

## 2022-10-09 DIAGNOSIS — F419 Anxiety disorder, unspecified: Secondary | ICD-10-CM

## 2022-10-09 DIAGNOSIS — F331 Major depressive disorder, recurrent, moderate: Secondary | ICD-10-CM | POA: Diagnosis not present

## 2022-10-09 DIAGNOSIS — F410 Panic disorder [episodic paroxysmal anxiety] without agoraphobia: Secondary | ICD-10-CM | POA: Diagnosis not present

## 2022-10-09 NOTE — Progress Notes (Signed)
Virtual Visit via Video Note  I connected with Kimberly Ortega on 10/09/22 at  9:00 AM EST by a video enabled telemedicine application and verified that I am speaking with the correct person using two identifiers.  Location: Patient: Home Provider: Office   I discussed the limitations of evaluation and management by telemedicine and the availability of in person appointments. The patient expressed understanding and agreed to proceed.  THERAPIST PROGRESS NOTE     Session Time: 9:00 AM-9:30 AM   Participation Level: Active   Behavioral Response: Casual and Alert,Depressed   Type of Therapy: Individual Therapy   Treatment Goals addressed: Depression and Anxiety   Interventions: CBT   Summary: Kimberly Ortega is a 57 y.o. female who presents with Depression with Anxiety and Panic Disorder. The OPT therapist worked with the patient for her ongoing OPT treatment. The OPT therapist utilized Motivational Interviewing to assist in creating therapeutic repore. The patient in the session was engaged and work in collaboration giving feedback about her triggers and symptoms over the past few weeks. The patient spoke about her ongoing health struggles with migraines,sciatica, pain in throat and sleep. The patient spoke about being back home from recent trip to [place]. The patient spoke about her dog being taken/stolen while she was in [place]. The patient spoke about her animal being gone triggered a panic attack. The patient spoke about learning that the her husbands sister from [place] is who stole the animal and she pressed charges and was able to get the dog back. The patient spoke about difficulty since the change of the new year with her insurance which has not been fixed and caused the patient to cancel health appointments due to not having insurance coverage.The OPT therapist utilized Cognitive Behavioral Therapy through cognitive restructuring as well as worked with the patient on coping strategies to  assist in management of mood. The OPT therapist overviewed in session with the patient basic need areas examining the patients current eating habits, sleep schedule, exercise, and hygiene.  The patient identified feeling overwhelmed by her physical health problems, however, spoke about  willingness to continue ongoing work to with her physical health providers to treat her physical health once her insurance is restored.The OPT therapist worked with the patient prioritizing her involvement in working to get her insurance restored, going to social services to apply for disability, and being consistent with her med therapy.    Suicidal/Homicidal: Nowithout intent/plan   Therapist Response:The OPT therapist worked with the patient for the patients scheduled session. The patient was engaged in her session and gave feedback in relation to triggers, symptoms, and behavior responses over the past few weeks. The OPT therapist worked with the patient utilizing an in session Cognitive Behavioral Therapy exercise. The patient was responsive in the session and verbalized, " I found out on the way back from [place] my dog was stolen and then I found out it was my husbands sister who I have had many years of conflict with who took the dog so I called the police and she was arrested and I was able to get my dog back and we have to go to court Feburary 28th for the hearing". The OPT therapist worked with the patient to challenge her negative thoughts and empower her to continue to work with her health providers in working her health problems and getting to desired outcomes and getting her health insurance worked back out after the new year so that she can have coverage for her medical  visits. The OPT therapist worked with the patient on importance of utilizing coping skills and consistency with the recommendations ongoing of her physical health providers. The  patient spoke about her stressors around finanaces and agreed to get  refocused and go to the Social Services office to apply for Disability. The OPT therapist will continue treatment work with the patient in her next scheduled session   Plan: Return again in 2/3 weeks.   Diagnosis:      Axis I: Recurrent Moderate Major Depressive Disorder with Anxiety. Panic Disorder    Axis II: No diagnosis      Collaboration of Care: No additional collaboration of care for this session.   Patient/Guardian was advised Release of Information must be obtained prior to any record release in order to collaborate their care with an outside provider. Patient/Guardian was advised if they have not already done so to contact the registration department to sign all necessary forms in order for [name] to release information regarding their care.    Consent: Patient/Guardian gives verbal consent for treatment and assignment of benefits for services provided during this visit. Patient/Guardian expressed understanding and agreed to proceed.    I discussed the assessment and treatment plan with the patient. The patient was provided an opportunity to ask questions and all were answered. The patient agreed with the plan and demonstrated an understanding of the instructions.   The patient was advised to call back or seek an in-person evaluation if the symptoms worsen or if the condition fails to improve as anticipated.   I provided 30 minutes of non-face-to-face time during this encounter.   Lennox Grumbles, LCSW   10/09/2022

## 2022-10-13 ENCOUNTER — Telehealth: Payer: Self-pay | Admitting: Pharmacy Technician

## 2022-10-13 NOTE — Telephone Encounter (Signed)
Patient Advocate Encounter   Received notification that prior authorization for Nurtec '75MG'$  dispersible tablets is required.   PA submitted on 10/13/2022 Key [id number] Status is pending       Lyndel Safe, [place] Patient Advocate Specialist [place] Patient Advocate Team Direct Number: [phone]  Fax: [phone]

## 2022-10-14 ENCOUNTER — Other Ambulatory Visit (HOSPITAL_COMMUNITY): Payer: Self-pay

## 2022-10-14 NOTE — Telephone Encounter (Signed)
Pharmacy Patient Advocate Encounter  Prior Authorization for Nurtec '75MG'$  dispersible tablets has been approved.    PA# PA Case ID #: [id number] Effective dates: 10/13/2022 through 10/14/2023  RXs require fill through [employer]. Call Retail [phone];     Mail [phone];Specialty [phone]

## 2022-10-15 ENCOUNTER — Ambulatory Visit: Payer: Self-pay | Admitting: Family Medicine

## 2022-10-21 NOTE — Progress Notes (Unsigned)
10/21/22 ALL: Kimberly Ortega returns for Botox.She reports migraines have improved by at least 50%. Nurtec did not seem much more effective than rizatriptan.   07/27/2022 ALL: Kimberly Ortega presents for her first Botox procedure. Baseline 15 migraine days per month. She was previously taking amitriptyline '20mg'$  at bedtime and propranolol LA '60mg'$  daily. She discontinued both propranolol and amitriptyline a few weeks ago. She continues rizatriptan as needed.     Consent Form Botulism Toxin Injection For Chronic Migraine    Reviewed orally with patient, additionally signature is on file:  Botulism toxin has been approved by the Federal drug administration for treatment of chronic migraine. Botulism toxin does not cure chronic migraine and it may not be effective in some patients.  The administration of botulism toxin is accomplished by injecting a small amount of toxin into the muscles of the neck and head. Dosage must be titrated for each individual. Any benefits resulting from botulism toxin tend to wear off after 3 months with a repeat injection required if benefit is to be maintained. Injections are usually done every 3-4 months with maximum effect peak achieved by about 2 or 3 weeks. Botulism toxin is expensive and you should be sure of what costs you will incur resulting from the injection.  The side effects of botulism toxin use for chronic migraine may include:   -Transient, and usually mild, facial weakness with facial injections  -Transient, and usually mild, head or neck weakness with head/neck injections  -Reduction or loss of forehead facial animation due to forehead muscle weakness  -Eyelid drooping  -Dry eye  -Pain at the site of injection or bruising at the site of injection  -Double vision  -Potential unknown long term risks   Contraindications: You should not have Botox if you are pregnant, nursing, allergic to albumin, have an infection, skin condition, or muscle weakness at the  site of the injection, or have myasthenia gravis, Lambert-Eaton syndrome, or ALS.  It is also possible that as with any injection, there may be an allergic reaction or no effect from the medication. Reduced effectiveness after repeated injections is sometimes seen and rarely infection at the injection site may occur. All care will be taken to prevent these side effects. If therapy is given over a long time, atrophy and wasting in the muscle injected may occur. Occasionally the patient's become refractory to treatment because they develop antibodies to the toxin. In this event, therapy needs to be modified.  I have read the above information and consent to the administration of botulism toxin.    BOTOX PROCEDURE NOTE FOR MIGRAINE HEADACHE  Contraindications and precautions discussed with patient(above). Aseptic procedure was observed and patient tolerated procedure. Procedure performed by Debbora Presto, FNP-C.   The condition has existed for more than 6 months, and pt does not have a diagnosis of ALS, Myasthenia Gravis or Lambert-Eaton Syndrome.  Risks and benefits of injections discussed and pt agrees to proceed with the procedure.  Written consent obtained  These injections are medically necessary. Pt  receives good benefits from these injections. These injections do not cause sedations or hallucinations which the oral therapies may cause.   Description of procedure:  The patient was placed in a sitting position. The standard protocol was used for Botox as follows, with 5 units of Botox injected at each site:  -Procerus muscle, midline injection  -Corrugator muscle, bilateral injection  -Frontalis muscle, bilateral injection, with 2 sites each side, medial injection was performed in the upper one third  of the frontalis muscle, in the region vertical from the medial inferior edge of the superior orbital rim. The lateral injection was again in the upper one third of the forehead vertically above  the lateral limbus of the cornea, 1.5 cm lateral to the medial injection site.  -Temporalis muscle injection, 4 sites, bilaterally. The first injection was 3 cm above the tragus of the ear, second injection site was 1.5 cm to 3 cm up from the first injection site in line with the tragus of the ear. The third injection site was 1.5-3 cm forward between the first 2 injection sites. The fourth injection site was 1.5 cm posterior to the second injection site. 5th site laterally in the temporalis  muscleat the level of the outer canthus.  -Occipitalis muscle injection, 3 sites, bilaterally. The first injection was done one half way between the occipital protuberance and the tip of the mastoid process behind the ear. The second injection site was done lateral and superior to the first, 1 fingerbreadth from the first injection. The third injection site was 1 fingerbreadth superiorly and medially from the first injection site.  -Cervical paraspinal muscle injection, 2 sites, bilaterally. The first injection site was 1 cm from the midline of the cervical spine, 3 cm inferior to the lower border of the occipital protuberance. The second injection site was 1.5 cm superiorly and laterally to the first injection site.  -Trapezius muscle injection was performed at 3 sites, bilaterally. The first injection site was in the upper trapezius muscle halfway between the inflection point of the neck, and the acromion. The second injection site was one half way between the acromion and the first injection site. The third injection was done between the first injection site and the inflection point of the neck.   Will return for repeat injection in 3 months.   A total of 200 units of Botox was prepared, 155 units of Botox was injected as documented above, any Botox not injected was wasted. The patient tolerated the procedure well, there were no complications of the above procedure.

## 2022-10-22 ENCOUNTER — Ambulatory Visit (INDEPENDENT_AMBULATORY_CARE_PROVIDER_SITE_OTHER): Payer: 59 | Admitting: Family Medicine

## 2022-10-22 DIAGNOSIS — G43109 Migraine with aura, not intractable, without status migrainosus: Secondary | ICD-10-CM | POA: Diagnosis not present

## 2022-10-22 MED ORDER — ONABOTULINUMTOXINA 200 UNITS IJ SOLR
155.0000 [IU] | Freq: Once | INTRAMUSCULAR | Status: AC
  Administered 2022-10-22: 155 [IU] via INTRAMUSCULAR

## 2022-10-22 MED ORDER — RIZATRIPTAN BENZOATE 10 MG PO TBDP: ORAL_TABLET | ORAL | 11 refills | Status: DC

## 2022-10-22 NOTE — Progress Notes (Signed)
.  Botox- 200 units x 1 vial Lot: [id number] Expiration: 02/2025 NDC: 7591-6384-66  Bacteriostatic 0.9% Sodium Chloride- 23m total LZLD:[id number]: 11/25 NDC: 693903-009-23 Dx:G43.109 S/P

## 2022-11-04 ENCOUNTER — Encounter: Payer: Self-pay | Admitting: Family Medicine

## 2022-11-06 ENCOUNTER — Ambulatory Visit (INDEPENDENT_AMBULATORY_CARE_PROVIDER_SITE_OTHER): Payer: 59 | Admitting: Clinical

## 2022-11-06 DIAGNOSIS — F419 Anxiety disorder, unspecified: Secondary | ICD-10-CM

## 2022-11-06 DIAGNOSIS — F331 Major depressive disorder, recurrent, moderate: Secondary | ICD-10-CM | POA: Diagnosis not present

## 2022-11-06 NOTE — Progress Notes (Signed)
Virtual Visit via Video Note   I connected with Kimberly Ortega on 11/06/22 at  9:00 AM EST by a video enabled telemedicine application and verified that I am speaking with the correct person using two identifiers.   Location: Patient: Home Provider: Office   I discussed the limitations of evaluation and management by telemedicine and the availability of in person appointments. The patient expressed understanding and agreed to proceed.   THERAPIST PROGRESS NOTE     Session Time: 9:00 AM-9:30 AM   Participation Level: Active   Behavioral Response: Casual and Alert,Depressed   Type of Therapy: Individual Therapy   Treatment Goals addressed: Depression and Anxiety   Interventions: CBT   Summary: Kimberly Ortega is a 57 y.o. female who presents with Depression with Anxiety and Panic Disorder. The OPT therapist worked with the patient for her ongoing OPT treatment. The OPT therapist utilized Motivational Interviewing to assist in creating therapeutic repore. The patient in the session was engaged and work in collaboration giving feedback about her triggers and symptoms over the past few weeks. The patient spoke about her ongoing health struggles with migraines,sciatica, pain in throat and sleep. The patient spoke about having a panic attack earlier this morning. The patient spoke about when this happens she takes a extra pill and this happens frequently so the patient runs the risk of running out of her medication. The OPT therapist advised the patient to overview this with her prescriber and to review if she needs. To change the dosage or type of medication she is currently taking. The OPT therapist utilized Cognitive Behavioral Therapy through cognitive restructuring as well as worked with the patient on coping strategies to assist in management of mood. The OPT therapist overviewed in session with the patient basic need areas examining the patients current eating habits, sleep schedule, exercise,  and hygiene.  The patient identified feeling overwhelmed by her physical health problems, however, spoke about  willingness to continue ongoing work to with her physical health providers to treat her physical health once her insurance is restored.The OPT therapist worked with the patient prioritizing her involvement in working to get her insurance restored, going to social services to apply for disability, and being consistent with her med therapy. The patient spoke about her willingness to shop for more affordable insurance.   Suicidal/Homicidal: Nowithout intent/plan   Therapist Response:The OPT therapist worked with the patient for the patients scheduled session. The patient was engaged in her session and gave feedback in relation to triggers, symptoms, and behavior responses over the past few weeks. The OPT therapist worked with the patient utilizing an in session Cognitive Behavioral Therapy exercise. The patient was responsive in the session and verbalized, " I can't get medicaid because I am married and my husbands income is to high and it disqualifies me". The OPT therapist worked with the patient to challenge her negative thoughts and empower her to continue to work with her health providers in working her health problems and getting to desired outcomes and getting her health insurance worked back out after the new year so that she can have coverage for her medical visits. The OPT therapist worked with the patient on importance of utilizing coping skills and consistency with the recommendations ongoing of her physical health providers. The  patient spoke about her stressors around finanaces and agreed to get refocused and go to the Social Services office to apply for Disability. The patient spoke about her functioning level and the intensity of her MH  symptoms and the OPT therapist ask for the patient to be seen at a hospital as she is not functioning. The patient noted, " I refuse that is not going to  happen". The patient did agree to talk with her PCP and get a referral for a psychiatrist to work on her med management to get to a place where her symptoms could be better managed . The patient did not verbalize S/I or H/I. The OPT therapist will continue treatment work with the patient in her next scheduled session   Plan: Return again in 2/3 weeks.   Diagnosis:      Axis I: Recurrent Moderate Major Depressive Disorder with Anxiety. Panic Disorder    Axis II: No diagnosis      Collaboration of Care: No additional collaboration of care for this session.   Patient/Guardian was advised Release of Information must be obtained prior to any record release in order to collaborate their care with an outside provider. Patient/Guardian was advised if they have not already done so to contact the registration department to sign all necessary forms in order for [name] to release information regarding their care.    Consent: Patient/Guardian gives verbal consent for treatment and assignment of benefits for services provided during this visit. Patient/Guardian expressed understanding and agreed to proceed.    I discussed the assessment and treatment plan with the patient. The patient was provided an opportunity to ask questions and all were answered. The patient agreed with the plan and demonstrated an understanding of the instructions.   The patient was advised to call back or seek an in-person evaluation if the symptoms worsen or if the condition fails to improve as anticipated.   I provided 30 minutes of non-face-to-face time during this encounter.   Lennox Grumbles, LCSW   11/06/2022

## 2022-11-24 ENCOUNTER — Other Ambulatory Visit: Payer: Self-pay | Admitting: *Deleted

## 2022-11-24 DIAGNOSIS — G43109 Migraine with aura, not intractable, without status migrainosus: Secondary | ICD-10-CM

## 2022-11-24 DIAGNOSIS — R0683 Snoring: Secondary | ICD-10-CM

## 2022-11-24 DIAGNOSIS — G4700 Insomnia, unspecified: Secondary | ICD-10-CM

## 2022-11-30 ENCOUNTER — Other Ambulatory Visit (HOSPITAL_COMMUNITY): Payer: Self-pay

## 2022-12-01 DIAGNOSIS — K219 Gastro-esophageal reflux disease without esophagitis: Secondary | ICD-10-CM | POA: Insufficient documentation

## 2022-12-07 ENCOUNTER — Encounter (INDEPENDENT_AMBULATORY_CARE_PROVIDER_SITE_OTHER): Payer: Self-pay | Admitting: *Deleted

## 2022-12-08 ENCOUNTER — Telehealth: Payer: Self-pay

## 2022-12-08 ENCOUNTER — Telehealth: Payer: Self-pay | Admitting: *Deleted

## 2022-12-08 NOTE — Telephone Encounter (Signed)
Please call patient botox has not been delivered, it appears she needs a new Prior Auth, PA team will start working on this. Please call patient and let her know.

## 2022-12-08 NOTE — Telephone Encounter (Signed)
Needs Botox auth.  Chronic Migraine CPT 64615  Botox J0585 Units:200  G43.109 Chronic Migraine without aura, intractable, with status migrainous     Please route replies to POD 1.

## 2022-12-08 NOTE — Telephone Encounter (Signed)
Patient is asking about her botox - asking about if it has been delivered to the office or not. I relayed the only information I have is that she is scheduled on 01/18/23 for her botox appt to which the patient said she was unaware. Requesting call back from nurse to confirm botox and answer questions.

## 2022-12-09 ENCOUNTER — Other Ambulatory Visit (HOSPITAL_COMMUNITY): Payer: Self-pay

## 2022-12-09 NOTE — Telephone Encounter (Signed)
Called pt. Informed her that a Prior Auth was needed for Botox. Pt asked if we could contact her once authorization was approved. Pt thank me for returning her call.

## 2022-12-09 NOTE — Telephone Encounter (Signed)
Pharmacy Patient Advocate Encounter   Received notification from [facility] that prior authorization for Botox 200 units is required/requested.  PA submitted on 12/09/2022 to (ins) [employer] via CoverMyMeds Key or (Medicaid) confirmation # [id number]  Status is pending     Benefit Verification [id number] Submitted! BOTOX ONE

## 2022-12-10 ENCOUNTER — Encounter: Payer: Self-pay | Admitting: Family Medicine

## 2022-12-10 NOTE — Telephone Encounter (Signed)
Pharmacy Patient Advocate Encounter  Received notification from [employer] that the request for prior authorization for Botox 200 units has been denied due to see below.       Please be advised we currently do not have a Pharmacist to review denials, therefore you will need to process appeals accordingly as needed. Thanks for your support at this time.  There is an online appeal option on CMM as well using the key: [id number]

## 2022-12-10 NOTE — Telephone Encounter (Signed)
Pt called stating that her BOTOX was denied per a letter that she received today. Pt would like to know if an appeal has been started. Please advise .

## 2022-12-10 NOTE — Telephone Encounter (Signed)
Kimberly Ortega, pt called stating botox denied. I see PA that was pending from yesterday. Do you have any updates on this?

## 2022-12-10 NOTE — Telephone Encounter (Signed)
Kimberly Ortega, I do not see where pt has tried either Aimovig or Ajovy. How would you like to proceed?

## 2022-12-10 NOTE — Telephone Encounter (Signed)
Pt called again stating that she called her insurance company and started the appeals process. They denied the BOTOX due to needing  to find out if she had tried two other medication before the BOTOX. Pt states that they are needing for the provider to send to them why she needs the BOTOX. Please leave a detailed message if she does not pick up or please mychart her.

## 2022-12-14 ENCOUNTER — Encounter: Payer: Self-pay | Admitting: Neurology

## 2022-12-14 ENCOUNTER — Ambulatory Visit (INDEPENDENT_AMBULATORY_CARE_PROVIDER_SITE_OTHER): Payer: 59 | Admitting: Neurology

## 2022-12-14 VITALS — BP 146/89 | HR 67 | Ht 67.0 in | Wt 241.0 lb

## 2022-12-14 DIAGNOSIS — F515 Nightmare disorder: Secondary | ICD-10-CM | POA: Diagnosis not present

## 2022-12-14 DIAGNOSIS — G43109 Migraine with aura, not intractable, without status migrainosus: Secondary | ICD-10-CM | POA: Diagnosis not present

## 2022-12-14 DIAGNOSIS — R0683 Snoring: Secondary | ICD-10-CM | POA: Diagnosis not present

## 2022-12-14 DIAGNOSIS — M5416 Radiculopathy, lumbar region: Secondary | ICD-10-CM

## 2022-12-14 DIAGNOSIS — F5104 Psychophysiologic insomnia: Secondary | ICD-10-CM | POA: Insufficient documentation

## 2022-12-14 NOTE — Patient Instructions (Signed)
Insomnia Insomnia is a sleep disorder that makes it difficult to fall asleep or stay asleep. Insomnia can cause fatigue, low energy, difficulty concentrating, mood swings, and poor performance at work or school. There are three different ways to classify insomnia: Difficulty falling asleep. Difficulty staying asleep. Waking up too early in the morning. Any type of insomnia can be long-term (chronic) or short-term (acute). Both are common. Short-term insomnia usually lasts for 3 months or less. Chronic insomnia occurs at least three times a week for longer than 3 months. What are the causes? Insomnia may be caused by another condition, situation, or substance, such as: Having certain mental health conditions, such as anxiety and depression. Using caffeine, alcohol, tobacco, or drugs. Having gastrointestinal conditions, such as gastroesophageal reflux disease (GERD). Having certain medical conditions. These include: Asthma. Alzheimer's disease. Stroke. Chronic pain. An overactive thyroid gland (hyperthyroidism). Other sleep disorders, such as restless legs syndrome and sleep apnea. Menopause. Sometimes, the cause of insomnia may not be known. What increases the risk? Risk factors for insomnia include: Gender. Females are affected more often than males. Age. Insomnia is more common as people get older. Stress and certain medical and mental health conditions. Lack of exercise. Having an irregular work schedule. This may include working night shifts and traveling between different time zones. What are the signs or symptoms? If you have insomnia, the main symptom is having trouble falling asleep or having trouble staying asleep. This may lead to other symptoms, such as: Feeling tired or having low energy. Feeling nervous about going to sleep. Not feeling rested in the morning. Having trouble concentrating. Feeling irritable, anxious, or depressed. How is this diagnosed? This condition  may be diagnosed based on: Your symptoms and medical history. Your health care provider may ask about: Your sleep habits. Any medical conditions you have. Your mental health. A physical exam. How is this treated? Treatment for insomnia depends on the cause. Treatment may focus on treating an underlying condition that is causing the insomnia. Treatment may also include: Medicines to help you sleep. Counseling or therapy. Lifestyle adjustments to help you sleep better. Follow these instructions at home: Eating and drinking  Limit or avoid alcohol, caffeinated beverages, and products that contain nicotine and tobacco, especially close to bedtime. These can disrupt your sleep. Do not eat a large meal or eat spicy foods right before bedtime. This can lead to digestive discomfort that can make it hard for you to sleep. Sleep habits  Keep a sleep diary to help you and your health care provider figure out what could be causing your insomnia. Write down: When you sleep. When you wake up during the night. How well you sleep and how rested you feel the next day. Any side effects of medicines you are taking. What you eat and drink. Make your bedroom a dark, comfortable place where it is easy to fall asleep. Put up shades or blackout curtains to block light from outside. Use a white noise machine to block noise. Keep the temperature cool. Limit screen use before bedtime. This includes: Not watching TV. Not using your smartphone, tablet, or computer. Stick to a routine that includes going to bed and waking up at the same times every day and night. This can help you fall asleep faster. Consider making a quiet activity, such as reading, part of your nighttime routine. Try to avoid taking naps during the day so that you sleep better at night. Get out of bed if you are still awake after   15 minutes of trying to sleep. Keep the lights down, but try reading or doing a quiet activity. When you feel  sleepy, go back to bed. General instructions Take over-the-counter and prescription medicines only as told by your health care provider. Exercise regularly as told by your health care provider. However, avoid exercising in the hours right before bedtime. Use relaxation techniques to manage stress. Ask your health care provider to suggest some techniques that may work well for you. These may include: Breathing exercises. Routines to release muscle tension. Visualizing peaceful scenes. Make sure that you drive carefully. Do not drive if you feel very sleepy. Keep all follow-up visits. This is important. Contact a health care provider if: You are tired throughout the day. You have trouble in your daily routine due to sleepiness. You continue to have sleep problems, or your sleep problems get worse. Get help right away if: You have thoughts about hurting yourself or someone else. Get help right away if you feel like you may hurt yourself or others, or have thoughts about taking your own life. Go to your nearest emergency room or: Call 911. Call the National Suicide Prevention Lifeline at 1-800-273-8255 or 988. This is open 24 hours a day. Text the Crisis Text Line at 741741. Summary Insomnia is a sleep disorder that makes it difficult to fall asleep or stay asleep. Insomnia can be long-term (chronic) or short-term (acute). Treatment for insomnia depends on the cause. Treatment may focus on treating an underlying condition that is causing the insomnia. Keep a sleep diary to help you and your health care provider figure out what could be causing your insomnia. This information is not intended to replace advice given to you by your health care provider. Make sure you discuss any questions you have with your health care provider. Document Revised: 08/18/2021 Document Reviewed: 08/18/2021 Elsevier Patient Education  2023 Elsevier Inc.  

## 2022-12-14 NOTE — Progress Notes (Signed)
SLEEP MEDICINE CLINIC    Provider:  Larey Seat, MD  Primary Care Physician:  Glenda Chroman, MD [place] [address]     Referring Provider: Debbora Presto, Np [address] [place],  [place] [address]   Primary Neurologist: Dr Leta Baptist.          Chief Complaint according to patient   Patient presents with:     New Patient (Initial Visit)     Referred by Amy, NP for a sleep consult-She c/o a SS > 5 yrs ago which was not significant for apnea and didn't need CPAP. More recently, She now has noticed snoring in sleep. She has been nodding off while watching TV. She has also noted weight gain. She states she still has problems with sleeping through the night. Avg 4-6 hours of fragmented sleep . She takes alprazolam for anxiety dx but in combination has tried/failed belsomra.  She states that she has intense dreams. Feels a disabling lack of energy.       HISTORY OF PRESENT ILLNESS:  Kimberly Ortega is a 57 y.o. female patient who is seen upon referral on 12/14/2022 from Amy Lomax/NP - Dr Leta Baptist  for a Sleep Consult.  The patient has back pain now for a year- and gained weight, is physically inactive, snores more. She is suffering chronic insomnia. She is suffering from anxiety and panic disorder.  Patient has been seen by Dr. Tish Frederickson 03-20-2020 for new onset headache and again 08-28-2022 for a sciatica back pain, now further worked up by Dr. Zada Finders.    I have the pleasure of seeing Kimberly Ortega 12/14/22 a right -handed female with a possible sleep disorder.    The patient had the first sleep study in the year 2012  with a result of an AHI ( Apnea Hypopnea index)  of less than 10/h,.    Sleep relevant medical history: Nocturia- 1 time, night mares - moaning , groaning in her sleep- dreaming at the very onset of sleep- Obesity, chronic Migraine, snoring , hypnopompic hallucination, mind is racing, planned back surgery for sciatica soon. Weight loss on  optavia in 2019-2020    Family medical /sleep history: mother with OSA.    Social history: Raised by a single mother, with one sibling.  Married , one adopted child, a  62 year old daughter- Patient was working as Cabin crew,  medically retired- and lives in a household with spouse, and cat.  Tobacco use:  none  ETOH use : seldomly ,  Caffeine intake in form of Coffee( /) Soda( seldom) Tea ( /) or energy drinks NO regular Exercise.    Sleep habits are as follows: The patient's dinner time is between 6-8 PM. The patient goes to bed at various times- she stays up until she feels sleepy enough- anxiety driven- TV is on -  meditation and white noise don't help.  The preferred sleep position is laterally, with the support of one thin/ medium soft pillows.  Dreams are reportedly very frequent/vivid/ nightmarish   The patient wakes up spontaneously with the sun rise- or just before, 5-6  AM is the usual rise time. She reports not feeling refreshed or restored in AM, with symptoms such as dry mouth, morning headaches, and residual fatigue.  Naps are taken infrequently, I just can't nap- but the last 8 weeks she had tried to nap, woke from " real " dreams and snoring.  Sleep paralysis.  Review of Systems: Out of a complete 14 system review, the patient complains of only the following symptoms, and all other reviewed systems are negative.:  Fatigue, sleepiness , snoring, fragmented sleep, Insomnia, RLS, Nocturia.   How likely are you to doze in the following situations: 0 = not likely, 1 = slight chance, 2 = moderate chance, 3 = high chance   Sitting and Reading? Watching Television? Sitting inactive in a public place (theater or meeting)? As a passenger in a car for an hour without a break? Lying down in the afternoon when circumstances permit? Sitting and talking to someone? Sitting quietly after lunch without alcohol? In a car, while stopped for a few minutes in traffic?    Total = 2/ 24 points   FSS endorsed at 58/ 63 points.     Social History   Socioeconomic History   Marital status: Married    Spouse name: Not on file   Number of children: 1   Years of education: Not on file   Highest education level: Some college, no degree  Occupational History    Comment: na  Tobacco Use   Smoking status: Never   Smokeless tobacco: Never  Substance and Sexual Activity   Alcohol use: Yes    Comment: wine/liquor maybe once a month   Drug use: No   Sexual activity: Yes    Birth control/protection: I.U.D.  Other Topics Concern   Not on file  Social History Narrative   Lives with spouse   Caffeine- none   Social Determinants of Health   Financial Resource Strain: Not on file  Food Insecurity: Not on file  Transportation Needs: Not on file  Physical Activity: Not on file  Stress: Not on file  Social Connections: Not on file    Family History  Problem Relation Age of Onset   Cervical cancer Mother    Stroke Mother    Aneurysm Mother    Cervical cancer Sister    Breast cancer Cousin        Both sides of family    Past Medical History:  Diagnosis Date   Abdominal pain    Allergies    Anemia    Anxiety    Arthritis    Complication of anesthesia    dizziness; almost passed out   Essential hypertension    Family history of breast cancer    Cousins on both sides of family   GERD (gastroesophageal reflux disease) 03/01/2017   History of melanoma    History of recurrent UTIs    Migraine    Multiple nevi    Obstructive sleep apnea    Panic disorder 1995   Polycystic ovarian disease     Past Surgical History:  Procedure Laterality Date   BIOPSY  04/09/2017   Procedure: BIOPSY;  Surgeon: Rogene Houston, MD;  Location: [facility];  Service: Endoscopy;;  gastric esophageal    CHOLECYSTECTOMY  1991   COLONOSCOPY  2016   DILATION AND CURETTAGE OF UTERUS     ESOPHAGOGASTRODUODENOSCOPY N/A 04/09/2017   Procedure:  ESOPHAGOGASTRODUODENOSCOPY (EGD);  Surgeon: Rogene Houston, MD;  Location: [facility];  Service: Endoscopy;  Laterality: N/A;  9:25   LAPAROSCOPIC GASTRIC BANDING  07/07/11   LAPAROSCOPIC REPAIR AND REMOVAL OF GASTRIC BAND     UPPER GI ENDOSCOPY N/A 11/21/2012   Procedure: UPPER GI ENDOSCOPY Ileene Hutchinson DIAGNOSTIC LAPAROSCOPY /REMOVAL LAP GASTRIC BAND/INSERTION OF DRAIN;  Surgeon: Pedro Earls, MD;  Location: [facility];  Service:  General;  Laterality: N/A;     Current Outpatient Medications on File Prior to Visit  Medication Sig Dispense Refill   acetaminophen (TYLENOL) 500 MG tablet Take 1,000 mg by mouth every 6 (six) hours as needed.     ALPRAZolam (XANAX) 1 MG tablet Take 1 mg by mouth 3 (three) times daily as needed for anxiety.      ascorbic acid (VITAMIN C) 500 MG tablet Take 500 mg by mouth daily.     BELSOMRA 5 MG TABS Take 1 tablet by mouth at bedtime.     Biotin w/ Vitamins C & E (HAIR/SKIN/NAILS PO) Take 3 each by mouth daily. Chewable     botulinum toxin Type A (BOTOX) 200 units injection FOR OFFICE ADMINISTRATION. PHYSICIAN TO INJECT 155 UNITS INTRAMUSCULARLY INTO MUSCLES OF HEAD & NECK EVERY 12 WEEKS.  DISCARD REMAINDER. 1 each 3   Calcium Carbonate-Vitamin D (CALCIUM PLUS VITAMIN D PO) Take 2 each by mouth daily. Chewables     dexlansoprazole (DEXILANT) 60 MG capsule TAKE 1 CAPSULE(60 MG) BY MOUTH DAILY 90 capsule 3   famotidine (PEPCID) 20 MG tablet Take 1 tablet (20 mg total) by mouth at bedtime as needed for heartburn or indigestion.     lamoTRIgine (LAMICTAL) 100 MG tablet Take 100 mg by mouth 2 (two) times daily. (Patient not taking: Reported on 10/22/2022)     metFORMIN (GLUCOPHAGE) 1000 MG tablet Take 1,000 mg by mouth daily.     MONUROL 3 g PACK Take 3 g by mouth daily as needed. Urinary tract infection  0   MULTIPLE VITAMIN PO Take 1 tablet by mouth daily.     Multiple Vitamins-Minerals (MULTIVITAMIN GUMMIES ADULT PO) Take 2 each by mouth daily.     ondansetron  (ZOFRAN-ODT) 4 MG disintegrating tablet DISSOLVE 1 TABLET(4 MG) ON THE TONGUE EVERY 8 HOURS AS NEEDED FOR NAUSEA OR VOMITING 20 tablet 0   OZEMPIC, 0.25 OR 0.5 MG/DOSE, 2 MG/3ML SOPN Inject 1 mg into the skin once a week.     Probiotic Product (ALIGN) CHEW Chew 1 capsule by mouth daily.     Rimegepant Sulfate (NURTEC) 75 MG TBDP Take 75 mg by mouth daily as needed (take for abortive therapy of migraine, no more than 1 tablet in 24 hours or 10 per month). 8 tablet 11   rizatriptan (MAXALT-MLT) 10 MG disintegrating tablet TAKE 1 TABLET BY MOUTH AS NEEDED FOR MIGRAINE. MAY REPEAT IN 2 HOURS AS NEEDED 12 tablet 11   Vilazodone HCl (VIIBRYD) 10 MG TABS Take 10 mg by mouth daily.     No current facility-administered medications on file prior to visit.    Allergies  Allergen Reactions   Ibuprofen Shortness Of Breath and Other (See Comments)    Lung specialist told her not to take    Macrobid [Nitrofurantoin Macrocrystal] Other (See Comments)    Lung specialist Told it would kill her  SOB   Baclofen Other (See Comments)   Morphine And Related Nausea Only    dizziness   Topiramate Other (See Comments)    Cognitive side effects.   Tramadol     Low blood pressure   Other Palpitations    PSEUDO COUGH DECONGESTANT     DIAGNOSTIC DATA (LABS, IMAGING, TESTING) - I reviewed patient records, labs, notes, testing and imaging myself where available.  Lab Results  Component Value Date   WBC 8.8 07/16/2020   HGB 13.6 07/16/2020   HCT 40.8 07/16/2020   MCV 87.7 07/16/2020   PLT  299 07/16/2020      Component Value Date/Time   NA 140 07/16/2020 1339   K 4.5 07/16/2020 1339   CL 104 07/16/2020 1339   CO2 30 07/16/2020 1339   GLUCOSE 79 07/16/2020 1339   BUN 12 07/16/2020 1339   CREATININE 0.86 07/16/2020 1339   CALCIUM 9.5 07/16/2020 1339   PROT 7.0 07/16/2020 1339   ALBUMIN 4.1 05/12/2017 0936   AST 15 07/16/2020 1339   ALT 14 07/16/2020 1339   ALKPHOS 65 05/12/2017 0936   BILITOT 0.4  07/16/2020 1339   GFRNONAA 70 10/04/2017 1328   GFRAA 81 10/04/2017 1328   No results found for: "CHOL", "HDL", "LDLCALC", "LDLDIRECT", "TRIG", "CHOLHDL" Lab Results  Component Value Date   HGBA1C 5.0 10/04/2017   No results found for: "VITAMINB12" Lab Results  Component Value Date   TSH 3.68 07/16/2020    PHYSICAL EXAM:  There were no vitals filed for this visit. There is no height or weight on file to calculate BMI.   Wt Readings from Last 3 Encounters:  08/25/22 222 lb 9.6 oz (101 kg)  07/27/22 218 lb 8 oz (99.1 kg)  03/19/22 205 lb 8 oz (93.2 kg)     Ht Readings from Last 3 Encounters:  08/25/22 5\' 7"  (1.702 m)  07/27/22 5\' 7"  (1.702 m)  03/19/22 5\' 7"  (1.702 m)      General: The patient is awake, alert and appears not in acute distress. The patient is well groomed. Head: Normocephalic, atraumatic. Neck is supple.  Mallampati 3 plus ,  neck circumference:18 inches .  Nasal airflow  patent.  Retrognathia is not seen.  Dental status: biological  Cardiovascular:  Regular rate and cardiac rhythm by pulse,  without distended neck veins. Respiratory: Lungs are clear to auscultation.  Skin:  Without evidence of ankle edema, or rash. Trunk: Obese   NEUROLOGIC EXAM: The patient is awake and alert, oriented to place and time.   Memory subjective described as intact.  Attention span & concentration ability appears normal.  Speech is fluent,  without dysarthria, dysphonia or aphasia.  Mood and affect are tearful, anxious.    Cranial nerves: no loss of smell or taste reported  Pupils are equal and briskly reactive to light. Funduscopic exam deferred..  Extraocular movements in vertical and horizontal planes were intact and without nystagmus. No Diplopia. Visual fields by finger perimetry are intact. Hearing was intact to soft voice and finger rubbing.    Facial sensation intact to fine touch.  Facial motor strength is symmetric and tongue and uvula move midline.  Neck  ROM : rotation, tilt and flexion extension were normal for age and shoulder shrug was symmetrical.    Motor exam:  Symmetric bulk, tone and ROM.   Normal tone without cog wheeling, symmetric grip strength .   Sensory:  Fine touch und  vibration were tested  and  normal.  Proprioception tested in the upper extremities was normal.   Coordination: Rapid alternating movements in the fingers/hands were of normal speed.  The Finger-to-nose maneuver was intact without evidence of ataxia, dysmetria or tremor.   Gait and station: deferred - see sciatica.  Deep tendon reflexes: in the  upper and lower extremities are symmetric and intact.      ASSESSMENT AND PLAN 57 y.o. year- old tearful  female  here with:    1) anxiety, panic disorder - long standing, chronic insomnia  (not related to organic causes) . She needs a psychiatric evaluation. I also  feel that her vivid dreams and night mares are a manifestation- she is not NARCOLEPTIC.   Headaches are chronic migraines- responding to Botox with Amy Lomax.   2) snoring and  high BMI justify screening for OSA, the patient feels safer in a sleep lab, may actually sleep p better in a LAB. I like to order an in lab study.   3)  recent additional problem of chronic back pain, onset 1 year ago, affecting sleep.     I plan to follow up through primary neurologist or our NP within 3-4 months, following a HST or PSG. .   I would like to thank Glenda Chroman, MD , Andrey Spearman, MD, and [place], [place], [place],  [place] [address] for allowing me to meet with and to take care of this pleasant patient.   After spending a total time of  45  minutes face to face and additional time for physical and neurologic examination, review of laboratory studies,  personal review of imaging studies, reports and results of other testing and review of referral information / records as far as provided in visit,   Electronically signed by: Larey Seat, MD 12/14/2022 8:27 AM  [facility] Board certified by The [employer] of Sleep Medicine and Diplomate of the [employer] of Sleep Medicine. Board certified In Neurology through the [place], Fellow of the [employer] of Neurology. Medical Director of [facility].

## 2022-12-14 NOTE — Telephone Encounter (Addendum)
Submitted urgent appeal for Botox on covermymeds. Key: [id number]. Received the following response: "Unable to create case due to Internal Error. Please contact [employer] at [phone] to create a new prior authorization request."  I called this phone number. Spoke w/ Hailey. They do see appeal pending on their end. This was initiated by the pt. She transferred me to rep that can handle appeal. Spoke w/ Bre. She just needed further clinical questions answered on appeal pt started. I answered this. Explained: " She has already tried/failed Emgality which is in the CGRP drug class. Aimovig/Ajovy are in the same drug class. Emgality did not work well at helping manage migraines. Pt has been on Botox since 07/2022. She is tolerating well and stable on therapy. Changing medication will put her at risk for worsening of migraines". Asked for appeal to be reviewed urgently. Should have decision within 3 days.

## 2022-12-15 ENCOUNTER — Other Ambulatory Visit: Payer: Self-pay | Admitting: *Deleted

## 2022-12-15 ENCOUNTER — Ambulatory Visit (INDEPENDENT_AMBULATORY_CARE_PROVIDER_SITE_OTHER): Payer: 59 | Admitting: Clinical

## 2022-12-15 DIAGNOSIS — F410 Panic disorder [episodic paroxysmal anxiety] without agoraphobia: Secondary | ICD-10-CM

## 2022-12-15 DIAGNOSIS — F331 Major depressive disorder, recurrent, moderate: Secondary | ICD-10-CM | POA: Diagnosis not present

## 2022-12-15 DIAGNOSIS — F419 Anxiety disorder, unspecified: Secondary | ICD-10-CM

## 2022-12-15 DIAGNOSIS — G43109 Migraine with aura, not intractable, without status migrainosus: Secondary | ICD-10-CM

## 2022-12-15 MED ORDER — UBRELVY 100 MG PO TABS: 1.0000 | ORAL_TABLET | ORAL | 0 refills | Status: DC | PRN

## 2022-12-15 NOTE — Telephone Encounter (Signed)
Called [facility], rep states that they need to review order for pt but as long as nothing has changed this will be a quick review. Tentatively scheduled delivery for 12/23/22, they will reach out to [name] if any changes are needed.

## 2022-12-15 NOTE — Progress Notes (Signed)
Virtual Visit via Video Note   I connected with Kimberly Ortega on 12/15/22 at  3:00 PM EST by a video enabled telemedicine application and verified that I am speaking with the correct person using two identifiers.   Location: Patient: Home Provider: Office   I discussed the limitations of evaluation and management by telemedicine and the availability of in person appointments. The patient expressed understanding and agreed to proceed.   THERAPIST PROGRESS NOTE     Session Time: 3:00 PM-3:45 PM   Participation Level: Active   Behavioral Response: Casual and Alert,Depressed   Type of Therapy: Individual Therapy   Treatment Goals addressed: Depression and Anxiety   Interventions: CBT   Summary: Kimberly Ortega is a 57 y.o. female who presents with Depression with Anxiety and Panic Disorder. The OPT therapist worked with the patient for her ongoing OPT treatment. The OPT therapist utilized Motivational Interviewing to assist in creating therapeutic repore. The patient in the session was engaged and work in collaboration giving feedback about her triggers and symptoms over the past few weeks. The patient spoke about her ongoing health struggles with migraines,sciatica, pain in throat and sleep. The patient spoke about learning she needs back surgery. The OPT therapist utilized Cognitive Behavioral Therapy through cognitive restructuring as well as worked with the patient on coping strategies to assist in management of mood. The OPT therapist overviewed in session with the patient basic need areas examining the patients current eating habits, sleep schedule, exercise, and hygiene..The patient noted she has been doing botox and that has been helping with her headaches.   Suicidal/Homicidal: Nowithout intent/plan   Therapist Response:The OPT therapist worked with the patient for the patients scheduled session. The patient was engaged in her session and gave feedback in relation to triggers, symptoms,  and behavior responses over the past few weeks. The OPT therapist worked with the patient utilizing an in session Cognitive Behavioral Therapy exercise. The patient was responsive in the session and verbalized, " I am worried because I am finding answers for my health problems but my deductible has not been met so there is a large out of pocket cost and I had to stop some of my medications on my own because I cannot pay for them". The OPT therapist worked with the patient to challenge her negative thoughts and empower her to continue to work with her health providers in working her health problems and getting to desired outcomes. The OPT therapist worked with the patient on importance of utilizing coping skills and consistency with the recommendations ongoing of her physical health providers. The patient spoke about hoping she is able to get the approval from the insurance for the sleep study she needs to be covered.The patient is also looking in June to do a Endoscopy due to stomach pain/ acid reflux. The patient notes she has also went since her last session to her Gynecologist and had blood work completed with concern as she is "I am still having a monthly period and at my age that is not normal".The patient did not verbalize any current S/I or H/I. The OPT therapist worked with the patient on change that she has power to continue including change of environment.The OPT therapist advised the patient should she feel she is in crisis to go for evaluation for inpatient and she verbalized she understood.The OPT therapist will continue treatment work with the patient in her next scheduled session   Plan: Return again in 2/3 weeks.   Diagnosis:  Axis I: Recurrent Moderate Major Depressive Disorder with Anxiety. Panic Disorder    Axis II: No diagnosis      Collaboration of Care: No additional collaboration of care for this session.   Patient/Guardian was advised Release of Information must be obtained  prior to any record release in order to collaborate their care with an outside provider. Patient/Guardian was advised if they have not already done so to contact the registration department to sign all necessary forms in order for [name] to release information regarding their care.    Consent: Patient/Guardian gives verbal consent for treatment and assignment of benefits for services provided during this visit. Patient/Guardian expressed understanding and agreed to proceed.    I discussed the assessment and treatment plan with the patient. The patient was provided an opportunity to ask questions and all were answered. The patient agreed with the plan and demonstrated an understanding of the instructions.   The patient was advised to call back or seek an in-person evaluation if the symptoms worsen or if the condition fails to improve as anticipated.   I provided 45 minutes of non-face-to-face time during this encounter.   Lennox Grumbles, LCSW   12/15/2022

## 2022-12-15 NOTE — Telephone Encounter (Signed)
Jillian/Angel- can you please let pt know Botox approved and see if you can schedule shipment for botox scheduled on 01/18/23? Rx already sent to [facility].

## 2022-12-15 NOTE — Telephone Encounter (Signed)
Candice from [employer] [phone]) called to report approval for Botox. Approved thru 03-17-23

## 2022-12-16 ENCOUNTER — Ambulatory Visit (HOSPITAL_COMMUNITY): Payer: 59 | Admitting: Clinical

## 2022-12-17 ENCOUNTER — Telehealth: Payer: Self-pay | Admitting: Neurology

## 2022-12-17 NOTE — Telephone Encounter (Signed)
[place] pending uploaded notes on the portal.  HST- [employer] no auth req.

## 2022-12-21 NOTE — Telephone Encounter (Signed)
Checked status on the portal it is still pending.  

## 2022-12-23 NOTE — Telephone Encounter (Signed)
Checked status on the portal it is still pending.  

## 2022-12-28 ENCOUNTER — Other Ambulatory Visit: Payer: Self-pay

## 2022-12-28 ENCOUNTER — Other Ambulatory Visit: Payer: Self-pay | Admitting: *Deleted

## 2022-12-28 DIAGNOSIS — G43109 Migraine with aura, not intractable, without status migrainosus: Secondary | ICD-10-CM

## 2022-12-28 MED ORDER — UBRELVY 100 MG PO TABS: 1.0000 | ORAL_TABLET | ORAL | 5 refills | Status: DC | PRN

## 2022-12-28 NOTE — Telephone Encounter (Signed)
[employer] Denied the Split study below is the denial reasoning. Sent patient mychart message to the patient.

## 2022-12-30 ENCOUNTER — Ambulatory Visit: Payer: 59 | Admitting: Neurology

## 2022-12-30 DIAGNOSIS — R0683 Snoring: Secondary | ICD-10-CM

## 2022-12-30 DIAGNOSIS — F5104 Psychophysiologic insomnia: Secondary | ICD-10-CM

## 2022-12-30 DIAGNOSIS — G4733 Obstructive sleep apnea (adult) (pediatric): Secondary | ICD-10-CM | POA: Diagnosis not present

## 2022-12-30 DIAGNOSIS — G43109 Migraine with aura, not intractable, without status migrainosus: Secondary | ICD-10-CM

## 2022-12-30 DIAGNOSIS — M5416 Radiculopathy, lumbar region: Secondary | ICD-10-CM

## 2022-12-30 DIAGNOSIS — F515 Nightmare disorder: Secondary | ICD-10-CM

## 2022-12-31 ENCOUNTER — Ambulatory Visit (INDEPENDENT_AMBULATORY_CARE_PROVIDER_SITE_OTHER): Payer: 59 | Admitting: Gastroenterology

## 2022-12-31 ENCOUNTER — Encounter: Payer: Self-pay | Admitting: *Deleted

## 2022-12-31 ENCOUNTER — Encounter: Payer: Self-pay | Admitting: Gastroenterology

## 2022-12-31 ENCOUNTER — Telehealth: Payer: Self-pay | Admitting: *Deleted

## 2022-12-31 VITALS — BP 130/85 | HR 93 | Temp 98.2°F | Ht 67.0 in | Wt 240.4 lb

## 2022-12-31 DIAGNOSIS — K219 Gastro-esophageal reflux disease without esophagitis: Secondary | ICD-10-CM

## 2022-12-31 DIAGNOSIS — R1030 Lower abdominal pain, unspecified: Secondary | ICD-10-CM | POA: Diagnosis not present

## 2022-12-31 DIAGNOSIS — K649 Unspecified hemorrhoids: Secondary | ICD-10-CM

## 2022-12-31 DIAGNOSIS — R1084 Generalized abdominal pain: Secondary | ICD-10-CM | POA: Diagnosis not present

## 2022-12-31 NOTE — Telephone Encounter (Signed)
[employer] PA:  CPT Code GEEGD Description: Esophagogastroduodenoscopy Case Number: [id number] Review Date: 12/31/2022 4:38:01 PM Expiration Date: N/A Status: This member is not in scope for prior-authorization/notification for the services requested. You can save the case reference ID as validation of your request.

## 2022-12-31 NOTE — Progress Notes (Addendum)
GI Office Note    Referring Provider: Ignatius Specking, MD Primary Care Physician:  Ignatius Specking, MD  Primary Gastroenterologist: Dolores Frame, MD   Chief Complaint   Chief Complaint  Patient presents with   Gastroesophageal Reflux   Abdominal Pain    Multiple locations   History of Present Illness   Kimberly Ortega is a 57 y.o. female presenting today at the request of Vyas, Dhruv B, MD for GERD and abdominal pain.   EGD July 2010: -Procedure report unavailable -Biopsy negative for H. pylori.   EGD July 2018: -Normal esophagus s/p biopsy -Large amount of food residue in the stomach with patent pylorus suggestive of gastroparesis -Gastritis s/p biopsy -Normal duodenum -Advised metoclopramide 10 mg 3 times daily before meals  Colonoscopy in December 2021 with [facility]: Report unavailable in Care Everywhere.  Dr. Dareen Piano.   Pelvic ultrasound 12/10/2022: -Study limited particularly in the endovaginal imaging, however felt as though IUD is in the endometrial canal -X-ray of the pelvis to confirm presence of IUD and pelvis. -Left ovary not visualized -Limited evaluation of right ovary and uterus is otherwise unremarkable  Today: Having pains in the evenings in the epigastrium and other different areas in her lower abdomen. In her chest down into her stomach. Feels like there are bumps or sores or something in her throat.   Has tried protonix, omeprazole, nexium, lansoprazole, aciphex.   Went to see ENT by recommendation by PCP to have evaluation of her throat area. States they told her this should be done to screen for cancer (Barrett's). Has intermittent shortness of breath. Has had a full cardiac workup other than heart cath.   She reports about 10 years or so ago she had lap band placed and had erosion and had to remove that and had clamps placed. She is concerned if this is affecting the upper pain or not. At night she has to take Pepcid nightly and she takes  name brand Dexilant in the mornings. Reports she has gained weight from not being active like she used to be due to need for back surgery. States she used to walk multiple miles per day. She has chest pains with her panic disorder daily,sometimes lasts longer than others.   States with her lap band she was never feeling full and was having fluid added to her band and she knew something was wrong and turns out there was an issue with it.   Pain that occurs in her abdomen is 9/10 at times.   Will take gas ex at times and within seconds to minutes she will have improvement if she has gas pains.   Reports she avoids spicy foods, fatty, and fried foods. Tries to avoid the typical triggers. Avoids coffee. Does not remember her last alcoholic drink. Avoids NSAIDs (possible respiratory complications).   No changes in bowel habits. Since pandemic she has gained 60lbs or more due to inactively. No constipation. No melena or brbpr. States she knows what her typical pains are and when she is having a panic attack.   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     ALPRAZolam (XANAX) 1 MG tablet Take 1 mg by mouth in the morning, at noon, and at bedtime.  ascorbic acid (VITAMIN C) 500 MG tablet Take 500 mg by mouth daily.     BELSOMRA 10 MG TABS Take 1 tablet by mouth at bedtime as needed.     Biotin w/ Vitamins C & E (HAIR/SKIN/NAILS PO) Take 3 each by mouth daily. Chewable     botulinum toxin Type A (BOTOX) 200 units injection FOR OFFICE ADMINISTRATION. PHYSICIAN TO INJECT 155 UNITS INTRAMUSCULARLY INTO MUSCLES OF HEAD & NECK EVERY 12 WEEKS.  DISCARD REMAINDER. 1 each 3   dexlansoprazole (DEXILANT) 60 MG capsule TAKE 1 CAPSULE(60 MG) BY MOUTH DAILY 90 capsule 3    famotidine (PEPCID) 20 MG tablet Take 1 tablet (20 mg total) by mouth at bedtime as needed for heartburn or indigestion.     metFORMIN (GLUCOPHAGE) 1000 MG tablet Take 1,000 mg by mouth daily.     MONUROL 3 g PACK Take 3 g by mouth daily as needed. Urinary tract infection  0   MULTIPLE VITAMIN PO Take 1 tablet by mouth daily.     nystatin (MYCOSTATIN/NYSTOP) powder Apply topically.     nystatin cream (MYCOSTATIN) Apply topically.     ondansetron (ZOFRAN-ODT) 4 MG disintegrating tablet DISSOLVE 1 TABLET(4 MG) ON THE TONGUE EVERY 8 HOURS AS NEEDED FOR NAUSEA OR VOMITING 20 tablet 0   Probiotic Product (ALIGN) CHEW Chew 1 capsule by mouth daily.     rizatriptan (MAXALT-MLT) 10 MG disintegrating tablet TAKE 1 TABLET BY MOUTH AS NEEDED FOR MIGRAINE. MAY REPEAT IN 2 HOURS AS NEEDED 12 tablet 11   Ubrogepant (UBRELVY) 100 MG TABS Take 1 tablet (100 mg total) by mouth as needed. 10 tablet 5   OZEMPIC, 0.25 OR 0.5 MG/DOSE, 2 MG/3ML SOPN Inject 1 mg into the skin once a week. (Patient not taking: Reported on 12/14/2022)     No current facility-administered medications for this visit.   Past Medical History:  Diagnosis Date   Abdominal pain    Allergies    Anemia    Anxiety    Arthritis    Complication of anesthesia    dizziness; almost passed out   Essential hypertension    Family history of breast cancer    Cousins on both sides of family   GERD (gastroesophageal reflux disease) 03/01/2017   History of melanoma    History of recurrent UTIs    Migraine    Multiple nevi    Obstructive sleep apnea    Panic disorder 1995   Polycystic ovarian disease     Past Surgical History:  Procedure Laterality Date   BIOPSY  04/09/2017   Procedure: BIOPSY;  Surgeon: Malissa Hippo, MD;  Location: [facility];  Service: Endoscopy;;  gastric esophageal    CHOLECYSTECTOMY  1991   COLONOSCOPY  2016   DILATION AND CURETTAGE OF UTERUS     ESOPHAGOGASTRODUODENOSCOPY N/A 04/09/2017   Procedure:  ESOPHAGOGASTRODUODENOSCOPY (EGD);  Surgeon: Malissa Hippo, MD;  Location: [facility];  Service: Endoscopy;  Laterality: N/A;  9:25   LAPAROSCOPIC GASTRIC BANDING  07/07/11   LAPAROSCOPIC REPAIR AND REMOVAL OF GASTRIC BAND     UPPER GI ENDOSCOPY N/A 11/21/2012   Procedure: UPPER GI ENDOSCOPY [name] DIAGNOSTIC LAPAROSCOPY /REMOVAL LAP GASTRIC BAND/INSERTION OF DRAIN;  Surgeon: Valarie Merino, MD;  Location: [facility];  Service: General;  Laterality: N/A;    Family History  Problem Relation Age of Onset   Cervical cancer Mother    Stroke Mother    Aneurysm Mother    Cervical cancer Sister  Breast cancer Cousin        Both sides of family    Allergies as of 12/31/2022 - Review Complete 12/31/2022  Allergen Reaction Noted   Ibuprofen Shortness Of Breath and Other (See Comments) 04/06/2017   Macrobid [nitrofurantoin macrocrystal] Other (See Comments) 03/01/2017   Baclofen Other (See Comments) 05/13/2022   Morphine and related Nausea Only 11/21/2012   Topiramate Other (See Comments) 03/21/2020   Tramadol  03/11/2016   Other Palpitations 03/13/2020    Social History   Socioeconomic History   Marital status: Married    Spouse name: Not on file   Number of children: 1   Years of education: Not on file   Highest education level: Some college, no degree  Occupational History    Comment: na  Tobacco Use   Smoking status: Never   Smokeless tobacco: Never  Substance and Sexual Activity   Alcohol use: Yes    Comment: wine/liquor maybe once a month   Drug use: No   Sexual activity: Yes    Birth control/protection: I.U.D.  Other Topics Concern   Not on file  Social History Narrative   Lives with spouse   Caffeine- none   Social Determinants of Health   Financial Resource Strain: Not on file  Food Insecurity: Not on file  Transportation Needs: Not on file  Physical Activity: Not on file  Stress: Not on file  Social Connections: Not on file  Intimate Partner  Violence: Not on file     Review of Systems   Gen: Denies any fever, chills, fatigue, weight loss, lack of appetite.  CV: Denies chest pain, heart palpitations, peripheral edema, syncope.  Resp: Denies shortness of breath at rest or with exertion. Denies wheezing or cough.  GI: see HPI GU : Denies urinary burning, urinary frequency, urinary hesitancy MS: Denies joint pain, muscle weakness, cramps, or limitation of movement.  Derm: Denies rash, itching, dry skin Psych: + anxiety. Denies depression, memory loss, and confusion Heme: Denies bruising, bleeding, and enlarged lymph nodes.   Physical Exam   BP 130/85 (BP Location: Right Arm, Patient Position: Sitting, Cuff Size: Large)   Pulse 93   Temp 98.2 F (36.8 C) (Oral)   Ht 5\' 7"  (1.702 m)   Wt 240 lb 6.4 oz (109 kg)   LMP  (LMP Unknown)   SpO2 97%   BMI 37.65 kg/m   General:   Alert and oriented. Pleasant and cooperative. Well-nourished and well-developed.  Head:  Normocephalic and atraumatic. Eyes:  Without icterus, sclera clear and conjunctiva pink.  Ears:  Normal auditory acuity. Mouth:  No deformity or lesions, oral mucosa pink.  Lungs:  Clear to auscultation bilaterally. No wheezes, rales, or rhonchi. No distress.  Heart:  S1, S2 present without murmurs appreciated.  Abdomen:  +BS, soft, non-distended. Ttp to epigastrium, LUQ, RLQ, and LLQ. No HSM noted. No guarding or rebound. No masses appreciated.  Rectal:  Deferred  Msk:  Symmetrical without gross deformities. Normal posture. Extremities:  Without edema. Neurologic:  Alert and  oriented x4;  grossly normal neurologically. Skin:  Intact without significant lesions or rashes. Psych:  Alert and cooperative. Normal mood and affect.   Assessment   Kimberly Ortega is a 57 y.o. female with a history of GERD, gastroparesis, left-sided abdominal pain, recurrent UTIs, anxiety, OSA, PCOS, HTN presenting today with abdominal pain  GERD, gastroparesis: No current nausea or  vomiting.  GERD not exactly well-controlled with Dexilant 60 mg once daily.  Also taking Pepcid  nightly.  Continues to have intermittent epigastric discomfort as well as a sensation in her throat and into her chest described as feeling like there are bumps or sores present.  Has seen ENT who recommended EGD.  Last EGD in July 2018 with presence of gastritis and food residue in the stomach suggesting gastroparesis.  She was started on Reglan 3 times daily but not currently taking this.  Has history of lap band procedure and subsequent removal given complication.  She has been avoiding typical reflux triggers including spicy foods, fatty foods, fried foods, and coffee.  No NSAID use and very rare alcohol use.  Given patient's complaints we will schedule an upper endoscopy for further evaluation.  No complaints of dysphagia.  Differentials include esophagitis, gastritis, duodenitis, stricture, erosions, H. pylori, etc.  Advised to stop Dexilant and trial of Voquezna given she has tried and failed omeprazole, pantoprazole, Nexium, lansoprazole, and Aciphex in the past.  Samples given today.  Will restart Dexilant if samples not helpful.  Abdominal pain: Has epigastric discomfort as well as intermittent sharp left lower and right lower quadrant abdominal pain mostly.  Occasionally will have periumbilical pain.  She states when her pain is most severe to be a 9 out of 10.  At times also has gas pains but knows the difference between this and her typical pain.  Gas pain is typically relieved with Gas-X.  She reports her pain has been worsening over the last few months.  Denies any constipation, diarrhea, melena, BRBPR, or unintentional weight loss.  Recently unremarkable pelvic ultrasound.  Given her ongoing pain and tenderness on exam today we will proceed with a CT of the abdomen and pelvis.  PLAN   Proceed with upper endoscopy with propofol by Dr. Levon Hedgerastaneda in near future: the risks, benefits, and alternatives  have been discussed with the patient in detail. The patient states understanding and desires to proceed. ASA 3 (prefer April if able) Hold metformin morning of procedure Hold Ozempic for 1 week CT A/P (okay to travel to [place] or [place]) Stop Dexilant. Trial of Voquezna. Provide samples. Will send prescription if helpful Resume Dexilant if Voquezna not helpful.  Due for colonoscopy 2026.  Follow up in 2 months.    Brooke Bonitoourtney Tova Vater, MSN, FNP-BC, AGACNP-BC [facility]  I have reviewed the note and agree with the APP's assessment as described in this progress note  Katrinka Blazinganiel Castaneda, MD Gastroenterology and Hepatology [facility]

## 2022-12-31 NOTE — Patient Instructions (Addendum)
We are scheduling you for an upper endoscopy in the near future with Dr. Levon Hedger.  For now stop taking her Dexilant and trial Voquezna.  If NOT HELPFUL YOU MAY RESUME YOUR DEXILANT AND PEPCID NIGHTLY.  If it is helpful please let me know we will send in prescription.  Follow a GERD diet:  Avoid fried, fatty, greasy, spicy, citrus foods. Avoid caffeine and carbonated beverages. Avoid chocolate. Try eating 4-6 small meals a day rather than 3 large meals. Do not eat within 3 hours of laying down. Prop head of bed up on wood or bricks to create a 6 inch incline.  We will get you scheduled for a CT of your abdomen and pelvis to assess your abdominal pain.  If you decide you would like to proceed with hemorrhoid banding, call the office to make an appointment in the interim.  We will have you follow-up in 2-3 months, sooner if needed.  It was a pleasure to see you today. I want to create trusting relationships with patients. If you receive a survey regarding your visit,  I greatly appreciate you taking time to fill this out on paper or through your MyChart. I value your feedback.  [name], MSN, FNP-BC, AGACNP-BC [facility]

## 2023-01-01 ENCOUNTER — Encounter: Payer: Self-pay | Admitting: *Deleted

## 2023-01-04 ENCOUNTER — Ambulatory Visit (HOSPITAL_BASED_OUTPATIENT_CLINIC_OR_DEPARTMENT_OTHER)
Admission: RE | Admit: 2023-01-04 | Discharge: 2023-01-04 | Disposition: A | Discharge status: Home / Self Care | Payer: 59 | Source: Ambulatory Visit | Attending: Gastroenterology | Admitting: Gastroenterology

## 2023-01-04 DIAGNOSIS — R1030 Lower abdominal pain, unspecified: Secondary | ICD-10-CM | POA: Diagnosis present

## 2023-01-04 MED ORDER — IOHEXOL 300 MG/ML  SOLN
100.0000 mL | Freq: Once | INTRAMUSCULAR | Status: AC | PRN
  Administered 2023-01-04: 100 mL via INTRAVENOUS

## 2023-01-04 NOTE — Telephone Encounter (Signed)
error 

## 2023-01-06 ENCOUNTER — Telehealth (INDEPENDENT_AMBULATORY_CARE_PROVIDER_SITE_OTHER): Payer: Self-pay | Admitting: Gastroenterology

## 2023-01-06 NOTE — Telephone Encounter (Signed)
Pt left voicemail stating that she needing to cancel procedure for 01/19/23 due to having back surgery next week.  Contacted pt and rescheduled her to 02/02/23. Updated instructions will be mailed and sent via my chart (per pt request).   Message sent to endo letting them know.

## 2023-01-06 NOTE — Progress Notes (Signed)
[facility]   MRN: [id number]    HOME SLEEP TEST REPORT ( by Watch PAT)   STUDY DATA:  01-06-2022    ORDERING CLINICIAN: Melvyn Novas, MD  REFERRING CLINICIAN: Dr Delight Stare, NP    CLINICAL INFORMATION/HISTORY: Kimberly Ortega is a 57 y.o. female patient who is seen upon referral on 12/14/2022 from Amy Lomax/NP - Dr Marjory Lies  for a Sleep Consult.  The patient has back pain now for a year- and gained weight, is physically inactive, snores more. Night mares - moaning , groaning in her sleep- dreaming at the very onset of sleep- Obesity, chronic Migraine, snoring , hypnopompic hallucination, mind is racing, planned back surgery for sciatica soon.  Weight loss on optavia in 2019-2020  She is suffering from chronic insomnia. She is suffering from anxiety and panic disorder.  Patient has been seen by Dr. Danae Orleans 03-20-2020 for new onset headache and again 08-28-2022 for a sciatica back pain, now further worked up by Dr. Maurice Small.     Epworth sleepiness score: 2 /24.  FSS at 58/ 63 points    BMI:35 kg/m   Neck Circumference: 18"   FINDINGS:   Sleep Summary:   Total Recording Time (hours, min): 7 hours and 37 minutes       Total Sleep Time (hours, min):    7 hours and 3 minutes             Percent REM (%):   32%                                     Respiratory Indices:   Calculated pAHI (per hour):   21.7/h                          REM pAHI: 50.4/h                                               NREM pAHI:    8.3/h                          Positional AHI:   Sleep was recorded exclusively in supine position.                                                Oxygen Saturation Statistics:    O2 Saturation Range (%): From a nadir at 81% with a maximum saturation at 99% with a mean saturation of 94%                                     O2 Saturation (minutes) <89%: 5 minutes         Pulse Rate Statistics:   Pulse Mean (bpm):   82 bpm               Pulse Range:    Between 64 and 99 bpm.             IMPRESSION:  This  HST confirms the presence of moderate obstructive sleep apnea with no central apneas recorded.  This is a very strong REM sleep dependent form of sleep apnea and repeat will respond only to positive airway pressure therapy.  There were many brief oxygen desaturation events -these are all clustered in REM sleep.   RECOMMENDATION: Positive airway pressure is the only therapy that immediately corrects a REM sleep dependent form of sleep apnea.  Dental devices and hypoglossal stimulation devices do not work for this form of apnea. This patient also will need to start CPAP urgently with an autotitration capable device, with heated humidification, interface of choice, settings between 5 and 15 cmH2O pressure with 2 cm EPR.    INTERPRETING PHYSICIAN:   Melvyn Novas, MD    [facility] is a facility accredited by the [employer].

## 2023-01-07 ENCOUNTER — Telehealth: Payer: Self-pay | Admitting: *Deleted

## 2023-01-07 NOTE — Telephone Encounter (Signed)
Initiated PA Ubrelvy on covermymeds. Key: [id number]. In process of completing.

## 2023-01-11 ENCOUNTER — Encounter: Payer: Self-pay | Admitting: Neurology

## 2023-01-11 NOTE — Progress Notes (Signed)
This patient is only 2 days away from back surgery and will need to start therapy with a CPAP autotitration device ASAP. Data were loaded 01-07-2023.

## 2023-01-11 NOTE — Procedures (Signed)
[facility]   MRN: [id number]    HOME SLEEP TEST REPORT ( by Watch PAT)   STUDY DATA:  01-06-2022    ORDERING CLINICIAN: Melvyn Novas, MD  REFERRING CLINICIAN: Dr Delight Stare, NP    CLINICAL INFORMATION/HISTORY: Kimberly Ortega is a 56 y.o. female patient who is seen upon referral on 12/14/2022 from Amy Lomax/NP - Dr Marjory Lies  for a Sleep Consult.  The patient has back pain now for a year- and gained weight, is physically inactive, snores more. Night mares - moaning , groaning in her sleep- dreaming at the very onset of sleep- Obesity, chronic Migraine, snoring , hypnopompic hallucination, mind is racing, planned back surgery for sciatica soon.  Weight loss on optavia in 2019-2020  She is suffering from chronic insomnia. She is suffering from anxiety and panic disorder.  Patient has been seen by Dr. Danae Orleans 03-20-2020 for new onset headache and again 08-28-2022 for a sciatica back pain, now further worked up by Dr. Maurice Small.     Epworth sleepiness score: 2 /24.  FSS at 58/ 63 points    BMI:35 kg/m   Neck Circumference: 18"   FINDINGS:   Sleep Summary:   Total Recording Time (hours, min): 7 hours and 37 minutes       Total Sleep Time (hours, min):    7 hours and 3 minutes             Percent REM (%):   32%                                     Respiratory Indices:   Calculated pAHI (per hour):   21.7/h                          REM pAHI: 50.4/h                                               NREM pAHI:    8.3/h                          Positional AHI:   Sleep was recorded exclusively in supine position.                                                Oxygen Saturation Statistics:    O2 Saturation Range (%): From a nadir at 81% with a maximum saturation at 99% with a mean saturation of 94%                                     O2 Saturation (minutes) <89%: 5 minutes         Pulse Rate Statistics:   Pulse Mean (bpm):   82 bpm               Pulse Range:    Between 64 and 99 bpm.             IMPRESSION:  This HST confirms the presence of moderate obstructive sleep  apnea with no central apneas recorded.  This is a very strong REM sleep dependent form of sleep apnea and repeat will respond only to positive airway pressure therapy.  There were many brief oxygen desaturation events -these are all clustered in REM sleep.   RECOMMENDATION: Positive airway pressure is the only therapy that immediately corrects a REM sleep dependent form of sleep apnea.  Dental devices and hypoglossal stimulation devices do not work for this form of apnea. This patient also will need to start CPAP urgently with an autotitration capable device, with heated humidification, interface of choice, settings between 5 and 15 cmH2O pressure with 2 cm EPR.    INTERPRETING PHYSICIAN:   Melvyn Novas, MD    [facility] is a facility accredited by the [employer].

## 2023-01-12 ENCOUNTER — Other Ambulatory Visit: Payer: Self-pay | Admitting: Family Medicine

## 2023-01-12 ENCOUNTER — Other Ambulatory Visit: Payer: Self-pay | Admitting: Neurological Surgery

## 2023-01-12 DIAGNOSIS — G4733 Obstructive sleep apnea (adult) (pediatric): Secondary | ICD-10-CM

## 2023-01-12 NOTE — Telephone Encounter (Signed)
Called the patient to review the information with her and answered her questions. She is agreeable to the getting set up CPAP. I have faxed the updated results to Dr Cherre Huger.I will send the orders for CPAP for the patient to [facility]. Provided her their phone number. Pt will b

## 2023-01-13 NOTE — Telephone Encounter (Signed)
Pt stated she needs a nurse to expedite her order at [facility]. Stated her surgery was cancelled because she dosen't have CPAP machine yet.

## 2023-01-14 ENCOUNTER — Ambulatory Visit: Payer: 59 | Admitting: Family Medicine

## 2023-01-14 ENCOUNTER — Ambulatory Visit (HOSPITAL_COMMUNITY): Admission: RE | Admit: 2023-01-14 | Payer: 59 | Source: Home / Self Care | Admitting: Neurological Surgery

## 2023-01-14 ENCOUNTER — Encounter (HOSPITAL_COMMUNITY): Admission: RE | Payer: Self-pay | Source: Home / Self Care

## 2023-01-14 ENCOUNTER — Encounter (HOSPITAL_COMMUNITY): Payer: 59

## 2023-01-14 SURGERY — LUMBAR LAMINECTOMY/ DECOMPRESSION WITH MET-RX
Anesthesia: General | Laterality: Right

## 2023-01-14 NOTE — Progress Notes (Signed)
01/18/23 ALL: Kimberly Ortega returns for Botox. She reports doing better. Averaging about 6 migraine days a month. Kimberly Ortega does seem to help. She is averaging 3-4 doses per month. Rarely using rizatriptan.   10/22/2022 ALL: Kimberly Ortega returns for Botox.She reports migraines have improved by at least 50%. Nurtec did not seem much more effective than rizatriptan.   07/27/2022 ALL: Kimberly Ortega presents for her first Botox procedure. Baseline 15 migraine days per month. She was previously taking amitriptyline 20mg  at bedtime and propranolol LA 60mg  daily. She discontinued both propranolol and amitriptyline a few weeks ago. She continues rizatriptan as needed.     Consent Form Botulism Toxin Injection For Chronic Migraine    Reviewed orally with patient, additionally signature is on file:  Botulism toxin has been approved by the Federal drug administration for treatment of chronic migraine. Botulism toxin does not cure chronic migraine and it may not be effective in some patients.  The administration of botulism toxin is accomplished by injecting a small amount of toxin into the muscles of the neck and head. Dosage must be titrated for each individual. Any benefits resulting from botulism toxin tend to wear off after 3 months with a repeat injection required if benefit is to be maintained. Injections are usually done every 3-4 months with maximum effect peak achieved by about 2 or 3 weeks. Botulism toxin is expensive and you should be sure of what costs you will incur resulting from the injection.  The side effects of botulism toxin use for chronic migraine may include:   -Transient, and usually mild, facial weakness with facial injections  -Transient, and usually mild, head or neck weakness with head/neck injections  -Reduction or loss of forehead facial animation due to forehead muscle weakness  -Eyelid drooping  -Dry eye  -Pain at the site of injection or bruising at the site of injection  -Double  vision  -Potential unknown long term risks   Contraindications: You should not have Botox if you are pregnant, nursing, allergic to albumin, have an infection, skin condition, or muscle weakness at the site of the injection, or have myasthenia gravis, Lambert-Eaton syndrome, or ALS.  It is also possible that as with any injection, there may be an allergic reaction or no effect from the medication. Reduced effectiveness after repeated injections is sometimes seen and rarely infection at the injection site may occur. All care will be taken to prevent these side effects. If therapy is given over a long time, atrophy and wasting in the muscle injected may occur. Occasionally the patient's become refractory to treatment because they develop antibodies to the toxin. In this event, therapy needs to be modified.  I have read the above information and consent to the administration of botulism toxin.    BOTOX PROCEDURE NOTE FOR MIGRAINE HEADACHE  Contraindications and precautions discussed with patient(above). Aseptic procedure was observed and patient tolerated procedure. Procedure performed by Shawnie Dapper, FNP-C.   The condition has existed for more than 6 months, and pt does not have a diagnosis of ALS, Myasthenia Gravis or Lambert-Eaton Syndrome.  Risks and benefits of injections discussed and pt agrees to proceed with the procedure.  Written consent obtained  These injections are medically necessary. Pt  receives good benefits from these injections. These injections do not cause sedations or hallucinations which the oral therapies may cause.   Description of procedure:  The patient was placed in a sitting position. The standard protocol was used for Botox as follows, with 5 units of  Botox injected at each site:  -Procerus muscle, midline injection  -Corrugator muscle, bilateral injection  -Frontalis muscle, bilateral injection, with 2 sites each side, medial injection was performed in the upper  one third of the frontalis muscle, in the region vertical from the medial inferior edge of the superior orbital rim. The lateral injection was again in the upper one third of the forehead vertically above the lateral limbus of the cornea, 1.5 cm lateral to the medial injection site.  -Temporalis muscle injection, 4 sites, bilaterally. The first injection was 3 cm above the tragus of the ear, second injection site was 1.5 cm to 3 cm up from the first injection site in line with the tragus of the ear. The third injection site was 1.5-3 cm forward between the first 2 injection sites. The fourth injection site was 1.5 cm posterior to the second injection site. 5th site laterally in the temporalis  muscleat the level of the outer canthus.  -Occipitalis muscle injection, 3 sites, bilaterally. The first injection was done one half way between the occipital protuberance and the tip of the mastoid process behind the ear. The second injection site was done lateral and superior to the first, 1 fingerbreadth from the first injection. The third injection site was 1 fingerbreadth superiorly and medially from the first injection site.  -Cervical paraspinal muscle injection, 2 sites, bilaterally. The first injection site was 1 cm from the midline of the cervical spine, 3 cm inferior to the lower border of the occipital protuberance. The second injection site was 1.5 cm superiorly and laterally to the first injection site.  -Trapezius muscle injection was performed at 3 sites, bilaterally. The first injection site was in the upper trapezius muscle halfway between the inflection point of the neck, and the acromion. The second injection site was one half way between the acromion and the first injection site. The third injection was done between the first injection site and the inflection point of the neck.   Will return for repeat injection in 3 months.   A total of 200 units of Botox was prepared, 155 units of Botox was  injected as documented above, any Botox not injected was wasted. The patient tolerated the procedure well, there were no complications of the above procedure.

## 2023-01-15 ENCOUNTER — Ambulatory Visit (HOSPITAL_COMMUNITY): Payer: 59 | Admitting: Clinical

## 2023-01-18 ENCOUNTER — Ambulatory Visit (INDEPENDENT_AMBULATORY_CARE_PROVIDER_SITE_OTHER): Payer: 59 | Admitting: Family Medicine

## 2023-01-18 DIAGNOSIS — G43109 Migraine with aura, not intractable, without status migrainosus: Secondary | ICD-10-CM | POA: Diagnosis not present

## 2023-01-18 MED ORDER — ONABOTULINUMTOXINA 200 UNITS IJ SOLR
155.0000 [IU] | Freq: Once | INTRAMUSCULAR | Status: AC
Start: 2023-01-18 — End: 2023-01-18
  Administered 2023-01-18: 155 [IU] via INTRAMUSCULAR

## 2023-01-18 NOTE — Progress Notes (Signed)
Botox- 200 units x 1 vial Lot: [id number] Expiration: 02/2025 NDC: 9811-9147-82  Bacteriostatic 0.9% Sodium Chloride- * mL  Lot: [id number] Expiration: 11/25 NDC: 86578-469-62  Dx: X52.841 S/P  Witnessed by Ted Mcalpine, RMA

## 2023-01-19 NOTE — Telephone Encounter (Signed)
Orders were sent to [facility] and they have to get insurance authorization first. As soon as [facility] gets the insurance auth, they will call to schedule the patient. Their number is [phone] and the patient will need to follow up with them.

## 2023-01-21 ENCOUNTER — Telehealth: Payer: Self-pay

## 2023-01-21 NOTE — Telephone Encounter (Signed)
Pt called to reschedule initial appt after starting CPAP - I let pt know that we do not schedule that appt here in the Sleep Lab but would make sure to send a message to Dr. Oliva Bustard pod so that the correct person could call her back and schedule. Pt also stated she woke up with a migraine and wasn't sure if this was normal or not. Please call pt back at [phone].

## 2023-01-21 NOTE — Telephone Encounter (Signed)
Called pt. She woke up with headache and currently has a migraine. Took a Arts development officer. She will see how this helps. She will make sure to drink plenty of fluids and rest. Advised unclear if headache could be related to CPAP. Hard to tell with one night of use. She will continue to monitor. If she feels this becomes a pattern, she will let our office know.   Advised her initial cpap follow up is 03/30/23 at 3:30pm with Dr. Vickey Huger. She did not have this written down. She states this date/time will work.

## 2023-01-27 NOTE — Patient Instructions (Signed)
20    Your procedure is scheduled on: 02/02/2023  Report to [facility] Main Entrance at  8:45   AM.  Call this number if you have problems the morning of surgery: [phone]   Remember:   Follow instructions on letter from office regarding when to stop eating and drinking        No Smoking the day of procedure      Take these medicines the morning of surgery with A SIP OF WATER: xanax, dexilant, Pepcid, maxalt, and/or ubrelvy   Do not wear jewelry, make-up or nail polish.  Do not wear lotions, powders, or perfumes. You may wear deodorant.                Do not bring valuables to the hospital.  Contacts, dentures or bridgework may not be worn into surgery.  Leave suitcase in the car. After surgery it may be brought to your room.  For patients admitted to the hospital, checkout time is 11:00 AM the day of discharge.   Patients discharged the day of surgery will not be allowed to drive home. Upper Endoscopy, Adult Upper endoscopy is a procedure to look inside the upper GI (gastrointestinal) tract. The upper GI tract is made up of: The part of the body that moves food from your mouth to your stomach (esophagus). The stomach. The first part of your small intestine (duodenum). This procedure is also called esophagogastroduodenoscopy (EGD) or gastroscopy. In this procedure, your health care provider passes a thin, flexible tube (endoscope) through your mouth and down your esophagus into your stomach. A small camera is attached to the end of the tube. Images from the camera appear on a monitor in the exam room. During this procedure, your health care provider may also remove a small piece of tissue to be sent to a lab and examined under a microscope (biopsy). Your health care provider may do an upper endoscopy to diagnose cancers of the upper GI tract. You may also have this procedure to find the cause of other conditions, such as: Stomach pain. Heartburn. Pain or problems when  swallowing. Nausea and vomiting. Stomach bleeding. Stomach ulcers. Tell a health care provider about: Any allergies you have. All medicines you are taking, including vitamins, herbs, eye drops, creams, and over-the-counter medicines. Any problems you or family members have had with anesthetic medicines. Any blood disorders you have. Any surgeries you have had. Any medical conditions you have. Whether you are pregnant or may be pregnant. What are the risks? Generally, this is a safe procedure. However, problems may occur, including: Infection. Bleeding. Allergic reactions to medicines. A tear or hole (perforation) in the esophagus, stomach, or duodenum. What happens before the procedure? Staying hydrated Follow instructions from your health care provider about hydration, which may include: Up to 4 hours before the procedure - you may continue to drink clear liquids, such as water, clear fruit juice, black coffee, and plain tea.   Medicines Ask your health care provider about: Changing or stopping your regular medicines. This is especially important if you are taking diabetes medicines or blood thinners. Taking medicines such as aspirin and ibuprofen. These medicines can thin your blood. Do not take these medicines unless your health care provider tells you to take them. Taking over-the-counter medicines, vitamins, herbs, and supplements. General instructions Plan to have someone take you home from the hospital or clinic. If you will be going home right after the procedure, plan to have someone with you for 24  hours. Ask your health care provider what steps will be taken to help prevent infection. What happens during the procedure?  An IV will be inserted into one of your veins. You may be given one or more of the following: A medicine to help you relax (sedative). A medicine to numb the throat (local anesthetic). You will lie on your left side on an exam table. Your health care  provider will pass the endoscope through your mouth and down your esophagus. Your health care provider will use the scope to check the inside of your esophagus, stomach, and duodenum. Biopsies may be taken. The endoscope will be removed. The procedure may vary among health care providers and hospitals. What happens after the procedure? Your blood pressure, heart rate, breathing rate, and blood oxygen level will be monitored until you leave the hospital or clinic. Do not drive for 24 hours if you were given a sedative during your procedure. When your throat is no longer numb, you may be given some fluids to drink. It is up to you to get the results of your procedure. Ask your health care provider, or the department that is doing the procedure, when your results will be ready. Summary Upper endoscopy is a procedure to look inside the upper GI tract. During the procedure, an IV will be inserted into one of your veins. You may be given a medicine to help you relax. A medicine will be used to numb your throat. The endoscope will be passed through your mouth and down your esophagus. This information is not intended to replace advice given to you by your health care provider. Make sure you discuss any questions you have with your health care provider. Document Revised: 03/02/2018 Document Reviewed: 02/07/2018 Elsevier Patient Education  [place] After  Please read the instructions outlined below and refer to this sheet in the next few weeks. These discharge instructions provide you with general information on caring for yourself after you leave the hospital. Your doctor may also give you specific instructions. While your treatment has been planned according to the most current medical practices available, unavoidable complications occasionally  occur. If you have any problems or questions after discharge, please call your doctor. HOME CARE INSTRUCTIONS Activity You may resume your regular activity but move at a slower pace for the next 24 hours.  Take frequent rest periods for the next 24 hours.  Walking will help expel (get rid of) the air and reduce the bloated feeling in your abdomen.  No driving for 24 hours (because of the anesthesia (medicine) used during the test).  You may shower.  Do not sign any important legal documents or operate any machinery for 24 hours (because of  the anesthesia used during the test).  Nutrition Drink plenty of fluids.  You may resume your normal diet.  Begin with a light meal and progress to your normal diet.  Avoid alcoholic beverages for 24 hours or as instructed by your caregiver.  Medications You may resume your normal medications unless your caregiver tells you otherwise. What you can expect today You may experience abdominal discomfort such as a feeling of fullness or "gas" pains.  You may experience a sore throat for 2 to 3 days. This is normal. Gargling with salt water may help this.  Follow-up Your doctor will discuss the results of your test with you. SEEK IMMEDIATE MEDICAL CARE IF: You have excessive nausea (feeling sick to your stomach) and/or vomiting.  You have severe abdominal pain and distention (swelling).  You have trouble swallowing.  You have a temperature over 100 F (37.8 C).  You have rectal bleeding or vomiting of blood.  Document Released: 04/21/2004 Document Revised: 08/27/2011 Document Reviewed: 11/02/2007

## 2023-01-28 ENCOUNTER — Other Ambulatory Visit: Payer: Self-pay

## 2023-01-28 ENCOUNTER — Emergency Department (HOSPITAL_COMMUNITY): Payer: 59

## 2023-01-28 ENCOUNTER — Encounter (HOSPITAL_COMMUNITY)
Admission: RE | Admit: 2023-01-28 | Discharge: 2023-01-28 | Disposition: A | Discharge status: Home / Self Care | Payer: 59 | Source: Ambulatory Visit | Attending: Gastroenterology | Admitting: Gastroenterology

## 2023-01-28 ENCOUNTER — Encounter (HOSPITAL_COMMUNITY): Payer: Self-pay | Admitting: Emergency Medicine

## 2023-01-28 ENCOUNTER — Emergency Department (HOSPITAL_COMMUNITY)
Admission: EM | Admit: 2023-01-28 | Discharge: 2023-01-28 | Disposition: A | Discharge status: Home / Self Care | Payer: 59 | Attending: Emergency Medicine | Admitting: Emergency Medicine

## 2023-01-28 VITALS — BP 139/86 | HR 68 | Temp 97.5°F | Resp 18 | Ht 67.0 in | Wt 240.3 lb

## 2023-01-28 DIAGNOSIS — E119 Type 2 diabetes mellitus without complications: Secondary | ICD-10-CM

## 2023-01-28 DIAGNOSIS — R072 Precordial pain: Secondary | ICD-10-CM | POA: Diagnosis not present

## 2023-01-28 DIAGNOSIS — R079 Chest pain, unspecified: Secondary | ICD-10-CM

## 2023-01-28 DIAGNOSIS — Z01818 Encounter for other preprocedural examination: Secondary | ICD-10-CM | POA: Insufficient documentation

## 2023-01-28 DIAGNOSIS — R9431 Abnormal electrocardiogram [ECG] [EKG]: Secondary | ICD-10-CM | POA: Insufficient documentation

## 2023-01-28 DIAGNOSIS — D649 Anemia, unspecified: Secondary | ICD-10-CM | POA: Insufficient documentation

## 2023-01-28 DIAGNOSIS — I10 Essential (primary) hypertension: Secondary | ICD-10-CM

## 2023-01-28 LAB — CBC WITH DIFFERENTIAL/PLATELET
Abs Immature Granulocytes: 0.06 10*3/uL (ref 0.00–0.07)
Basophils Absolute: 0.1 10*3/uL (ref 0.0–0.1)
Basophils Relative: 1 %
Eosinophils Absolute: 0.3 10*3/uL (ref 0.0–0.5)
Eosinophils Relative: 4 %
HCT: 40.1 % (ref 36.0–46.0)
Hemoglobin: 13.6 g/dL (ref 12.0–15.0)
Immature Granulocytes: 1 %
Lymphocytes Relative: 26 %
Lymphs Abs: 1.8 10*3/uL (ref 0.7–4.0)
MCH: 30 pg (ref 26.0–34.0)
MCHC: 33.9 g/dL (ref 30.0–36.0)
MCV: 88.3 fL (ref 80.0–100.0)
Monocytes Absolute: 0.4 10*3/uL (ref 0.1–1.0)
Monocytes Relative: 6 %
Neutro Abs: 4.4 10*3/uL (ref 1.7–7.7)
Neutrophils Relative %: 62 %
Platelets: 244 10*3/uL (ref 150–400)
RBC: 4.54 MIL/uL (ref 3.87–5.11)
RDW: 12.9 % (ref 11.5–15.5)
WBC: 7 10*3/uL (ref 4.0–10.5)
nRBC: 0 % (ref 0.0–0.2)

## 2023-01-28 LAB — BASIC METABOLIC PANEL WITH GFR
Anion gap: 7 (ref 5–15)
BUN: 15 mg/dL (ref 6–20)
CO2: 28 mmol/L (ref 22–32)
Calcium: 8.9 mg/dL (ref 8.9–10.3)
Chloride: 102 mmol/L (ref 98–111)
Creatinine, Ser: 0.94 mg/dL (ref 0.44–1.00)
GFR, Estimated: >60 mL/min
Glucose, Bld: 104 mg/dL — ABNORMAL HIGH (ref 70–99)
Potassium: 3.7 mmol/L (ref 3.5–5.1)
Sodium: 137 mmol/L (ref 135–145)

## 2023-01-28 LAB — CBC
HCT: 39.3 % (ref 36.0–46.0)
Hemoglobin: 13.3 g/dL (ref 12.0–15.0)
MCH: 29.7 pg (ref 26.0–34.0)
MCHC: 33.8 g/dL (ref 30.0–36.0)
MCV: 87.7 fL (ref 80.0–100.0)
Platelets: 263 10*3/uL (ref 150–400)
RBC: 4.48 MIL/uL (ref 3.87–5.11)
RDW: 13 % (ref 11.5–15.5)
WBC: 9.3 10*3/uL (ref 4.0–10.5)
nRBC: 0 % (ref 0.0–0.2)

## 2023-01-28 LAB — BASIC METABOLIC PANEL
Anion gap: 8 (ref 5–15)
BUN: 16 mg/dL (ref 6–20)
CO2: 27 mmol/L (ref 22–32)
Calcium: 8.9 mg/dL (ref 8.9–10.3)
Chloride: 102 mmol/L (ref 98–111)
Creatinine, Ser: 0.85 mg/dL (ref 0.44–1.00)
GFR, Estimated: >60 mL/min (ref >60)
Glucose, Bld: 91 mg/dL (ref 70–99)
Potassium: 3.7 mmol/L (ref 3.5–5.1)
Sodium: 137 mmol/L (ref 135–145)

## 2023-01-28 LAB — TROPONIN I (HIGH SENSITIVITY)
Troponin I (High Sensitivity): <2 ng/L (ref <18)
Troponin I (High Sensitivity): <2 ng/L (ref <18)

## 2023-01-28 LAB — BRAIN NATRIURETIC PEPTIDE: B Natriuretic Peptide: 20 pg/mL (ref 0.0–100.0)

## 2023-01-28 LAB — D-DIMER, QUANTITATIVE: D-Dimer, Quant: 0.32 ug/mL-FEU (ref 0.00–0.50)

## 2023-01-28 NOTE — Progress Notes (Addendum)
Kimberly Ortega after her PAT appointment and stated she was having chest pain.  I instructed her to come to the ED to be evaluated.  Her EKG this am was NSR.  She did state she just started using  a CPAP machine this week.

## 2023-01-28 NOTE — ED Provider Notes (Signed)
[place] [facility] AT [facility] Provider Note   CSN: [id number] Arrival date & time: 01/28/23  1300     History  Chief Complaint  Patient presents with   Chest Pain   HPI Kimberly Ortega is a 57 y.o. female with anxiety, panic disorder and GERD presenting for chest pain.  Started yesterday sitting down.  Also no associated shortness of breath.  Symptoms are nonexertional.  Also mention that she has had some tenderness in her left calf.  The chest pain is midsternal and radiates to the left upper chest.  It is intermittent and feels sharp.  It is nonpleuritic.  Denies cough congestion.  States in the last few months because of her sciatica she has been more immobile.  States he is gained 20 pounds in the last 2 months.  Also has an indwelling Mirena device.   Chest Pain      Home Medications Prior to Admission medications   Medication Sig Start Date End Date Taking? Authorizing Provider  acetaminophen (TYLENOL) 500 MG tablet Take 1,000 mg by mouth every 6 (six) hours as needed for moderate pain.    [provider]  ALPRAZolam Prudy Feeler) 1 MG tablet Take 1 mg by mouth in the morning, at noon, and at bedtime.    [provider]  ascorbic acid (VITAMIN C) 500 MG tablet Take 500 mg by mouth daily.    [provider]  BELSOMRA 10 MG TABS Take 10 mg by mouth at bedtime as needed (sleep).    [provider]  Biotin w/ Vitamins C & E (HAIR/SKIN/NAILS PO) Take 3 tablets by mouth daily.    [provider]  botulinum toxin Type A (BOTOX) 200 units injection FOR OFFICE ADMINISTRATION. PHYSICIAN TO INJECT 155 UNITS INTRAMUSCULARLY INTO MUSCLES OF HEAD & NECK EVERY 12 WEEKS.  DISCARD REMAINDER. 06/29/22   Lomax, Amy, NP  dexlansoprazole (DEXILANT) 60 MG capsule TAKE 1 CAPSULE(60 MG) BY MOUTH DAILY 12/29/21   Rehman, Joline Maxcy, MD  famotidine (PEPCID) 20 MG tablet Take 1 tablet (20 mg total) by mouth at bedtime as needed for heartburn or  indigestion. Patient taking differently: Take 20 mg by mouth at bedtime. 10/04/19   Malissa Hippo, MD  metFORMIN (GLUCOPHAGE) 1000 MG tablet Take 1,000 mg by mouth daily. 12/29/21   [provider]  MONUROL 3 g PACK Take 3 g by mouth daily as needed (UTI). 02/11/17   [provider]  MULTIPLE VITAMIN PO Take 1 tablet by mouth daily.    [provider]  NON FORMULARY Pt uses a cpap nighlty    [provider]  nystatin (MYCOSTATIN/NYSTOP) powder Apply 1 Application topically daily as needed (yeast). 12/01/22   [provider]  nystatin cream (MYCOSTATIN) Apply 1 Application topically daily as needed (yeast). 12/01/22   [provider]  ondansetron (ZOFRAN-ODT) 4 MG disintegrating tablet DISSOLVE 1 TABLET(4 MG) ON THE TONGUE EVERY 8 HOURS AS NEEDED FOR NAUSEA OR VOMITING 06/25/22   Lomax, Amy, NP  OZEMPIC, 1 MG/DOSE, 4 MG/3ML SOPN Inject 1 mg into the skin once a week.    [provider]  Probiotic Product (ALIGN) CHEW Chew 1 capsule by mouth daily.    [provider]  rizatriptan (MAXALT-MLT) 10 MG disintegrating tablet TAKE 1 TABLET BY MOUTH AS NEEDED FOR MIGRAINE. MAY REPEAT IN 2 HOURS AS NEEDED 10/22/22   Lomax, Amy, NP  Ubrogepant (UBRELVY) 100 MG TABS Take 1 tablet (100 mg total) by mouth as  needed. 12/28/22   Lomax, Amy, NP  VITAMIN A PO Take 1 tablet by mouth daily.    [provider]      Allergies    Ibuprofen, Macrobid [nitrofurantoin macrocrystal], Baclofen, Morphine and related, Topiramate, Tramadol, Other, and Pseudoephedrine    Review of Systems   Review of Systems  Cardiovascular:  Positive for chest pain.    Physical Exam   Vitals:   01/28/23 1315  BP: 136/89  Pulse: 75  Resp: 20  Temp: 98.3 F (36.8 C)  SpO2: 94%    CONSTITUTIONAL:  well-appearing, NAD NEURO:  Alert and oriented x 3, CN 3-12 grossly intact EYES:  eyes equal and reactive ENT/NECK:  Supple, no stridor  CARDIO:  regular rate  and rhythm, appears well-perfused  PULM:  No respiratory distress, CTAB GI/GU:  non-distended, soft MSK/SPINE:  No gross deformities, no edema, moves all extremities  SKIN:  no rash, atraumatic  *Additional and/or pertinent findings included in MDM below  ED Results / Procedures / Treatments   Labs (all labs ordered are listed, but only abnormal results are displayed) Labs Reviewed  BASIC METABOLIC PANEL - Abnormal; Notable for the following components:      Result Value   Glucose, Bld 104 (*)    All other components within normal limits  CBC  D-DIMER, QUANTITATIVE  BRAIN NATRIURETIC PEPTIDE  POC URINE PREG, ED  TROPONIN I (HIGH SENSITIVITY)  TROPONIN I (HIGH SENSITIVITY)    EKG EKG Interpretation  Date/Time:  Thursday Jan 28 2023 13:10:23 EDT Ventricular Rate:  83 PR Interval:  134 QRS Duration: 76 QT Interval:  372 QTC Calculation: 437 R Axis:   52 Text Interpretation: Normal sinus rhythm Normal ECG When compared with ECG of 28-Jan-2023 09:40, Criteria for Septal infarct are no longer Present Confirmed by Eber Hong ([id number]) on 01/28/2023 1:26:06 PM  Radiology DG Chest 2 View  Result Date: 01/28/2023 CLINICAL DATA:  Chest pain since yesterday in the left side EXAM: CHEST - 2 VIEW COMPARISON:  X-ray chest 07/20/2016 FINDINGS: No consolidation, pneumothorax or effusion. No edema. Normal cardiopericardial silhouette. Degenerative changes of the spine. Overlapping cardiac leads. IMPRESSION: No acute cardiopulmonary disease. Electronically Signed   By: Karen Kays M.D.   On: 01/28/2023 14:05    Procedures Procedures    Medications Ordered in ED Medications - No data to display  ED Course/ Medical Decision Making/ A&P Clinical Course as of 01/28/23 1656  Thu Jan 28, 2023  1325 ED EKG [BM]    Clinical Course User Index [BM] Eber Hong, MD                             Medical Decision Making Amount and/or Complexity of Data Reviewed Labs: ordered. Radiology:  ordered. ECG/medicine tests:  Decision-making details documented in ED Course.   Initial Impression and Ddx Presented on female presenting for chest pain.  Exam is unremarkable.  DDx includes ACS, PE, CHF exacerbation, COPD exacerbation, pneumonia, and dissection. Patient PMH that increases complexity of ED encounter:  none  Interpretation of Diagnostics I independent reviewed and interpreted the labs as followed: No acute derangements  - I independently visualized the following imaging with scope of interpretation limited to determining acute life threatening conditions related to emergency care: Chest x-ray, which revealed no acute cardiopulmonary processes  -I personally reviewed and interpreted EKG which revealed normal sinus rhythm -Echo in 2017 was found to be normal  Patient Reassessment and Ultimate  Disposition/Management Patient appears clinically well.  ACS workup was overall reassuring.  Heart score 2 also considered PE but unlikely given negative D-dimer and not hypoxic or tachycardic.  Symptoms inconsistent with dissection.  Discussed return precautions.  Advised patient to follow-up with her PCP.  Patient management required discussion with the following services or consulting groups:  None  Complexity of Problems Addressed Acute complicated illness or Injury  Additional Data Reviewed and Analyzed Further history obtained from: Past medical history and medications listed in the EMR and Prior ED visit notes  Patient Encounter Risk Assessment None         Final Clinical Impression(s) / ED Diagnoses Final diagnoses:  Chest pain, unspecified type    Rx / DC Orders ED Discharge Orders     None         Gareth Eagle, PA-C 01/28/23 1657    Eber Hong, MD 01/28/23 [phone]

## 2023-01-28 NOTE — Discharge Instructions (Signed)
Patient very chest pain today was overall reassuring.  EKG, serial cardiac enzymes and x-ray were all within normal limits.  If you have worsening chest pain, shortness of breath, tenderness in your calves or any other concerning symptom please return emerged part for further evaluation.  Otherwise recommend you follow-up with your PCP.

## 2023-01-28 NOTE — ED Triage Notes (Signed)
Pt via POV c/o left-sided nonradiating chest pain since yesterday. Pt has hx GERD, anxiety, and recently started using CPAP at night for OSA. Pt has also had SOB. Denies nausea, dizziness, diaphoresis.

## 2023-02-01 ENCOUNTER — Encounter: Payer: Self-pay | Admitting: Family Medicine

## 2023-02-01 ENCOUNTER — Other Ambulatory Visit: Payer: Self-pay | Admitting: Neurological Surgery

## 2023-02-01 NOTE — Telephone Encounter (Signed)
Started new case. Key: [id number]. Waiting on determination from [facility].

## 2023-02-02 ENCOUNTER — Ambulatory Visit (HOSPITAL_BASED_OUTPATIENT_CLINIC_OR_DEPARTMENT_OTHER): Payer: 59 | Admitting: Anesthesiology

## 2023-02-02 ENCOUNTER — Ambulatory Visit (HOSPITAL_COMMUNITY)
Admission: RE | Admit: 2023-02-02 | Discharge: 2023-02-02 | Disposition: A | Discharge status: Home / Self Care | Payer: 59 | Attending: Gastroenterology | Admitting: Gastroenterology

## 2023-02-02 ENCOUNTER — Ambulatory Visit (HOSPITAL_COMMUNITY): Payer: 59 | Admitting: Anesthesiology

## 2023-02-02 ENCOUNTER — Encounter (HOSPITAL_COMMUNITY)
Admission: RE | Disposition: A | Discharge status: Home / Self Care | Payer: Self-pay | Source: Home / Self Care | Attending: Gastroenterology

## 2023-02-02 ENCOUNTER — Other Ambulatory Visit: Payer: Self-pay

## 2023-02-02 ENCOUNTER — Encounter (HOSPITAL_COMMUNITY): Payer: Self-pay | Admitting: Gastroenterology

## 2023-02-02 DIAGNOSIS — G4733 Obstructive sleep apnea (adult) (pediatric): Secondary | ICD-10-CM

## 2023-02-02 DIAGNOSIS — Z79899 Other long term (current) drug therapy: Secondary | ICD-10-CM | POA: Insufficient documentation

## 2023-02-02 DIAGNOSIS — K449 Diaphragmatic hernia without obstruction or gangrene: Secondary | ICD-10-CM

## 2023-02-02 DIAGNOSIS — K2950 Unspecified chronic gastritis without bleeding: Secondary | ICD-10-CM | POA: Insufficient documentation

## 2023-02-02 DIAGNOSIS — Z9884 Bariatric surgery status: Secondary | ICD-10-CM

## 2023-02-02 DIAGNOSIS — K219 Gastro-esophageal reflux disease without esophagitis: Secondary | ICD-10-CM | POA: Diagnosis not present

## 2023-02-02 DIAGNOSIS — Z9989 Dependence on other enabling machines and devices: Secondary | ICD-10-CM | POA: Diagnosis not present

## 2023-02-02 DIAGNOSIS — Z800 Family history of malignant neoplasm of digestive organs: Secondary | ICD-10-CM | POA: Diagnosis not present

## 2023-02-02 DIAGNOSIS — I10 Essential (primary) hypertension: Secondary | ICD-10-CM | POA: Diagnosis not present

## 2023-02-02 DIAGNOSIS — R1013 Epigastric pain: Secondary | ICD-10-CM | POA: Diagnosis not present

## 2023-02-02 DIAGNOSIS — R1084 Generalized abdominal pain: Secondary | ICD-10-CM | POA: Diagnosis present

## 2023-02-02 HISTORY — PX: ESOPHAGOGASTRODUODENOSCOPY (EGD) WITH PROPOFOL: SHX5813

## 2023-02-02 HISTORY — PX: BIOPSY: SHX5522

## 2023-02-02 LAB — GLUCOSE, CAPILLARY: Glucose-Capillary: 101 mg/dL — ABNORMAL HIGH (ref 70–99)

## 2023-02-02 SURGERY — ESOPHAGOGASTRODUODENOSCOPY (EGD) WITH PROPOFOL
Anesthesia: General

## 2023-02-02 MED ORDER — MIDAZOLAM HCL 2 MG/2ML IJ SOLN
INTRAMUSCULAR | Status: DC | PRN
  Administered 2023-02-02: 2 mg via INTRAVENOUS

## 2023-02-02 MED ORDER — LIDOCAINE VISCOUS HCL 2 % MT SOLN
OROMUCOSAL | Status: AC
  Filled 2023-02-02: qty 15

## 2023-02-02 MED ORDER — LACTATED RINGERS IV SOLN: INTRAVENOUS | Status: DC

## 2023-02-02 MED ORDER — LIDOCAINE VISCOUS HCL 2 % MT SOLN
15.0000 mL | Freq: Once | OROMUCOSAL | Status: AC
  Administered 2023-02-02: 15 mL via OROMUCOSAL

## 2023-02-02 MED ORDER — PROPOFOL 10 MG/ML IV BOLUS
INTRAVENOUS | Status: DC | PRN
  Administered 2023-02-02: 100 mg via INTRAVENOUS

## 2023-02-02 MED ORDER — PROPOFOL 500 MG/50ML IV EMUL
INTRAVENOUS | Status: DC | PRN
  Administered 2023-02-02: 150 ug/kg/min via INTRAVENOUS

## 2023-02-02 MED ORDER — MIDAZOLAM HCL 2 MG/2ML IJ SOLN
INTRAMUSCULAR | Status: AC
  Filled 2023-02-02: qty 2

## 2023-02-02 MED ORDER — LIDOCAINE HCL (CARDIAC) PF 100 MG/5ML IV SOSY
PREFILLED_SYRINGE | INTRAVENOUS | Status: DC | PRN
  Administered 2023-02-02: 50 mg via INTRAVENOUS

## 2023-02-02 NOTE — Transfer of Care (Signed)
Immediate Anesthesia Transfer of Care Note  Patient: Kynsli Underdown  Procedure(s) Performed: ESOPHAGOGASTRODUODENOSCOPY (EGD) WITH PROPOFOL BIOPSY  Patient Location: Short Stay  Anesthesia Type:General  Level of Consciousness: awake  Airway & Oxygen Therapy: Patient Spontanous Breathing  Post-op Assessment: Report given to RN and Post -op Vital signs reviewed and stable  Post vital signs: Reviewed and stable  Last Vitals:  Vitals Value Taken Time  BP    Temp    Pulse    Resp    SpO2      Last Pain:  Vitals:   02/02/23 1053  TempSrc:   PainSc: 0-No pain         Complications: No notable events documented.

## 2023-02-02 NOTE — Op Note (Signed)
[facility] Patient Name: Kimberly Ortega Procedure Date: 02/02/2023 10:36 AM MRN: [id number] Date of Birth: [birth year] Attending MD: Katrinka Blazing , , [id number] CSN: [id number] Age: 57 Admit Type: Outpatient Procedure:                Upper GI endoscopy Indications:              Generalized abdominal pain, Gastro-esophageal                            reflux disease Providers:                Katrinka Blazing, Crystal Page, Volanda Napoleon., Technician Referring MD:              Medicines:                Monitored Anesthesia Care Complications:            No immediate complications. Estimated Blood Loss:     Estimated blood loss: none. Procedure:                Pre-Anesthesia Assessment:                           - Prior to the procedure, a History and Physical                            was performed, and patient medications, allergies                            and sensitivities were reviewed. The patient's                            tolerance of previous anesthesia was reviewed.                           - The risks and benefits of the procedure and the                            sedation options and risks were discussed with the                            patient. All questions were answered and informed                            consent was obtained.                           - ASA Grade Assessment: II - A patient with mild                            systemic disease.                           After obtaining informed consent, the endoscope was  passed under direct vision. Throughout the                            procedure, the patient's blood pressure, pulse, and                            oxygen saturations were monitored continuously. The                            [id number]-[id number] ([id number]) scope was introduced through the                            mouth, and advanced to the second part of duodenum.                             The upper GI endoscopy was accomplished without                            difficulty. The patient tolerated the procedure                            well. Scope In: 10:57:30 AM Scope Out: 11:03:45 AM Total Procedure Duration: 0 hours 6 minutes 15 seconds  Findings:      A 1 cm hiatal hernia was present.      The gastroesophageal flap valve was visualized endoscopically and       classified as Hill Grade II (fold present, opens with respiration).      The entire examined stomach was normal. Biopsies were taken with a cold       forceps for Helicobacter pylori testing.      The examined duodenum was normal. Biopsies were taken with a cold       forceps for histology. Impression:               - 1 cm hiatal hernia.                           - Normal stomach. Biopsied.                           - Normal examined duodenum. Biopsied. Moderate Sedation:      Per Anesthesia Care Recommendation:           - Discharge patient to home (ambulatory).                           - Resume previous diet.                           - Await pathology results.                           - Continue present medications.                           - Repeat upper endoscopy in 3 years for  surveillance. Procedure Code(s):        --- Professional ---                           [phone], Esophagogastroduodenoscopy, flexible,                            transoral; with biopsy, single or multiple Diagnosis Code(s):        --- Professional ---                           K44.9, Diaphragmatic hernia without obstruction or                            gangrene                           R10.84, Generalized abdominal pain                           K21.9, Gastro-esophageal reflux disease without                            esophagitis CPT copyright 2022 American Medical Association. All rights reserved. The codes documented in this report are preliminary and upon coder review may  be revised to  meet current compliance requirements. Katrinka Blazing, MD Katrinka Blazing,  02/02/2023 11:11:39 AM This report has been signed electronically. Number of Addenda: 0

## 2023-02-02 NOTE — H&P (Addendum)
Kimberly Ortega is an 57 y.o. female.   Chief Complaint: abdominal pain, sore throat HPI: Kimberly Ortega is a 58 y.o. female with a history of GERD, gastroparesis, left-sided abdominal pain, recurrent UTIs, anxiety, OSA, PCOS, HTN coming for evaluation of  abdominal pain  and sore throat.  States having intermittent episodes of abdominal pain in the left and right side of her abdomen for the last few months.  Also has some sore throat.  Has been taking Dexilant chronically for GERD.  Past Medical History:  Diagnosis Date   Abdominal pain    Allergies    Anemia    Anxiety    Arthritis    Complication of anesthesia    dizziness; almost passed out   Essential hypertension    Family history of breast cancer    Cousins on both sides of family   GERD (gastroesophageal reflux disease) 03/01/2017   History of melanoma    History of recurrent UTIs    Migraine    Multiple nevi    Obstructive sleep apnea    Panic disorder 1995   Polycystic ovarian disease     Past Surgical History:  Procedure Laterality Date   BIOPSY  04/09/2017   Procedure: BIOPSY;  Surgeon: Malissa Hippo, MD;  Location: [facility];  Service: Endoscopy;;  gastric esophageal    CHOLECYSTECTOMY  1991   COLONOSCOPY  2016   DILATION AND CURETTAGE OF UTERUS     ESOPHAGOGASTRODUODENOSCOPY N/A 04/09/2017   Procedure: ESOPHAGOGASTRODUODENOSCOPY (EGD);  Surgeon: Malissa Hippo, MD;  Location: [facility];  Service: Endoscopy;  Laterality: N/A;  9:25   LAPAROSCOPIC GASTRIC BANDING  07/07/11   LAPAROSCOPIC REPAIR AND REMOVAL OF GASTRIC BAND     UPPER GI ENDOSCOPY N/A 11/21/2012   Procedure: UPPER GI ENDOSCOPY [name] DIAGNOSTIC LAPAROSCOPY /REMOVAL LAP GASTRIC BAND/INSERTION OF DRAIN;  Surgeon: Valarie Merino, MD;  Location: [facility];  Service: General;  Laterality: N/A;    Family History  Problem Relation Age of Onset   Cervical cancer Mother    Stroke Mother    Aneurysm Mother    Cervical cancer Sister    Other  Paternal Grandmother        had part of colon removed   Breast cancer Cousin        Both sides of family   Cancer - Colon Paternal Uncle    Social History:  reports that she has never smoked. She has never used smokeless tobacco. She reports current alcohol use. She reports that she does not use drugs.  Allergies:  Allergies  Allergen Reactions   Ibuprofen Shortness Of Breath and Other (See Comments)    Lung specialist told her not to take    Macrobid [Nitrofurantoin Macrocrystal] Shortness Of Breath    Lung specialist Told it would kill her  SOB   Baclofen Other (See Comments)   Morphine And Related Nausea Only    dizziness   Topiramate Other (See Comments)    Cognitive side effects.   Tramadol     Low blood pressure   Other Palpitations    PSEUDO COUGH DECONGESTANT   Pseudoephedrine Palpitations    Medications Prior to Admission  Medication Sig Dispense Refill   acetaminophen (TYLENOL) 500 MG tablet Take 1,000 mg by mouth every 6 (six) hours as needed for moderate pain.     ALPRAZolam (XANAX) 1 MG tablet Take 1 mg by mouth in the morning, at noon, and at bedtime.     ascorbic acid (  VITAMIN C) 500 MG tablet Take 500 mg by mouth daily.     BELSOMRA 10 MG TABS Take 10 mg by mouth at bedtime as needed (sleep).     Biotin w/ Vitamins C & E (HAIR/SKIN/NAILS PO) Take 3 tablets by mouth daily.     botulinum toxin Type A (BOTOX) 200 units injection FOR OFFICE ADMINISTRATION. PHYSICIAN TO INJECT 155 UNITS INTRAMUSCULARLY INTO MUSCLES OF HEAD & NECK EVERY 12 WEEKS.  DISCARD REMAINDER. 1 each 3   dexlansoprazole (DEXILANT) 60 MG capsule TAKE 1 CAPSULE(60 MG) BY MOUTH DAILY 90 capsule 3   famotidine (PEPCID) 20 MG tablet Take 1 tablet (20 mg total) by mouth at bedtime as needed for heartburn or indigestion. (Patient taking differently: Take 20 mg by mouth at bedtime.)     metFORMIN (GLUCOPHAGE) 1000 MG tablet Take 1,000 mg by mouth daily.     MULTIPLE VITAMIN PO Take 1 tablet by mouth  daily.     NON FORMULARY Pt uses a cpap nighlty     nystatin (MYCOSTATIN/NYSTOP) powder Apply 1 Application topically daily as needed (yeast).     nystatin cream (MYCOSTATIN) Apply 1 Application topically daily as needed (yeast).     Probiotic Product (ALIGN) CHEW Chew 1 capsule by mouth daily.     Ubrogepant (UBRELVY) 100 MG TABS Take 1 tablet (100 mg total) by mouth as needed. 10 tablet 5   VITAMIN A PO Take 1 tablet by mouth daily.     MONUROL 3 g PACK Take 3 g by mouth daily as needed (UTI).  0   ondansetron (ZOFRAN-ODT) 4 MG disintegrating tablet DISSOLVE 1 TABLET(4 MG) ON THE TONGUE EVERY 8 HOURS AS NEEDED FOR NAUSEA OR VOMITING 20 tablet 0   OZEMPIC, 1 MG/DOSE, 4 MG/3ML SOPN Inject 1 mg into the skin once a week.     rizatriptan (MAXALT-MLT) 10 MG disintegrating tablet TAKE 1 TABLET BY MOUTH AS NEEDED FOR MIGRAINE. MAY REPEAT IN 2 HOURS AS NEEDED 12 tablet 11    Results for orders placed or performed during the hospital encounter of 02/02/23 (from the past 48 hour(s))  Glucose, capillary     Status: Abnormal   Collection Time: 02/02/23 10:39 AM  Result Value Ref Range   Glucose-Capillary 101 (H) 70 - 99 mg/dL    Comment: Glucose reference range applies only to samples taken after fasting for at least 8 hours.   No results found.  Review of Systems  All other systems reviewed and are negative.   Blood pressure (!) 129/90, pulse 71, temperature 98.3 F (36.8 C), temperature source Oral, resp. rate 20, height 5\' 7"  (1.702 m), weight 109 kg, last menstrual period 01/28/2023, SpO2 99 %. Physical Exam  GENERAL: The patient is AO x3, in no acute distress. HEENT: Head is normocephalic and atraumatic. EOMI are intact. Mouth is well hydrated and without lesions. NECK: Supple. No masses LUNGS: Clear to auscultation. No presence of rhonchi/wheezing/rales. Adequate chest expansion HEART: RRR, normal s1 and s2. ABDOMEN: Soft, nontender, no guarding, no peritoneal signs, and nondistended.  BS +. No masses. EXTREMITIES: Without any cyanosis, clubbing, rash, lesions or edema. NEUROLOGIC: AOx3, no focal motor deficit. SKIN: no jaundice, no rashes  Assessment/Plan Kimberly Ortega is a 57 y.o. female with a history of GERD, gastroparesis, left-sided abdominal pain, recurrent UTIs, anxiety, OSA, PCOS, HTN coming for evaluation of  abdominal pain  and sore throat.  Will proceed with EGD.  Dolores Frame, MD 02/02/2023, 10:53 AM

## 2023-02-02 NOTE — Anesthesia Procedure Notes (Signed)
Date/Time: 02/02/2023 10:57 AM  Performed by: Julian Reil, CRNAPre-anesthesia Checklist: Patient identified, Emergency Drugs available, Suction available and Patient being monitored Patient Re-evaluated:Patient Re-evaluated prior to induction Oxygen Delivery Method: Nasal cannula Induction Type: IV induction Placement Confirmation: positive ETCO2

## 2023-02-02 NOTE — Anesthesia Postprocedure Evaluation (Signed)
Anesthesia Post Note  Patient: Kimberly Ortega  Procedure(s) Performed: ESOPHAGOGASTRODUODENOSCOPY (EGD) WITH PROPOFOL BIOPSY  Patient location during evaluation: Phase II Anesthesia Type: General Level of consciousness: awake and alert and oriented Pain management: pain level controlled Vital Signs Assessment: post-procedure vital signs reviewed and stable Respiratory status: spontaneous breathing, nonlabored ventilation and respiratory function stable Cardiovascular status: blood pressure returned to baseline and stable Postop Assessment: no apparent nausea or vomiting Anesthetic complications: no  No notable events documented.   Last Vitals:  Vitals:   02/02/23 1109 02/02/23 1115  BP: (!) 93/57 100/69  Pulse: 79 75  Resp: 19 17  Temp: 36.5 C   SpO2: 94% 95%    Last Pain:  Vitals:   02/02/23 1109  TempSrc: Oral  PainSc: 0-No pain                 Finley Chevez C Gianah Batt

## 2023-02-02 NOTE — Discharge Instructions (Signed)
You are being discharged to home.  Resume your previous diet.  We are waiting for your pathology results.  Continue your present medications.  Your physician has recommended a repeat upper endoscopy in three years for surveillance.

## 2023-02-02 NOTE — Anesthesia Preprocedure Evaluation (Signed)
Anesthesia Evaluation  Patient identified by MRN, date of birth, ID band Patient awake    Reviewed: Allergy & Precautions, H&P , NPO status , Patient's Chart, lab work & pertinent test results  History of Anesthesia Complications (+) history of anesthetic complications (dizziness; almost passed out)  Airway Mallampati: III  TM Distance: >3 FB Neck ROM: Full  Mouth opening: Limited Mouth Opening  Dental  (+) Dental Advisory Given,    Pulmonary shortness of breath and with exertion, sleep apnea and Continuous Positive Airway Pressure Ventilation           Cardiovascular Exercise Tolerance: Good hypertension, Pt. on medications      Neuro/Psych  Headaches PSYCHIATRIC DISORDERS Anxiety      Neuromuscular disease    GI/Hepatic Neg liver ROS,GERD  Medicated and Controlled,,  Endo/Other  negative endocrine ROS    Renal/GU negative Renal ROS  negative genitourinary   Musculoskeletal  (+) Arthritis , Osteoarthritis,    Abdominal   Peds negative pediatric ROS (+)  Hematology  (+) Blood dyscrasia, anemia   Anesthesia Other Findings   Reproductive/Obstetrics negative OB ROS                             Anesthesia Physical Anesthesia Plan  ASA: 3  Anesthesia Plan: General   Post-op Pain Management: Minimal or no pain anticipated   Induction: Intravenous  PONV Risk Score and Plan: 1 and Propofol infusion  Airway Management Planned: Nasal Cannula and Natural Airway  Additional Equipment:   Intra-op Plan:   Post-operative Plan:   Informed Consent: I have reviewed the patients History and Physical, chart, labs and discussed the procedure including the risks, benefits and alternatives for the proposed anesthesia with the patient or authorized representative who has indicated his/her understanding and acceptance.     Dental advisory given  Plan Discussed with: CRNA and  Surgeon  Anesthesia Plan Comments:        Anesthesia Quick Evaluation

## 2023-02-02 NOTE — Progress Notes (Signed)
Patient states she is currently on her menstrual cycle, patient refused pregnancy test after I explained this is our policy. Patient states she has not had sex for 6 years and can not be pregnant.

## 2023-02-03 LAB — SURGICAL PATHOLOGY

## 2023-02-04 ENCOUNTER — Other Ambulatory Visit (HOSPITAL_COMMUNITY): Payer: Self-pay

## 2023-02-05 ENCOUNTER — Ambulatory Visit (INDEPENDENT_AMBULATORY_CARE_PROVIDER_SITE_OTHER): Payer: 59 | Admitting: Clinical

## 2023-02-05 ENCOUNTER — Encounter (INDEPENDENT_AMBULATORY_CARE_PROVIDER_SITE_OTHER): Payer: Self-pay | Admitting: *Deleted

## 2023-02-05 DIAGNOSIS — F419 Anxiety disorder, unspecified: Secondary | ICD-10-CM | POA: Diagnosis not present

## 2023-02-05 DIAGNOSIS — F331 Major depressive disorder, recurrent, moderate: Secondary | ICD-10-CM

## 2023-02-05 DIAGNOSIS — F410 Panic disorder [episodic paroxysmal anxiety] without agoraphobia: Secondary | ICD-10-CM

## 2023-02-05 NOTE — Progress Notes (Signed)
Virtual Visit via Video Note   I connected with Kimberly Ortega on 01/20/23 at 11:00 AM EST by a video enabled telemedicine application and verified that I am speaking with the correct person using two identifiers.   Location: Patient: Home Provider: Office   I discussed the limitations of evaluation and management by telemedicine and the availability of in person appointments. The patient expressed understanding and agreed to proceed.   THERAPIST PROGRESS NOTE     Session Time: 11:00 AM-11:30 AM   Participation Level: Active   Behavioral Response: Casual and Alert,Depressed   Type of Therapy: Individual Therapy   Treatment Goals addressed: Depression and Anxiety   Interventions: CBT   Summary: Kimberly Ortega is a 57 y.o. female who presents with Depression with Anxiety and Panic Disorder. The OPT therapist worked with the patient for her ongoing OPT treatment. The OPT therapist utilized Motivational Interviewing to assist in creating therapeutic repore. The patient in the session was engaged and work in collaboration giving feedback about her triggers and symptoms over the past few weeks. The patient spoke about her ongoing health struggles with migraines,sciatica, pain in throat and sleep. The patient spoke about upcoming back surgery scheduled for next week. The OPT therapist utilized Cognitive Behavioral Therapy through cognitive restructuring as well as worked with the patient on coping strategies to assist in management of mood. The OPT therapist overviewed in session with the patient basic need areas examining the patients current eating habits, sleep schedule, exercise, and hygiene. The OpT therapist overviewed with the patient her support network going to , during, and post upcoming medical surgery.   Suicidal/Homicidal: Nowithout intent/plan   Therapist Response:The OPT therapist worked with the patient for the patients scheduled session. The patient was engaged in her session and  gave feedback in relation to triggers, symptoms, and behavior responses over the past few weeks. The OPT therapist worked with the patient utilizing an in session Cognitive Behavioral Therapy exercise. The patient was responsive in the session and verbalized, " I am worried  about this surgery next week I will be there for 8hrs".  The OPT therapist overviewed with the patient her support systems lead up to , during , and post her upcoming surgery.The OPT therapist worked with the patient to challenge her negative thoughts and empower her to continue to work with her health providers in working her health problems and getting to desired outcomes. The OPT therapist worked with the patient on importance of utilizing coping skills and consistency with the recommendations ongoing of her physical health providers.The OPT therapist will continue treatment work with the patient in her next scheduled session   Plan: Return again in 2/3 weeks.   Diagnosis:      Axis I: Recurrent Moderate Major Depressive Disorder with Anxiety. Panic Disorder    Axis II: No diagnosis      Collaboration of Care: No additional collaboration of care for this session.   Patient/Guardian was advised Release of Information must be obtained prior to any record release in order to collaborate their care with an outside provider. Patient/Guardian was advised if they have not already done so to contact the registration department to sign all necessary forms in order for [name] to release information regarding their care.    Consent: Patient/Guardian gives verbal consent for treatment and assignment of benefits for services provided during this visit. Patient/Guardian expressed understanding and agreed to proceed.    I discussed the assessment and treatment plan with the patient. The patient  was provided an opportunity to ask questions and all were answered. The patient agreed with the plan and demonstrated an understanding of the  instructions.   The patient was advised to call back or seek an in-person evaluation if the symptoms worsen or if the condition fails to improve as anticipated.   I provided 30 minutes of non-face-to-face time during this encounter.   [name], LCSW   02/05/2023

## 2023-02-08 ENCOUNTER — Encounter (HOSPITAL_COMMUNITY): Payer: Self-pay | Admitting: Gastroenterology

## 2023-02-08 ENCOUNTER — Telehealth: Payer: Self-pay | Admitting: Neurology

## 2023-02-08 ENCOUNTER — Other Ambulatory Visit: Payer: Self-pay

## 2023-02-08 NOTE — Progress Notes (Signed)
Anesthesia Chart Review: Same day workup  57 year old female with pertinent history including OSA on CPAP, GERD, HTN, gastroparesis, recurrent left-sided abdominal pain, migraines, PCOS.  She was recently evaluated at the ED on 01/28/2023 with a 1 day history of intermittent, sharp chest pain.  Symptoms were nonexertional, began while she was sitting down 1 day prior.  Workup was benign.  EKG, serial cardiac enzymes, blood work, and x-ray were all within normal limits.  No additional outpatient workup was recommended.  Of note, patient had prior stress echocardiogram July 2017 which was normal.  Patient subsequently underwent EGD on 02/02/2023 for evaluation of generalized abdominal pain which showed a 1 cm hiatal hernia, normal stomach, normal examined duodenum.  Patient is on once weekly GLP-1 agonist Ozempic, per med rec the medication is on hold.  BMP and CBC from 01/28/2023 reviewed, WNL.  EKG 01/29/2023: NSR.  Rate 83.  Stress echo 03/25/2016: Study Conclusions   - Staged echo: Normal echo stress   Impressions:   - Normal study after maximal exercise. Duke treadmill score of 9    consistent with low risk for major cardiac events.    Zannie Cove [facility] Short Stay Center/Anesthesiology Phone [phone] 02/08/2023 4:10 PM

## 2023-02-08 NOTE — Progress Notes (Addendum)
Kimberly Ortega denies chest pain or shortness of breath.  Patient denies having any s/s of Covid in her household, also denies any known exposure to Covid. Kimberly Ortega denies any s/s of upper or lower respiratory in the past 8 weeks.   Kimberly Ortega was seen at  at [facility] ED  on 01/28/23 with complaints of chest pain. EKG and labs were in normal limits- patient was discharged. Kimberly Ortega was seen in 2017 with chest pains, a stress test was done, patient said that it is thought to be anxiety.  Patient's PCP is Dr.Dhruv Vyas.  Kimberly Ortega has PCOS, she is taking Ozempic or Metformin.  Patient has been off Ozempic for over a month, Metformin over 2 weeks- stopped before EGD, which was on 02/02/23.

## 2023-02-08 NOTE — Anesthesia Preprocedure Evaluation (Addendum)
Anesthesia Evaluation  Patient identified by MRN, date of birth, ID band Patient awake    Reviewed: Allergy & Precautions, H&P , NPO status , Patient's Chart, lab work & pertinent test results  Airway Mallampati: IV  TM Distance: >3 FB Neck ROM: Full    Dental no notable dental hx. (+) Teeth Intact, Dental Advisory Given   Pulmonary sleep apnea and Continuous Positive Airway Pressure Ventilation    Pulmonary exam normal breath sounds clear to auscultation       Cardiovascular Exercise Tolerance: Good hypertension,  Rhythm:Regular Rate:Normal     Neuro/Psych  Headaches  Anxiety        GI/Hepatic Neg liver ROS,GERD  Medicated,,  Endo/Other  diabetes, Type 2, Oral Hypoglycemic Agents  Morbid obesity  Renal/GU negative Renal ROS  negative genitourinary   Musculoskeletal  (+) Arthritis , Osteoarthritis,    Abdominal   Peds  Hematology  (+) Blood dyscrasia, anemia   Anesthesia Other Findings   Reproductive/Obstetrics negative OB ROS                             Anesthesia Physical Anesthesia Plan  ASA: 3  Anesthesia Plan: General   Post-op Pain Management: Tylenol PO (pre-op)*   Induction: Intravenous  PONV Risk Score and Plan: 4 or greater and Ondansetron, Dexamethasone and Midazolam  Airway Management Planned: Oral ETT  Additional Equipment:   Intra-op Plan:   Post-operative Plan: Extubation in OR  Informed Consent: I have reviewed the patients History and Physical, chart, labs and discussed the procedure including the risks, benefits and alternatives for the proposed anesthesia with the patient or authorized representative who has indicated his/her understanding and acceptance.     Dental advisory given  Plan Discussed with: CRNA  Anesthesia Plan Comments: (PAT note by Antionette Poles, PA-C: 57 year old female with pertinent history including OSA on CPAP, GERD, HTN,  gastroparesis, recurrent left-sided abdominal pain, migraines, PCOS.  She was recently evaluated at the ED on 01/28/2023 with a 1 day history of intermittent, sharp chest pain.  Symptoms were nonexertional, began while she was sitting down 1 day prior.  Workup was benign.  EKG, serial cardiac enzymes, blood work, and x-ray were all within normal limits.  No additional outpatient workup was recommended.  Of note, patient had prior stress echocardiogram July 2017 which was normal.  Patient subsequently underwent EGD on 02/02/2023 for evaluation of generalized abdominal pain which showed a 1 cm hiatal hernia, normal stomach, normal examined duodenum.  Patient is on once weekly GLP-1 agonist Ozempic, per med rec the medication is on hold.  BMP and CBC from 01/28/2023 reviewed, WNL.  EKG 01/29/2023: NSR.  Rate 83.  Stress echo 03/25/2016: Study Conclusions   - Staged echo: Normal echo stress   Impressions:   - Normal study after maximal exercise. Duke treadmill score of 9  consistent with low risk for major cardiac events.    )        Anesthesia Quick Evaluation

## 2023-02-08 NOTE — Telephone Encounter (Signed)
Pt states re: her date of service on 01-18-23 she needs EOB or Remittance of Advice needs to be sent to [employer] [facility] by fax [phone] pt said if there are questions on this request she can be called.

## 2023-02-09 ENCOUNTER — Other Ambulatory Visit (HOSPITAL_COMMUNITY): Payer: Self-pay

## 2023-02-09 ENCOUNTER — Encounter (HOSPITAL_COMMUNITY): Payer: Self-pay | Admitting: Neurological Surgery

## 2023-02-09 ENCOUNTER — Encounter (HOSPITAL_COMMUNITY)
Admission: RE | Disposition: A | Discharge status: Home / Self Care | Payer: Self-pay | Source: Home / Self Care | Attending: Neurological Surgery

## 2023-02-09 ENCOUNTER — Ambulatory Visit (HOSPITAL_COMMUNITY): Payer: 59

## 2023-02-09 ENCOUNTER — Ambulatory Visit (HOSPITAL_COMMUNITY): Payer: 59 | Admitting: Physician Assistant

## 2023-02-09 ENCOUNTER — Ambulatory Visit (HOSPITAL_BASED_OUTPATIENT_CLINIC_OR_DEPARTMENT_OTHER): Payer: 59 | Admitting: Physician Assistant

## 2023-02-09 ENCOUNTER — Telehealth: Payer: Self-pay

## 2023-02-09 ENCOUNTER — Other Ambulatory Visit: Payer: Self-pay

## 2023-02-09 ENCOUNTER — Ambulatory Visit (HOSPITAL_COMMUNITY)
Admission: RE | Admit: 2023-02-09 | Discharge: 2023-02-09 | Disposition: A | Discharge status: Home / Self Care | Payer: 59 | Attending: Neurological Surgery | Admitting: Neurological Surgery

## 2023-02-09 DIAGNOSIS — G4733 Obstructive sleep apnea (adult) (pediatric): Secondary | ICD-10-CM | POA: Diagnosis not present

## 2023-02-09 DIAGNOSIS — E119 Type 2 diabetes mellitus without complications: Secondary | ICD-10-CM | POA: Diagnosis not present

## 2023-02-09 DIAGNOSIS — I10 Essential (primary) hypertension: Secondary | ICD-10-CM | POA: Diagnosis not present

## 2023-02-09 DIAGNOSIS — Z7984 Long term (current) use of oral hypoglycemic drugs: Secondary | ICD-10-CM

## 2023-02-09 DIAGNOSIS — K219 Gastro-esophageal reflux disease without esophagitis: Secondary | ICD-10-CM | POA: Diagnosis not present

## 2023-02-09 DIAGNOSIS — Z9989 Dependence on other enabling machines and devices: Secondary | ICD-10-CM

## 2023-02-09 DIAGNOSIS — M5116 Intervertebral disc disorders with radiculopathy, lumbar region: Secondary | ICD-10-CM

## 2023-02-09 DIAGNOSIS — Z6838 Body mass index (BMI) 38.0-38.9, adult: Secondary | ICD-10-CM | POA: Insufficient documentation

## 2023-02-09 HISTORY — DX: Dyspnea, unspecified: R06.00

## 2023-02-09 HISTORY — PX: LUMBAR LAMINECTOMY/ DECOMPRESSION WITH MET-RX: SHX5959

## 2023-02-09 LAB — SURGICAL PCR SCREEN
MRSA, PCR: NEGATIVE
Staphylococcus aureus: NEGATIVE

## 2023-02-09 LAB — GLUCOSE, CAPILLARY
Glucose-Capillary: 87 mg/dL (ref 70–99)
Glucose-Capillary: 112 mg/dL — ABNORMAL HIGH (ref 70–99)

## 2023-02-09 SURGERY — LUMBAR LAMINECTOMY/ DECOMPRESSION WITH MET-RX
Anesthesia: General | Site: Spine Lumbar | Laterality: Right

## 2023-02-09 MED ORDER — 0.9 % SODIUM CHLORIDE (POUR BTL) OPTIME
TOPICAL | Status: DC | PRN
  Administered 2023-02-09: 1000 mL

## 2023-02-09 MED ORDER — CHLORHEXIDINE GLUCONATE 0.12 % MT SOLN
OROMUCOSAL | Status: AC
  Administered 2023-02-09: 15 mL
  Filled 2023-02-09: qty 15

## 2023-02-09 MED ORDER — PROPOFOL 10 MG/ML IV BOLUS
INTRAVENOUS | Status: AC
  Filled 2023-02-09: qty 20

## 2023-02-09 MED ORDER — CEFAZOLIN SODIUM-DEXTROSE 2-4 GM/100ML-% IV SOLN
2.0000 g | INTRAVENOUS | Status: AC
  Administered 2023-02-09: 2 g via INTRAVENOUS
  Filled 2023-02-09: qty 100

## 2023-02-09 MED ORDER — ACETAMINOPHEN 500 MG PO TABS
1000.0000 mg | ORAL_TABLET | Freq: Once | ORAL | Status: AC
  Administered 2023-02-09: 1000 mg via ORAL
  Filled 2023-02-09: qty 2

## 2023-02-09 MED ORDER — DEXAMETHASONE SODIUM PHOSPHATE 10 MG/ML IJ SOLN
INTRAMUSCULAR | Status: DC | PRN
  Administered 2023-02-09: 4 mg via INTRAVENOUS

## 2023-02-09 MED ORDER — MIDAZOLAM HCL 2 MG/2ML IJ SOLN
INTRAMUSCULAR | Status: AC
  Filled 2023-02-09: qty 2

## 2023-02-09 MED ORDER — SUGAMMADEX SODIUM 200 MG/2ML IV SOLN
INTRAVENOUS | Status: DC | PRN
  Administered 2023-02-09: 220 mg via INTRAVENOUS

## 2023-02-09 MED ORDER — THROMBIN 5000 UNITS EX SOLR
CUTANEOUS | Status: AC
  Filled 2023-02-09: qty 5000

## 2023-02-09 MED ORDER — PHENYLEPHRINE HCL-NACL 20-0.9 MG/250ML-% IV SOLN
INTRAVENOUS | Status: DC | PRN
  Administered 2023-02-09: 30 ug/min via INTRAVENOUS

## 2023-02-09 MED ORDER — DEXMEDETOMIDINE HCL IN NACL 80 MCG/20ML IV SOLN
INTRAVENOUS | Status: DC | PRN
  Administered 2023-02-09 (×2): 8 ug via INTRAVENOUS
  Administered 2023-02-09: 4 ug via INTRAVENOUS

## 2023-02-09 MED ORDER — LACTATED RINGERS IV SOLN: INTRAVENOUS | Status: DC | PRN

## 2023-02-09 MED ORDER — LIDOCAINE 2% (20 MG/ML) 5 ML SYRINGE
INTRAMUSCULAR | Status: DC | PRN
  Administered 2023-02-09: 80 mg via INTRAVENOUS

## 2023-02-09 MED ORDER — OXYCODONE HCL 5 MG PO CAPS: 5.0000 mg | ORAL_CAPSULE | ORAL | 0 refills | Status: DC | PRN

## 2023-02-09 MED ORDER — HYDROMORPHONE HCL 1 MG/ML IJ SOLN
INTRAMUSCULAR | Status: AC
  Filled 2023-02-09: qty 1

## 2023-02-09 MED ORDER — MIDAZOLAM HCL 5 MG/5ML IJ SOLN
INTRAMUSCULAR | Status: DC | PRN
  Administered 2023-02-09: 2 mg via INTRAVENOUS

## 2023-02-09 MED ORDER — CYCLOBENZAPRINE HCL 10 MG PO TABS: 10.0000 mg | ORAL_TABLET | Freq: Three times a day (TID) | ORAL | 0 refills | Status: DC | PRN

## 2023-02-09 MED ORDER — OXYCODONE HCL 5 MG PO TABS
5.0000 mg | ORAL_TABLET | Freq: Once | ORAL | Status: AC
  Administered 2023-02-09: 5 mg via ORAL

## 2023-02-09 MED ORDER — OXYCODONE HCL 5 MG PO TABS
ORAL_TABLET | ORAL | Status: AC
  Filled 2023-02-09: qty 1

## 2023-02-09 MED ORDER — THROMBIN 5000 UNITS EX SOLR
OROMUCOSAL | Status: DC | PRN
  Administered 2023-02-09: 5 mL via TOPICAL

## 2023-02-09 MED ORDER — FENTANYL CITRATE (PF) 250 MCG/5ML IJ SOLN
INTRAMUSCULAR | Status: DC | PRN
  Administered 2023-02-09: 25 ug via INTRAVENOUS
  Administered 2023-02-09: 50 ug via INTRAVENOUS
  Administered 2023-02-09: 25 ug via INTRAVENOUS
  Administered 2023-02-09 (×2): 50 ug via INTRAVENOUS

## 2023-02-09 MED ORDER — ROCURONIUM BROMIDE 10 MG/ML (PF) SYRINGE
PREFILLED_SYRINGE | INTRAVENOUS | Status: DC | PRN
  Administered 2023-02-09: 50 mg via INTRAVENOUS

## 2023-02-09 MED ORDER — CHLORHEXIDINE GLUCONATE CLOTH 2 % EX PADS: 6.0000 | MEDICATED_PAD | Freq: Once | CUTANEOUS | Status: DC

## 2023-02-09 MED ORDER — ONDANSETRON HCL 4 MG/2ML IJ SOLN
INTRAMUSCULAR | Status: DC | PRN
  Administered 2023-02-09: 4 mg via INTRAVENOUS

## 2023-02-09 MED ORDER — PROPOFOL 10 MG/ML IV BOLUS
INTRAVENOUS | Status: DC | PRN
  Administered 2023-02-09: 200 mg via INTRAVENOUS

## 2023-02-09 MED ORDER — HYDROMORPHONE HCL 1 MG/ML IJ SOLN
0.2500 mg | INTRAMUSCULAR | Status: DC | PRN
  Administered 2023-02-09 (×4): 0.5 mg via INTRAVENOUS

## 2023-02-09 MED ORDER — PHENYLEPHRINE 80 MCG/ML (10ML) SYRINGE FOR IV PUSH (FOR BLOOD PRESSURE SUPPORT)
PREFILLED_SYRINGE | INTRAVENOUS | Status: DC | PRN
  Administered 2023-02-09 (×2): 80 ug via INTRAVENOUS

## 2023-02-09 MED ORDER — FENTANYL CITRATE (PF) 250 MCG/5ML IJ SOLN
INTRAMUSCULAR | Status: AC
  Filled 2023-02-09: qty 5

## 2023-02-09 MED ORDER — LIDOCAINE-EPINEPHRINE 1 %-1:100000 IJ SOLN
INTRAMUSCULAR | Status: AC
  Filled 2023-02-09: qty 1

## 2023-02-09 MED ORDER — LIDOCAINE-EPINEPHRINE 1 %-1:100000 IJ SOLN
INTRAMUSCULAR | Status: DC | PRN
  Administered 2023-02-09: 10 mL

## 2023-02-09 SURGICAL SUPPLY — 53 items
ADH SKN CLS APL DERMABOND .7 (GAUZE/BANDAGES/DRESSINGS) ×1
BAG COUNTER SPONGE SURGICOUNT (BAG) ×2 IMPLANT
BAG SPNG CNTER NS LX DISP (BAG) ×1
BLADE CLIPPER SURG (BLADE) IMPLANT
BLADE SURG 11 STRL SS (BLADE) ×2 IMPLANT
BUR SURG IBUR 4X12.5 (BURR) IMPLANT
BUR SURGICAL HEAD PROX 3X12.5 (BURR) ×2 IMPLANT
BURR SURG IBUR 4X12.5 (BURR)
BURR SURGICAL HEAD PROX 3X12.5 (BURR) ×1
CANISTER SUCT 3000ML PPV (MISCELLANEOUS) ×2 IMPLANT
DERMABOND ADVANCED .7 DNX12 (GAUZE/BANDAGES/DRESSINGS) ×2 IMPLANT
DRAPE C-ARM 42X72 X-RAY (DRAPES) ×4 IMPLANT
DRAPE LAPAROTOMY 100X72X124 (DRAPES) ×2 IMPLANT
DRAPE MICROSCOPE SLANT 54X150 (MISCELLANEOUS) ×2 IMPLANT
DRAPE SURG 17X23 STRL (DRAPES) ×2 IMPLANT
DURAPREP 26ML APPLICATOR (WOUND CARE) ×2 IMPLANT
ELECT BLADE 6.5 EXT (BLADE) ×2 IMPLANT
ELECT REM PT RETURN 9FT ADLT (ELECTROSURGICAL) ×1
ELECTRODE REM PT RTRN 9FT ADLT (ELECTROSURGICAL) ×2 IMPLANT
GAUZE 4X4 16PLY ~~LOC~~+RFID DBL (SPONGE) IMPLANT
GAUZE SPONGE 4X4 12PLY STRL (GAUZE/BANDAGES/DRESSINGS) IMPLANT
GLOVE BIO SURGEON STRL SZ7.5 (GLOVE) ×2 IMPLANT
GLOVE BIOGEL PI IND STRL 7.0 (GLOVE) ×2 IMPLANT
GLOVE BIOGEL PI IND STRL 7.5 (GLOVE) ×2 IMPLANT
GLOVE ECLIPSE 7.5 STRL STRAW (GLOVE) ×2 IMPLANT
GLOVE EXAM NITRILE LRG STRL (GLOVE) IMPLANT
GLOVE EXAM NITRILE XL STR (GLOVE) IMPLANT
GLOVE EXAM NITRILE XS STR PU (GLOVE) IMPLANT
GOWN STRL REUS W/ TWL LRG LVL3 (GOWN DISPOSABLE) ×4 IMPLANT
GOWN STRL REUS W/ TWL XL LVL3 (GOWN DISPOSABLE) IMPLANT
GOWN STRL REUS W/TWL 2XL LVL3 (GOWN DISPOSABLE) IMPLANT
GOWN STRL REUS W/TWL LRG LVL3 (GOWN DISPOSABLE) ×2
GOWN STRL REUS W/TWL XL LVL3 (GOWN DISPOSABLE)
HEMOSTAT POWDER KIT SURGIFOAM (HEMOSTASIS) ×2 IMPLANT
KIT BASIN OR (CUSTOM PROCEDURE TRAY) ×2 IMPLANT
KIT TURNOVER KIT B (KITS) ×2 IMPLANT
NDL SPNL 18GX3.5 QUINCKE PK (NEEDLE) ×2 IMPLANT
NEEDLE HYPO 22GX1.5 SAFETY (NEEDLE) ×2 IMPLANT
NEEDLE SPNL 18GX3.5 QUINCKE PK (NEEDLE) ×1 IMPLANT
NS IRRIG 1000ML POUR BTL (IV SOLUTION) ×2 IMPLANT
PACK LAMINECTOMY NEURO (CUSTOM PROCEDURE TRAY) ×2 IMPLANT
PAD ARMBOARD 7.5X6 YLW CONV (MISCELLANEOUS) ×6 IMPLANT
SOL ELECTROSURG ANTI STICK (MISCELLANEOUS) ×1
SOLUTION ELECTROSURG ANTI STCK (MISCELLANEOUS) ×2 IMPLANT
SPIKE FLUID TRANSFER (MISCELLANEOUS) ×2 IMPLANT
SPONGE T-LAP 4X18 ~~LOC~~+RFID (SPONGE) IMPLANT
SUT MNCRL AB 3-0 PS2 18 (SUTURE) IMPLANT
SUT VIC AB 0 CT1 18XCR BRD8 (SUTURE) IMPLANT
SUT VIC AB 0 CT1 8-18 (SUTURE)
SUT VIC AB 2-0 CP2 18 (SUTURE) ×2 IMPLANT
TOWEL GREEN STERILE (TOWEL DISPOSABLE) ×2 IMPLANT
TOWEL GREEN STERILE FF (TOWEL DISPOSABLE) ×2 IMPLANT
WATER STERILE IRR 1000ML POUR (IV SOLUTION) ×2 IMPLANT

## 2023-02-09 NOTE — Transfer of Care (Signed)
Immediate Anesthesia Transfer of Care Note  Patient: Kimberly Ortega  Procedure(s) Performed: Right Lumbar four-five Minimally Invasive Laminectomy, Discectomy (Right: Spine Lumbar)  Patient Location: PACU  Anesthesia Type:General  Level of Consciousness: awake and alert   Airway & Oxygen Therapy: Patient Spontanous Breathing and Patient connected to nasal cannula oxygen  Post-op Assessment: Report given to RN and Post -op Vital signs reviewed and stable  Post vital signs: Reviewed and stable  Last Vitals:  Vitals Value Taken Time  BP 118/73 02/09/23 0943  Temp 36.1 C 02/09/23 0943  Pulse 86 02/09/23 0945  Resp 12 02/09/23 0945  SpO2 96 % 02/09/23 0945  Vitals shown include unvalidated device data.  Last Pain:  Vitals:   02/09/23 0943  TempSrc:   PainSc: Asleep         Complications: No notable events documented.

## 2023-02-09 NOTE — Progress Notes (Signed)
Patient refused pregnancy test. Stated she hasn't had sex in 6 years.  Patient dos not want her spouse, Maisie Fus  to have any information on her. He  is only here for transportation reasons.  Patient states if something happens to her she doesn't want  her clothes/belongings given to the spouse. Belongings  are to be given to her sister April Amos.

## 2023-02-09 NOTE — H&P (Signed)
Surgical H&P Update  HPI: 57 y.o. with a history of right lower extremity and low back pain since 01/2022 despite nonsurgical treatment. Workup showed a right L4-5 HNP. No changes in health since they were last seen. Still having the above and wishes to proceed with surgery.  PMHx:  Past Medical History:  Diagnosis Date   Abdominal pain    Allergies    Anemia    Anxiety    Arthritis    Cancer (HCC)    Complication of anesthesia 1991   dizziness; almost passed out, post op   Dyspnea    Family history of breast cancer    Cousins on both sides of family   GERD (gastroesophageal reflux disease) 03/01/2017   History of melanoma    History of recurrent UTIs    Migraine    Multiple nevi    Obstructive sleep apnea    Panic disorder 1995   Polycystic ovarian disease    FamHx:  Family History  Problem Relation Age of Onset   Cervical cancer Mother    Stroke Mother    Aneurysm Mother    Cervical cancer Sister    Other Paternal Grandmother        had part of colon removed   Breast cancer Cousin        Both sides of family   Cancer - Colon Paternal Uncle    SocHx:  reports that she has never smoked. She has never used smokeless tobacco. She reports current alcohol use. She reports that she does not use drugs.  Physical Exam: Strength 5/5 x4 and SILTx4 except R L5 numbness  Assesment/Plan: 57 y.o. woman with R L5 radiculopathy 2/2 R L4-5 HNP, here for R L4-5 MIS discectomy. Risks, benefits, and alternatives discussed and the patient would like to continue with surgery.  -OR today -home from PACU post-op  Jadene Pierini, MD 02/09/23 7:33 AM

## 2023-02-09 NOTE — Discharge Instructions (Addendum)
Discharge Instructions ° °No restriction in activities, slowly increase your activity back to normal.  ° °Your incision is closed with dermabond (purple glue). This will naturally fall off over the next 1-2 weeks.  ° °Okay to shower on the day of discharge. Use regular soap and water and try to be gentle when cleaning your incision.  ° °Follow up with Dr. Ostergard in 2 weeks after discharge. If you do not already have a discharge appointment, please call his office at [phone] to schedule a follow up appointment. If you have any concerns or questions, please call the office and let us know. °

## 2023-02-09 NOTE — Telephone Encounter (Signed)
Pharmacy Patient Advocate Encounter   Received notification from [employer] that prior authorization for Ubrelvy 100MG  tablets is required/requested.   PA submitted on 02/09/2023 to (ins) [employer] via CoverMyMeds Key or (Medicaid) confirmation # [id number] Status is pending

## 2023-02-09 NOTE — Anesthesia Procedure Notes (Signed)
Procedure Name: Intubation Date/Time: 02/09/2023 7:44 AM  Performed by: Rachel Moulds, CRNAPre-anesthesia Checklist: Patient identified, Emergency Drugs available, Suction available, Patient being monitored and Timeout performed Patient Re-evaluated:Patient Re-evaluated prior to induction Oxygen Delivery Method: Circle system utilized Preoxygenation: Pre-oxygenation with 100% oxygen Ventilation: Mask ventilation without difficulty Grade View: Grade I Tube type: Oral Tube size: 7.0 mm Airway Equipment and Method: Video-laryngoscopy and Rigid stylet Placement Confirmation: breath sounds checked- equal and bilateral, CO2 detector, positive ETCO2 and ETT inserted through vocal cords under direct vision Secured at: 22 cm Tube secured with: Tape Dental Injury: Teeth and Oropharynx as per pre-operative assessment

## 2023-02-09 NOTE — Op Note (Signed)
PATIENT: Kimberly Ortega  DAY OF SURGERY: 02/09/23   PRE-OPERATIVE DIAGNOSIS:  Herniated nucleus pulposus with lumbar radiculopathy   POST-OPERATIVE DIAGNOSIS:  Herniated nucleus pulposus with lumbar radiculopathy   PROCEDURE:  Right minimally invasive L4-5 discectomy   SURGEON:  Surgeon(s) and Role:    Jadene Pierini, MD    Emilee Hero PA   ANESTHESIA: ETGA   BRIEF HISTORY: This is a 57 year old woman who presented with right L5 radiculopathy, MRI showed a corresponding disc herniation and and nerve root compression. I therefore recommended a minimally invasive L4-5 discectomy. This was discussed with the patient as well as risks, benefits, and alternatives and the patient wished to proceed with surgical treatment.    OPERATIVE DETAIL: The patient was taken to the operating room and placed on the OR table in the prone position. A formal time out was performed with two patient identifiers and confirmed the operative site. Anesthesia was induced by the anesthesia team. The operative site was marked, hair was clipped with surgical clippers, the area was then prepped and draped in a sterile fashion. Fluoroscopy was used to identify the surgical level prior to incision.   A 2cm incision was then marked 1cm off to the right of midline. The fascia was incised sharply and serial dilators were docked to the lamino-facet junction using fluoroscopy to confirm position as well as perform a second count to confirm the correct surgical level. After a final dilator was placed, a tubular retractor was placed over this and secured to the table. The operating microscope was draped and brought into the field. Anatomy was palpated and confirmed, monopolar cautery was used to expose the facet, lamina, and a portion of the spinous process to confirm orientation. A high speed drill and kerrison rongeurs were then used to create a hemilaminotomy and small medial facetectomy. The ligamentum flavum was resected  and the thecal sac and traversing nerve root were identified. I first identified the right L5 foramen and confirmed this on fluoroscopy then followed the root back to the disc space The traversing nerve root was gently retracted medially to expose the disc space using a suction-retractor. A large disc herniation was clearly present. An annulotomy was created sharply and large, free disc fragments that were easily removed. These were removed and followed laterally to the foramen and medially until good decompression was confirmed with one notable large free fragment.The traversing nerve root was palpated throughout its visible course to confirm good decompression. The medial aspect of the neuro-foramen was also probed with a right angle ball-tip probe to confirm decompression of the exiting nerve root.   Kimberly Consentino PA was scrubbed and assisted with the entire procedure which included exposure, discectomy and closure.  Hemostasis was obtained, the wound was copiously irrigated, and the tube was removed while using the microscope to confirm hemostasis of the muscle edges. All instrument and sponge counts were correct and the incision was then closed in layers. [name]. No apparent complications at the completion of the procedure.    EBL:  50mL   DRAINS: none   SPECIMENS: none   Jadene Pierini, MD

## 2023-02-09 NOTE — Anesthesia Postprocedure Evaluation (Signed)
Anesthesia Post Note  Patient: Gaytha Gerfen  Procedure(s) Performed: Right Lumbar four-five Minimally Invasive Laminectomy, Discectomy (Right: Spine Lumbar)     Patient location during evaluation: PACU Anesthesia Type: General Level of consciousness: awake and alert Pain management: pain level controlled Vital Signs Assessment: post-procedure vital signs reviewed and stable Respiratory status: spontaneous breathing, nonlabored ventilation and respiratory function stable Cardiovascular status: blood pressure returned to baseline and stable Postop Assessment: no apparent nausea or vomiting Anesthetic complications: no  No notable events documented.  Last Vitals:  Vitals:   02/09/23 1030 02/09/23 1045  BP: 97/60 97/67  Pulse: 69 70  Resp: 14 17  Temp:  (!) 36.4 C  SpO2: 93% 96%    Last Pain:  Vitals:   02/09/23 1034  TempSrc:   PainSc: 6                  Harlym Gehling,W. EDMOND

## 2023-02-10 ENCOUNTER — Encounter (HOSPITAL_COMMUNITY): Payer: Self-pay | Admitting: Neurological Surgery

## 2023-02-11 ENCOUNTER — Ambulatory Visit (INDEPENDENT_AMBULATORY_CARE_PROVIDER_SITE_OTHER): Payer: Self-pay | Admitting: Gastroenterology

## 2023-02-14 ENCOUNTER — Other Ambulatory Visit (HOSPITAL_COMMUNITY): Payer: Self-pay

## 2023-02-14 NOTE — Telephone Encounter (Signed)
Pharmacy Patient Advocate Encounter  Prior Authorization for [name] 100MG  tablets has been approved by [employer] (ins).    PA # PA Case ID #: [id number] Effective dates: 02/13/2023 through 05/12/2023  Must fill with [facility]

## 2023-02-25 ENCOUNTER — Ambulatory Visit (INDEPENDENT_AMBULATORY_CARE_PROVIDER_SITE_OTHER): Payer: 59 | Admitting: Clinical

## 2023-02-25 DIAGNOSIS — F410 Panic disorder [episodic paroxysmal anxiety] without agoraphobia: Secondary | ICD-10-CM

## 2023-02-25 DIAGNOSIS — F419 Anxiety disorder, unspecified: Secondary | ICD-10-CM | POA: Diagnosis not present

## 2023-02-25 DIAGNOSIS — F331 Major depressive disorder, recurrent, moderate: Secondary | ICD-10-CM

## 2023-02-25 NOTE — Progress Notes (Signed)
Virtual Visit via Video Note   I connected with Kimberly Ortega on 02/25/23 at 1:00 PM EST by a video enabled telemedicine application and verified that I am speaking with the correct person using two identifiers.   Location: Patient: Home Provider: Office   I discussed the limitations of evaluation and management by telemedicine and the availability of in person appointments. The patient expressed understanding and agreed to proceed.   THERAPIST PROGRESS NOTE     Session Time: 1:00 PM-1:30 PM   Participation Level: Active   Behavioral Response: Casual and Alert,Depressed   Type of Therapy: Individual Therapy   Treatment Goals addressed: Depression and Anxiety   Interventions: CBT   Summary: Kimberly Ortega is a 57 y.o. female who presents with Depression with Anxiety and Panic Disorder. The OPT therapist worked with the patient for her ongoing OPT treatment. The OPT therapist utilized Motivational Interviewing to assist in creating therapeutic repore. The patient in the session was engaged and work in collaboration giving feedback about her triggers and symptoms over the past few weeks. The patient spoke about her ongoing health struggles with migraines,sciatica, pain in throat and sleep. The patient spoke about her back surgery completed in May. The OPT therapist utilized Cognitive Behavioral Therapy through cognitive restructuring as well as worked with the patient on coping strategies to assist in management of mood. The OPT therapist overviewed in session with the patient basic need areas examining the patients current eating habits, sleep schedule, exercise, and hygiene. The OPT therapist overviewed with the patient her experiences post back surgery. The patient notes her anxiety and pain post surgery have been difficult and she has been taking pain medication and nausea medicine post surgery. The patient will be going back for her follow up post surgery next week.    Suicidal/Homicidal:  Nowithout intent/plan   Therapist Response:The OPT therapist worked with the patient for the patients scheduled session. The patient was engaged in her session and gave feedback in relation to triggers, symptoms, and behavior responses over the past few weeks. The OPT therapist worked with the patient utilizing an in session Cognitive Behavioral Therapy exercise. The patient was responsive in the session and verbalized, " I called in to see if they could work me in and they added me in today because I was just having a panic attack this morning and I needed someone to talk too".  The OPT therapist overviewed with the patient her support  post her recent surgery.The OPT therapist worked with the patient to challenge her negative thoughts and empower her to continue to work with her health providers in working her health problems and getting to desired outcomes. The OPT therapist worked with the patient on importance of utilizing coping skills and consistency with the recommendations ongoing of her physical health providers The patient has a follow up with her physical health provider next week as the follow up from her back surgery.The OPT therapist will continue treatment work with the patient in her next scheduled session.   Plan: Return again in 2/3 weeks.   Diagnosis:      Axis I: Recurrent Moderate Major Depressive Disorder with Anxiety. Panic Disorder    Axis II: No diagnosis      Collaboration of Care: No additional collaboration of care for this session.   Patient/Guardian was advised Release of Information must be obtained prior to any record release in order to collaborate their care with an outside provider. Patient/Guardian was advised if they have not already  done so to contact the registration department to sign all necessary forms in order for [name] to release information regarding their care.    Consent: Patient/Guardian gives verbal consent for treatment and assignment of benefits for  services provided during this visit. Patient/Guardian expressed understanding and agreed to proceed.    I discussed the assessment and treatment plan with the patient. The patient was provided an opportunity to ask questions and all were answered. The patient agreed with the plan and demonstrated an understanding of the instructions.   The patient was advised to call back or seek an in-person evaluation if the symptoms worsen or if the condition fails to improve as anticipated.   I provided 30 minutes of non-face-to-face time during this encounter.   [name], LCSW   01/25/2023

## 2023-03-01 ENCOUNTER — Ambulatory Visit (INDEPENDENT_AMBULATORY_CARE_PROVIDER_SITE_OTHER): Payer: Self-pay | Admitting: Gastroenterology

## 2023-03-01 NOTE — Progress Notes (Cosign Needed)
GI Office Note    Referring Provider: Ignatius Specking, MD Primary Care Physician:  Ignatius Specking, MD Primary Gastroenterologist: Dolores Frame, MD  Date:  03/02/2023  ID:  Kimberly Ortega, DOB 07-21-1966, MRN [id number]  Chief Complaint   Chief Complaint  Patient presents with   Follow-up    Patient here today for a follow up after 02/02/2023 Egd. Patient wants to discuss banding and possible banding today.   History of Present Illness  Kimberly Ortega is a 57 y.o. female with a history of GERD, gastroparesis, left-sided abdominal pain, recurrent UTIs, anxiety, OSA, PCOS, HTN presenting today for follow-up of abdominal pain.  EGD July 2010: -Procedure report unavailable -Biopsy negative for H. pylori.    EGD July 2018: -Normal esophagus s/p biopsy -Large amount of food residue in the stomach with patent pylorus suggestive of gastroparesis -Gastritis s/p biopsy -Normal duodenum -Advised metoclopramide 10 mg 3 times daily before meals   Colonoscopy in December 2021 with [facility]: Report unavailable in Care Everywhere.  Dr. Dareen Piano.    Pelvic ultrasound 12/10/2022: -Study limited particularly in the endovaginal imaging, however felt as though IUD is in the endometrial canal -X-ray of the pelvis to confirm presence of IUD and pelvis. -Left ovary not visualized -Limited evaluation of right ovary and uterus is otherwise unremarkable  Last office visit 12/31/2022.  Patient reported pain in the epigastric region in the evenings as well as occasional pain in other different areas of her lower abdomen.  She states she feels like there are bumps or sores in her throat.  Has tried Protonix, omeprazole, Nexium, lansoprazole, and Aciphex.  Seen by ENT who told her EGD should be done to screen for cancer (Barrett's).  Reportedly with Lap-Band about 10 years prior and developed subsequent erosion.  Takes Pepcid nightly and Dexilant daily in the mornings.  Reportedly need for back surgery but  has gained weight from being inactive.  Used to walk multiple miles per day.  Chest pains likely secondary to panic disorder.  At times pain is described as 9/10.  Gas-X helps with gas pain.  Denied any change in bowel habits.  Reportedly had hernia repair at time of Lap-Band surgery.  Reportedly waiting to start back Ozempic.  She is scheduled for an upper endoscopy and CT A/P performed given abdominal pain.  Advised to resume Dexilant if Voquezna not helpful given Voquezna samples were provided.   CT A/P 01/07/2023: -No hepatic masses -Prior cholecystectomy -No biliary obstruction or dilation -Normal pancreas and spleen -No bowel obstruction, inflammatory process, or abnormal fluid collection -IUD in appropriate position.  EGD 02/02/2023: -1 cm hiatal hernia -Normal stomach s/p biopsy -Normal duodenum s/p biopsy -Repeat EGD in 3 years for surveillance -Path: Moderate chronic inactive gastritis, negative for H. pylori -Patient advised to continue Dexilant  Today: GERD/gastroparesis - Reflux not doing well. Dexilant ids the only thing that works for her and it has to be name brand., Needs appeal sent to [employer] to get approved for the name brand. Voquezna was not helpful. GERD can wake her up and night and wake up coughing and feeling like choking. Takes zofran when she has her migraines (ubelvry and CPAP). Avoids spicy foods. Does occasionally eat fried foods. She can taste in on her tongue and has bad taste.    Has been on ozempic. Had back surgery earlier this month. Opioids make her sick. Has not taken any pepcid. Out of the dexilant right now.   Hemorrhoids - No constipation and does  not strain. GYN had told her that she had some hemorrhoids. Had a recent yeast infection in her groin. GYN told her that she had an external hemorrhoid. She said she knows for sure that she has some. Sometimes lack of bowel movements comes for lack of eating. Does not spend a long time on the commode. Uses  flushable wipes as well to help with cleaning.   Current Outpatient Medications  Medication Sig Dispense Refill   acetaminophen (TYLENOL) 500 MG tablet Take 1,000 mg by mouth every 6 (six) hours as needed for moderate pain.     ALPRAZolam (XANAX) 1 MG tablet Take 1 mg by mouth in the morning, at noon, and at bedtime.     ascorbic acid (VITAMIN C) 500 MG tablet Take 500 mg by mouth daily.     Biotin w/ Vitamins C & E (HAIR/SKIN/NAILS PO) Take 1 tablet by mouth daily.     botulinum toxin Type A (BOTOX) 200 units injection FOR OFFICE ADMINISTRATION. PHYSICIAN TO INJECT 155 UNITS INTRAMUSCULARLY INTO MUSCLES OF HEAD & NECK EVERY 12 WEEKS.  DISCARD REMAINDER. 1 each 3   cyclobenzaprine (FLEXERIL) 10 MG tablet Take 1 tablet (10 mg total) by mouth 3 (three) times daily as needed for muscle spasms. 30 tablet 0   famotidine (PEPCID) 20 MG tablet Take 1 tablet (20 mg total) by mouth at bedtime as needed for heartburn or indigestion. (Patient taking differently: Take 20 mg by mouth at bedtime.)     fluconazole (DIFLUCAN) 200 MG tablet Take 200 mg by mouth as needed. For yeast     metFORMIN (GLUCOPHAGE) 1000 MG tablet Take 1,000 mg by mouth daily. 02/08/23- ha not taken in 2 weeks.     MONUROL 3 g PACK Take 3 g by mouth daily as needed (UTI).  0   MULTIPLE VITAMIN PO Take 1 tablet by mouth daily.     NON FORMULARY Pt uses a cpap nighlty     nystatin cream (MYCOSTATIN) Apply 1 Application topically daily as needed (yeast).     ondansetron (ZOFRAN-ODT) 4 MG disintegrating tablet DISSOLVE 1 TABLET(4 MG) ON THE TONGUE EVERY 8 HOURS AS NEEDED FOR NAUSEA OR VOMITING 20 tablet 0   oxycodone (OXY-IR) 5 MG capsule Take 1 capsule (5 mg total) by mouth every 4 (four) hours as needed for pain. 30 capsule 0   OZEMPIC, 1 MG/DOSE, 4 MG/3ML SOPN Inject 1 mg into the skin once a week. 02/08/23 Has not taken in over a month.     Probiotic Product (ALIGN) CHEW Chew 1 capsule by mouth daily.     Ubrogepant (UBRELVY) 100 MG TABS  Take 1 tablet (100 mg total) by mouth as needed. 10 tablet 5   VITAMIN A PO Take 1 tablet by mouth daily.     dexlansoprazole (DEXILANT) 60 MG capsule TAKE 1 CAPSULE(60 MG) BY MOUTH DAILY (Patient not taking: Reported on 03/02/2023) 90 capsule 3   No current facility-administered medications for this visit.    Past Medical History:  Diagnosis Date   Abdominal pain    Allergies    Anemia    Anxiety    Arthritis    Cancer (HCC)    Complication of anesthesia 1991   dizziness; almost passed out, post op   Dyspnea    Family history of breast cancer    Cousins on both sides of family   GERD (gastroesophageal reflux disease) 03/01/2017   History of melanoma    History of recurrent UTIs  Migraine    Multiple nevi    Obstructive sleep apnea    Panic disorder 1995   Polycystic ovarian disease     Past Surgical History:  Procedure Laterality Date   BIOPSY  04/09/2017   Procedure: BIOPSY;  Surgeon: Malissa Hippo, MD;  [facility]: [facility];  Service: Endoscopy;;  gastric esophageal    BIOPSY  02/02/2023   Procedure: BIOPSY;  Surgeon: Dolores Frame, MD;  Location: [facility];  Service: Gastroenterology;;   CHOLECYSTECTOMY  1991   COLONOSCOPY  2016   DILATION AND CURETTAGE OF UTERUS     ESOPHAGOGASTRODUODENOSCOPY N/A 04/09/2017   Procedure: ESOPHAGOGASTRODUODENOSCOPY (EGD);  Surgeon: Malissa Hippo, MD;  [facility]: [facility];  Service: Endoscopy;  Laterality: N/A;  9:25   ESOPHAGOGASTRODUODENOSCOPY (EGD) WITH PROPOFOL N/A 02/02/2023   Procedure: ESOPHAGOGASTRODUODENOSCOPY (EGD) WITH PROPOFOL;  Surgeon: Dolores Frame, MD;  [facility]: [facility];  Service: Gastroenterology;  Laterality: N/A;  2:30 pm, moved to 5/14 per Tanya   EYE SURGERY Left 10/23/2021   LAPAROSCOPIC GASTRIC BANDING  07/07/2011   LAPAROSCOPIC REPAIR AND REMOVAL OF GASTRIC BAND     LUMBAR LAMINECTOMY/ DECOMPRESSION WITH MET-RX Right 02/09/2023   Procedure: Right Lumbar four-five  Minimally Invasive Laminectomy, Discectomy;  Surgeon: Jadene Pierini, MD;  Location: [facility];  Service: Neurosurgery;  Laterality: Right;   UPPER GI ENDOSCOPY N/A 11/21/2012   Procedure: UPPER GI ENDOSCOPY [name] DIAGNOSTIC LAPAROSCOPY /REMOVAL LAP GASTRIC BAND/INSERTION OF DRAIN;  Surgeon: Valarie Merino, MD;  Location: [facility];  Service: General;  Laterality: N/A;    Family History  Problem Relation Age of Onset   Cervical cancer Mother    Stroke Mother    Aneurysm Mother    Cervical cancer Sister    Other Paternal Grandmother        had part of colon removed   Breast cancer Cousin        Both sides of family   Cancer - Colon Paternal Uncle     Allergies as of 03/02/2023 - Review Complete 03/02/2023  Allergen Reaction Noted   Ibuprofen Shortness Of Breath and Other (See Comments) 04/06/2017   Macrobid [nitrofurantoin macrocrystal] Shortness Of Breath 03/01/2017   Baclofen Other (See Comments) 05/13/2022   Morphine and codeine Nausea Only 11/21/2012   Topiramate Other (See Comments) 03/21/2020   Tramadol  03/11/2016   Latex Rash 02/09/2023   Other Palpitations 03/13/2020   Pseudoephedrine Palpitations 01/26/2023    Social History   Socioeconomic History   Marital status: Married    Spouse name: Not on file   Number of children: 1   Years of education: Not on file   Highest education level: Some college, no degree  Occupational History    Comment: na  Tobacco Use   Smoking status: Never   Smokeless tobacco: Never  Vaping Use   Vaping Use: Never used  Substance and Sexual Activity   Alcohol use: Yes    Comment: wine/liquor maybe once a month   Drug use: No   Sexual activity: Yes    Birth control/protection: I.U.D.  Other Topics Concern   Not on file  Social History Narrative   Lives with spouse   Caffeine- none   Social Determinants of Health   Financial Resource Strain: Not on file  Food Insecurity: Not on file  Transportation Needs: Not on file   Physical Activity: Not on file  Stress: Not on file  Social Connections: Not on file  Review of Systems   Gen: Denies fever, chills, anorexia. Denies fatigue, weakness, weight loss.  CV: Denies chest pain, palpitations, syncope, peripheral edema, and claudication. Resp: Denies dyspnea at rest, cough, wheezing, coughing up blood, and pleurisy. GI: See HPI Derm: Denies rash, itching, dry skin Psych: Denies depression, anxiety, memory loss, confusion. No homicidal or suicidal ideation.  Heme: Denies bruising, bleeding, and enlarged lymph nodes.   Physical Exam   BP 106/81 (BP Location: Left Arm, Patient Position: Sitting, Cuff Size: Large)   Pulse 96   Temp 97.8 F (36.6 C) (Temporal)   Ht 5\' 7"  (1.702 m)   Wt 251 lb 4.8 oz (114 kg)   BMI 39.36 kg/m   General:   Alert and oriented. No distress noted. Pleasant and cooperative.  Head:  Normocephalic and atraumatic. Eyes:  Conjuctiva clear without scleral icterus. Mouth:  Oral mucosa pink and moist. Good dentition. No lesions. Lungs:  Clear to auscultation bilaterally. No wheezes, rales, or rhonchi. No distress.  Heart:  S1, S2 present without murmurs appreciated.  Abdomen:  +BS, soft, non-tender and non-distended. No rebound or guarding. No HSM or masses noted. Rectal: see below for Banding note.  Msk:  Symmetrical without gross deformities. Normal posture. Extremities:  Without edema. Neurologic:  Alert and  oriented x4 Psych:  Alert and cooperative. Normal mood and affect.   Assessment  Kimberly Ortega is a 56 y.o. female with a history of GERD, gastroparesis, left-sided abdominal pain, recurrent UTIs, anxiety, OSA, PCOS, HTN presenting today for follow-up of GERD and abdominal pain.  GERD/gastroparesis/gastritis: Previously reported intermittent epigastric discomfort and feeling of bumps or sores present in her throat.  Previously evaluated by ENT who felt as though EGD was needed. Recent EGD with evidence of mild chronic  inactive gastritis on pathology.  Pathology also negative for H. pylori.  She was advised to continue on Dexilant 60 mg once daily. Has been without name brand Dexilant and has requested PA/appeal be submitted to get approved for name brand Dexilant. Voquezna samples were not helpful and continues to have nocturnal symptoms. Discussed possibility of TIF if she could achieve some weight loss. For more immediate relief of reflux will provide carafate 1g QID.   Hemorrhoids: No rectal bleeding. Does have discomfort with her hemorrhoids with pressure and itching and she would like to have them banded. Denies any constipation.   [facility] Banding Procedure Note:   The patient presents with symptomatic grade 1 hemorrhoids, unresponsive to maximal medical therapy, requesting rubber band ligation of his/her hemorrhoidal disease. All risks, benefits, and alternative forms of therapy were described and informed consent was obtained.  In the left lateral decubitus position anoscopic examination revealed grade 1 hemorrhoids in the right posterior and right anterior positions  The decision was made to band the right posterior internal hemorrhoid, and the [facility] O'Regan System was used to perform band ligation without complication. Digital anorectal examination was then performed to assure proper positioning of the band, and to adjust the banded tissue as required. The patient was discharged home without pain or other issues. Dietary and behavioral recommendations were given and (if necessary prescriptions were given), along with follow-up instructions. The patient will return in 2 weeks for additional banding as required.  No complications were encountered and the patient tolerated the procedure well.   PLAN   Continue Dexilant 60 mg once daily Carafate 1g QID Would like to discuss TIF with Dr. Levon Hedger. (Set up pone call).   Monitor for rectal bleeding Follow up  in 2 weeks for additional banding.  Will have staff  draft letter for insurance appeal to obtain name brand Dexilant.    [name], MSN, FNP-BC, AGACNP-BC [facility]  I have reviewed the note and agree with the APP's assessment as described in this progress note  Patient is not a candidate teeth at this moment given her BMI is more than 35.  She can try losing some weight and we can readdress this in the future.  Will appeal Dexilant   Katrinka Blazing, MD Gastroenterology and Hepatology [facility]

## 2023-03-02 ENCOUNTER — Encounter: Payer: Self-pay | Admitting: Gastroenterology

## 2023-03-02 ENCOUNTER — Ambulatory Visit (INDEPENDENT_AMBULATORY_CARE_PROVIDER_SITE_OTHER): Payer: 59 | Admitting: Gastroenterology

## 2023-03-02 VITALS — BP 106/81 | HR 96 | Temp 97.8°F | Ht 67.0 in | Wt 251.3 lb

## 2023-03-02 DIAGNOSIS — R1084 Generalized abdominal pain: Secondary | ICD-10-CM

## 2023-03-02 DIAGNOSIS — K3184 Gastroparesis: Secondary | ICD-10-CM | POA: Diagnosis not present

## 2023-03-02 DIAGNOSIS — K640 First degree hemorrhoids: Secondary | ICD-10-CM

## 2023-03-02 DIAGNOSIS — K219 Gastro-esophageal reflux disease without esophagitis: Secondary | ICD-10-CM

## 2023-03-02 MED ORDER — SUCRALFATE 1 G PO TABS: 1.0000 g | ORAL_TABLET | Freq: Three times a day (TID) | ORAL | 0 refills | Status: DC

## 2023-03-02 MED ORDER — DEXLANSOPRAZOLE 60 MG PO CPDR
60.0000 mg | DELAYED_RELEASE_CAPSULE | Freq: Every day | ORAL | 3 refills | Status: DC
Start: 2023-03-02 — End: 2023-08-24

## 2023-03-02 NOTE — Patient Instructions (Signed)
Continue to avoid straining.  Limit toilet time to 2-3 minutes at the most.   Occasionally, you may have more bleeding than usual after the banding procedure. This is often from the untreated hemorrhoids rather than the treated one. Don't be concerned if there is a tablespoon or so of blood. If there is more blood than this, lie flat with your bottom higher than your head and apply an ice pack to the area. If the bleeding does not stop within a half an hour or if you feel faint, have severe pain, chills, fever or difficulty passing urine (very rare) or other problems, you should call us at [phone] or report to the nearest emergency room. Please call me with any concerns!  The procedure you have had should have been relatively painless since the banding of the area involved does not have nerve endings and there is no pain sensation. The rubber band cuts off the blood supply to the hemorrhoid and the band may fall off as soon as 48 hours after the banding (the band may occasionally be seen in the toilet bowl following a bowel movement). You may notice a temporary feeling of fullness in the rectum which should respond adequately to plain Tylenol or Motrin.  I will see you back in 2 weeks for follow-up for additional banding.  We will resubmit for name brand Dexilant. Take Carafate 1g QID for 3 weeks.   Will set up appointment with Dr. Levon Hedger to discuss TIF further.   [name], MSN, FNP-BC, AGACNP-BC [facility]

## 2023-03-12 ENCOUNTER — Ambulatory Visit (HOSPITAL_COMMUNITY): Payer: 59 | Admitting: Clinical

## 2023-03-15 NOTE — Progress Notes (Unsigned)
   [facility] Banding Procedure Note:   Malone Admire is a 57 y.o. female presenting today for consideration of hemorrhoid banding. Last colonoscopy in December 2021 with [facility].  Latex Allergy: Yes  Interval History: Concerned about ability to have hernia repair and/or TIF procedure given her severe acid reflux. She was able to obtain her name brand Dexilant. She has felt a little something on the left side of rectum.   The patient presents with symptomatic grade 2/3 hemorrhoids, unresponsive to maximal medical therapy, requesting rubber band ligation of his/her hemorrhoidal disease. All risks, benefits, and alternative forms of therapy were described and informed consent was obtained.  The decision was made to band the left lateral internal hemorrhoid, and the [facility] O'Regan System was used to perform band ligation without complication. Digital anorectal examination was then performed to assure proper positioning of the band, and to adjust the banded tissue as required. The patient was discharged home without pain or other issues. Dietary and behavioral recommendations were given and (if necessary prescriptions were given), along with follow-up instructions. The patient will return in 2 weeks  for additional banding as required.  No complications were encountered and the patient tolerated the procedure well.    [name], MSN, FNP-BC, AGACNP-BC [facility]

## 2023-03-16 ENCOUNTER — Encounter: Payer: Self-pay | Admitting: Gastroenterology

## 2023-03-16 ENCOUNTER — Ambulatory Visit (INDEPENDENT_AMBULATORY_CARE_PROVIDER_SITE_OTHER): Payer: 59 | Admitting: Gastroenterology

## 2023-03-16 VITALS — BP 121/85 | HR 97 | Temp 97.8°F | Ht 67.0 in | Wt 252.8 lb

## 2023-03-16 DIAGNOSIS — K641 Second degree hemorrhoids: Secondary | ICD-10-CM

## 2023-03-16 DIAGNOSIS — K642 Third degree hemorrhoids: Secondary | ICD-10-CM | POA: Diagnosis not present

## 2023-03-16 DIAGNOSIS — K640 First degree hemorrhoids: Secondary | ICD-10-CM

## 2023-03-16 NOTE — Patient Instructions (Addendum)
Continue to avoid straining. Limit toilet time to 2-3 minutes at the most.   Avoid constipation. Take 2 tablespoons of natural wheat bran, natural oat bran, flax, Benefiber or any over the counter fiber supplement and increase your water intake to 7-8 glasses daily.  Occasionally, you may have more bleeding than usual after the banding procedure. This is often from the untreated hemorrhoids rather than the treated one. Don't be concerned if there is a tablespoon or so of blood. If there is more blood than this, lie flat with your bottom higher than your head and apply an ice pack to the area. If the bleeding does not stop within a half an hour or if you feel faint, have severe pain, chills, fever or difficulty passing urine (very rare) or other problems, you should call us at [phone] or report to the nearest emergency room. Please call me with any concerns!  The procedure you have had should have been relatively painless since the banding of the area involved does not have nerve endings and there is no pain sensation. The rubber band cuts off the blood supply to the hemorrhoid and the band may fall off as soon as 48 hours after the banding (the band may occasionally be seen in the toilet bowl following a bowel movement). You may notice a temporary feeling of fullness in the rectum which should respond adequately to plain Tylenol or Motrin.  I will see you back in 2 weeks for additional banding.  I will discuss further with Dr. Levon Hedger regarding the TIF procedure.    [name], MSN, FNP-BC, AGACNP-BC [facility]

## 2023-03-17 ENCOUNTER — Telehealth (INDEPENDENT_AMBULATORY_CARE_PROVIDER_SITE_OTHER): Payer: Self-pay | Admitting: Gastroenterology

## 2023-03-17 NOTE — Telephone Encounter (Signed)
I had a very thorough discussion with the patient for roughly 25 minutes in which we discussed her symptoms.  The patient reports that she is presenting frequent heartburn despite taking Dexilant compliantly.  She is very frustrated as she thought she was qualified for TIF procedure.  She is also frustrated as she states that she gained weight after her back surgery but she believes she will be able to lose the weight before July 31 (at that time she will have a new insurance and she has already met her deductible).  I explained to her that in order to proceed with TIF she should have a BMI of less than 35 as there is a high risk of treatment failure if the BMI is higher than this.  I also explained to her that she is she is having dysphagia and had evidence of dysmotility in her last esophagram in 2019, we will need to do further workup with manometry coupled with a pH impedance testing on PPI to confirm if she indeed has GERD.  The patient had multiple questions which I answered.  For now, she would like to reconsider her current symptoms and the need to have this further evaluated, she will get back to our office if she wants to proceed with manometry and pH impedance testing

## 2023-03-23 ENCOUNTER — Telehealth: Payer: Self-pay | Admitting: Family Medicine

## 2023-03-23 NOTE — Telephone Encounter (Signed)
Pt want to know how many Ubrogepant (UBRELVY) 100 MG TABS she can take in a day.

## 2023-03-23 NOTE — Telephone Encounter (Signed)
I called and spoke with patient and explained she can take max 200 mg in one day and it must be at least 2 hours after first dose.

## 2023-03-24 ENCOUNTER — Other Ambulatory Visit: Payer: Self-pay

## 2023-03-24 MED ORDER — ONDANSETRON 4 MG PO TBDP: ORAL_TABLET | ORAL | 0 refills | Status: DC

## 2023-03-29 ENCOUNTER — Telehealth: Payer: Self-pay | Admitting: Neurology

## 2023-03-29 ENCOUNTER — Encounter: Payer: Self-pay | Admitting: Family Medicine

## 2023-03-29 NOTE — Telephone Encounter (Signed)
Pt called wanting to know if it's okay to change appointment with Dr. Vickey Huger to [facility] for tomorrow. Pt is asking if she can get more than 10 pills of  Ubrogepant (UBRELVY) 100 MG TABS a month.

## 2023-03-30 ENCOUNTER — Telehealth: Payer: Self-pay | Admitting: Neurology

## 2023-03-30 ENCOUNTER — Telehealth (INDEPENDENT_AMBULATORY_CARE_PROVIDER_SITE_OTHER): Payer: 59 | Admitting: Neurology

## 2023-03-30 ENCOUNTER — Ambulatory Visit (INDEPENDENT_AMBULATORY_CARE_PROVIDER_SITE_OTHER): Payer: 59 | Admitting: Clinical

## 2023-03-30 ENCOUNTER — Encounter: Payer: Self-pay | Admitting: Neurology

## 2023-03-30 DIAGNOSIS — F331 Major depressive disorder, recurrent, moderate: Secondary | ICD-10-CM | POA: Diagnosis not present

## 2023-03-30 DIAGNOSIS — F5104 Psychophysiologic insomnia: Secondary | ICD-10-CM | POA: Diagnosis not present

## 2023-03-30 DIAGNOSIS — F419 Anxiety disorder, unspecified: Secondary | ICD-10-CM | POA: Diagnosis not present

## 2023-03-30 DIAGNOSIS — G4733 Obstructive sleep apnea (adult) (pediatric): Secondary | ICD-10-CM

## 2023-03-30 DIAGNOSIS — F410 Panic disorder [episodic paroxysmal anxiety] without agoraphobia: Secondary | ICD-10-CM | POA: Diagnosis not present

## 2023-03-30 NOTE — Telephone Encounter (Signed)
..   Pt understands that although there may be some limitations with this type of visit, we will take all precautions to reduce any security or privacy concerns.  Pt understands that this will be treated like an in office visit and we will file with pt's insurance, and there may be a patient responsible charge related to this service. ? ?

## 2023-03-30 NOTE — Patient Instructions (Signed)
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
## 2023-03-30 NOTE — Progress Notes (Addendum)
Dr Marjory Lies, Shawnie Dapper, NP   Virtual Visit via Video Note  I connected with Kimberly Ortega on 03/30/23 at  3:30 PM EDT by a video enabled telemedicine application and verified that I am speaking with the correct person using two identifiers.  Location: Patient: at home /  Provider: at the office.   I discussed the limitations of evaluation and management by telemedicine and the availability of in person appointments. The patient expressed understanding and agreed to proceed.  History of Present Illness:     Kimberly Ortega is a 57 y.o. female patient who is seen upon referral on 12/14/2022 from Amy Lomax/NP - Dr Marjory Lies  for a Sleep Consult.   The patient has back pain now for a year- and gained weight, is physically inactive, snores more. Night mares - moaning , groaning in her sleep- dreaming at the very onset of sleep- Obesity, chronic Migraine, snoring , hypnopompic hallucination, mind is racing, planned back surgery for sciatica soon.  Weight loss on optavia in 2019-2020  She is suffering from chronic insomnia.  She is suffering from anxiety and panic disorder.  Patient has been seen by Dr. Danae Orleans 03-20-2020 for new onset headache and again 08-28-2022 for a sciatica back pain, now further worked up by Dr. Maurice Small.  Had on May 21 st her expected  back surgery.  Epworth was 2/ 24 and FSS was  58/ 63 points (!)    Social history: not gainfully employed at this time, She has been in the midst of a divorce , and she is crying frequently which interfere with CPAP use.  She was  married for over 10 years. No children form this marriage, but a 76 year old child who lives far from her .    Observations/Objective:her HST documented a calculated pAHI (per hour) of  21.7/h                          REM pAHI: 50.4/h                                              NREM pAHI:    8.3/h                        Positional AHI:   Sleep was recorded exclusively in supine position.   CPAP  compliance :    (7 % by days and 87% by hours, averaging over 6 hours of use per night. 5-15 cm water, 2 cm EPR and residual AHI of 1.1/h. minimal air leak. 95% pressure was  11.5/h.                                            Assessment and Plan: Highly compliant CPAP user with significant control of OSA, reduction in residual apnea.   Still feels fatigued and sleepy,  and OSA treatment has not had an effect on migraines.  She has been chronic insomnic. (The divorce and separation has made it temporarily worse).    Follow Up Instructions: Follow up yearly with Shawnie Dapper, NP for CPAP compliance and Migraine treatment.   CC Dr Marjory Lies, MD .     I discussed the assessment and treatment  plan with the patient. The patient was provided an opportunity to ask questions and all were answered. The patient agreed with the plan and demonstrated an understanding of the instructions.   The patient was advised to call back or seek an in-person evaluation if the symptoms worsen or if the condition fails to improve as anticipated.  I provided 20 minutes of non-face-to-face time during this encounter.   Melvyn Novas, MD

## 2023-04-01 ENCOUNTER — Encounter: Payer: Self-pay | Admitting: Gastroenterology

## 2023-04-01 ENCOUNTER — Ambulatory Visit (INDEPENDENT_AMBULATORY_CARE_PROVIDER_SITE_OTHER): Payer: 59 | Admitting: Gastroenterology

## 2023-04-01 VITALS — BP 129/87 | HR 88 | Temp 98.0°F | Ht 67.0 in | Wt 250.0 lb

## 2023-04-01 DIAGNOSIS — R1030 Lower abdominal pain, unspecified: Secondary | ICD-10-CM

## 2023-04-01 DIAGNOSIS — K641 Second degree hemorrhoids: Secondary | ICD-10-CM

## 2023-04-01 NOTE — Progress Notes (Addendum)
   [facility] Banding Procedure Note:   Kimberly Ortega is a 57 y.o. female presenting today for consideration of hemorrhoid banding. Last colonoscopy in December 2021 with [facility]. .  Latex Allergy: yes  Interval History: Has appointment to see dermatology next week to assess spot near anal canal that is causing discomfort.  Overall her pressure and itching sensation has improved with her prior 2 bandings  The patient presents with symptomatic grade 2/3 hemorrhoids, unresponsive to maximal medical therapy, requesting rubber band ligation of his/her hemorrhoidal disease. All risks, benefits, and alternative forms of therapy were described and informed consent was obtained.  Upon inspection of perianal area it appears that she has about a pencil eraser sized skin lesion about 1-2 cm from the anal canal that she is reported has been causing her some discomfort.  Does not appear to have characteristics of a skin tag and is not consistent with an external hemorrhoid.  The decision was made to band the right anterior internal hemorrhoid, and the [facility] System was used to perform band ligation without complication. Digital anorectal examination was then performed to assure proper positioning of the band, and to adjust the banded tissue as required. The patient was discharged home without pain or other issues. Dietary and behavioral recommendations were given and (if necessary prescriptions were given), along with follow-up instructions. The patient will return in 2-3 weeks for follow up. Plan to perform anoscopy to recheck for any additional internal hemorrhoid tissue and perform additional banding if needed.   No complications were encountered and the patient tolerated the procedure well.    Addendum: After visit over patient queried regarding her intermittent upper and lower abdominal pains that concerns her. She has associated nausea with this. She does not feel as though it is gas because all otc medications  for this do not provide relief. No real correlation with meals. Has had some diarrhea lately as well but attributes this to her nerves given her life stress right now including her divorce.   Kimberly Bonito, MSN, FNP-BC, AGACNP-BC [facility]

## 2023-04-01 NOTE — Patient Instructions (Signed)
Continue to avoid straining. Limit toilet time to 2-3 minutes at the most. Avoid constipation. Take 2 tablespoons of natural wheat bran, natural oat bran, flax, Benefiber or any over the counter fiber supplement and increase your water intake to 7-8 glasses daily.  Occasionally, you may have more bleeding than usual after the banding procedure. This is often from the untreated hemorrhoids rather than the treated one. Don't be concerned if there is a tablespoon or so of blood. If there is more blood than this, lie flat with your bottom higher than your head and apply an ice pack to the area. If the bleeding does not stop within a half an hour or if you feel faint, have severe pain, chills, fever or difficulty passing urine (very rare) or other problems, you should call us at [phone] or report to the nearest emergency room. Please call me with any concerns!  The procedure you have had should have been relatively painless since the banding of the area involved does not have nerve endings and there is no pain sensation. The rubber band cuts off the blood supply to the hemorrhoid and the band may fall off as soon as 48 hours after the banding (the band may occasionally be seen in the toilet bowl following a bowel movement). You may notice a temporary feeling of fullness in the rectum which should respond adequately to plain Tylenol or Motrin.  I will see you back in 2 weeks for anoscopy and repeat banding if needed.   [name], MSN, FNP-BC, AGACNP-BC [facility]

## 2023-04-02 ENCOUNTER — Ambulatory Visit (HOSPITAL_COMMUNITY): Payer: 59 | Admitting: Clinical

## 2023-04-02 DIAGNOSIS — F331 Major depressive disorder, recurrent, moderate: Secondary | ICD-10-CM

## 2023-04-02 DIAGNOSIS — F419 Anxiety disorder, unspecified: Secondary | ICD-10-CM | POA: Diagnosis not present

## 2023-04-02 DIAGNOSIS — F410 Panic disorder [episodic paroxysmal anxiety] without agoraphobia: Secondary | ICD-10-CM | POA: Diagnosis not present

## 2023-04-02 NOTE — Progress Notes (Signed)
Virtual Visit via Video Note   I connected with [name] on 04/02/23 at 10:00 AM EST by a video enabled telemedicine application and verified that I am speaking with the correct person using two identifiers.   Location: Patient: Home Provider: Office   I discussed the limitations of evaluation and management by telemedicine and the availability of in person appointments. The patient expressed understanding and agreed to proceed.   THERAPIST PROGRESS NOTE     Session Time: 10:00 AM-10:45 AM   Participation Level: Active   Behavioral Response: Casual and Alert,Depressed   Type of Therapy: Individual Therapy   Treatment Goals addressed: Depression and Anxiety   Interventions: CBT   Summary: Kimberly Ortega is a 57 y.o. female who presents with Depression with Anxiety and Panic Disorder. The OPT therapist worked with the patient for her ongoing OPT treatment. The OPT therapist utilized Motivational Interviewing to assist in creating therapeutic repore. The patient in the session was engaged and work in collaboration giving feedback about her triggers and symptoms over the past few days. The patient spoke about the last few days emotionally with him leaving her and difficulty with anxiety and nausea. The OPT therapist utilized Cognitive Behavioral Therapy through cognitive restructuring as well as worked with the patient on coping strategies to assist in management of mood. The OPT therapist overviewed in session with the patient basic need areas examining the patients current eating habits, sleep schedule, exercise, and hygiene. The OPT therapist overviewed with the patient the grieving process and using her support system. The patient is currently awaiting a call to schedule a MRI for her back. The patient is currently looking for legal counsel post her husband leaving her, and sending her divorce papers. The OPT therapist overviewed with the patient steps to take should she go into crisis and  need evaluation for inpatient. The patients verbalized her understanding and willingness if needed to report to the local hospital.    Suicidal/Homicidal: Nowithout intent/plan   Therapist Response:The OPT therapist worked with the patient for the patients scheduled session. The patient was engaged in her session and gave feedback in relation to triggers, symptoms, and behavior responses over the past few weeks. The OPT therapist worked with the patient utilizing an in session Cognitive Behavioral Therapy exercise. The patient was responsive in the session and verbalized, " I just feel overwhelmed I have these health problems and I need to talk to a lawyer and I don't have any help".The OPT therapist worked with the patient to challenge her negative thoughts and empower her to continue to work with her health providers in working her health problems and getting to desired outcomes. The OPT therapist worked with the patient on importance of utilizing coping skills and consistency with the recommendations ongoing of her physical health providers.The OPT therapist reviewed the patients current support network .The OPT therapist overviewed with the patient should she go into crisis to report to nearest hospital for inpatient evaluation and treatment services. The patient spoke about her willingness if needed to call for emergency services and willingness to complete a inpatient evaluation if needed.The OPT therapist will continue treatment work with the patient in her next scheduled session.   Plan: Return again in 2/3 weeks.   Diagnosis:      Axis I: Recurrent Moderate Major Depressive Disorder with Anxiety. Panic Disorder    Axis II: No diagnosis      Collaboration of Care: No additional collaboration of care for this session.  Patient/Guardian was advised Release of Information must be obtained prior to any record release in order to collaborate their care with an outside provider. Patient/Guardian  was advised if they have not already done so to contact the registration department to sign all necessary forms in order for [name] to release information regarding their care.    Consent: Patient/Guardian gives verbal consent for treatment and assignment of benefits for services provided during this visit. Patient/Guardian expressed understanding and agreed to proceed.    I discussed the assessment and treatment plan with the patient. The patient was provided an opportunity to ask questions and all were answered. The patient agreed with the plan and demonstrated an understanding of the instructions.   The patient was advised to call back or seek an in-person evaluation if the symptoms worsen or if the condition fails to improve as anticipated.   I provided 45 minutes of non-face-to-face time during this encounter.   Kimberly Burn, LCSW   04/02/2023

## 2023-04-05 ENCOUNTER — Telehealth: Payer: Self-pay | Admitting: *Deleted

## 2023-04-05 NOTE — Telephone Encounter (Signed)
Pt called in. She would like to proceed with CT. Wants to go to [facility] as she is getting MRI done there this month and needs CT this month since her insurance runs out. Please advise regarding diagnosis. Thanks!

## 2023-04-05 NOTE — Addendum Note (Signed)
Addended by: Armstead Peaks on: 04/05/2023 11:48 AM   Modules accepted: Orders

## 2023-04-05 NOTE — Telephone Encounter (Signed)
Pt says she will be having her CT on 04/08/23. She has an upcoming appt on 04/15/23 for banding. She wants to know if she can do a follow up at that time or will it depend on the results to the CT. Please advise. Thank you

## 2023-04-05 NOTE — Telephone Encounter (Signed)
CT order faxed to [facility] scheduling.  PA done via [facility] and approved CPT Code 16109 Description: CT ABDOMEN & PELVIS W/ Authorization Number: [id number] Case Number: [id number] Review Date: 04/05/2023 11:43:57 AM Expiration Date: 05/20/2023 Status: Your case has been Approved.

## 2023-04-06 ENCOUNTER — Telehealth: Payer: Self-pay | Admitting: Family Medicine

## 2023-04-06 NOTE — Telephone Encounter (Signed)
I submitted new auth via CMM and received another denial back. I submitted an appeal request on 03/30/23. I haven't received any updates so I called [employer] appeals at [phone]. The rep informed me that status is still pending but a decision should be made within the next few days.   I will be out of the office until Tuesday, 7/23. Sending this note to the pod to see if they can keep an eye out for approval over the next few days. We have already received Botox from [facility]. Key for [facility] is: [id number]. If decision has not been made when I come back on 7/23, I will r/s the patient.

## 2023-04-06 NOTE — Telephone Encounter (Signed)
Per Florentina Addison rn leave in box

## 2023-04-06 NOTE — Telephone Encounter (Signed)
PA was submitted and an appeal is currently pending. The appeal # is [id number]. I verified this with [employer] an hour ago and can see the auth on cover my meds.

## 2023-04-06 NOTE — Telephone Encounter (Signed)
Pt called along with [employer] and her insurance company. Stated a PA was never submitted to her insurance and she wants to know why she was told a PA for Botox has been submitted.

## 2023-04-07 ENCOUNTER — Telehealth: Payer: Self-pay | Admitting: Internal Medicine

## 2023-04-07 NOTE — Telephone Encounter (Signed)
Pt informed of providers message. Verbalized understanding. 

## 2023-04-07 NOTE — Telephone Encounter (Signed)
 LMTRC

## 2023-04-07 NOTE — Telephone Encounter (Signed)
Patient left a message that she was returning a call

## 2023-04-07 NOTE — Telephone Encounter (Signed)
See previous TE

## 2023-04-08 NOTE — Progress Notes (Unsigned)
04/13/23 ALL: Kimberly Ortega returns for Botox. She is doing well on therapy. Kimberly Ortega works well. She was started on CPAP therapy but unsure if it has been beneficial. Compliance review with Dr Vickey Huger 03/30/2023 showed excellent compliance and residual AHI well managed.   01/18/2023 ALL: Kimberly Ortega returns for Botox. She reports doing better. Averaging about 6 migraine days a month. Kimberly Ortega does seem to help. She is averaging 3-4 doses per month. Rarely using rizatriptan.   10/22/2022 ALL: Kimberly Ortega returns for Botox.She reports migraines have improved by at least 50%. Nurtec did not seem much more effective than rizatriptan.   07/27/2022 ALL: Kimberly Ortega presents for her first Botox procedure. Baseline 15 migraine days per month. She was previously taking amitriptyline 20mg  at bedtime and propranolol LA 60mg  daily. She discontinued both propranolol and amitriptyline a few weeks ago. She continues rizatriptan as needed.     Consent Form Botulism Toxin Injection For Chronic Migraine    Reviewed orally with patient, additionally signature is on file:  Botulism toxin has been approved by the Federal drug administration for treatment of chronic migraine. Botulism toxin does not cure chronic migraine and it may not be effective in some patients.  The administration of botulism toxin is accomplished by injecting a small amount of toxin into the muscles of the neck and head. Dosage must be titrated for each individual. Any benefits resulting from botulism toxin tend to wear off after 3 months with a repeat injection required if benefit is to be maintained. Injections are usually done every 3-4 months with maximum effect peak achieved by about 2 or 3 weeks. Botulism toxin is expensive and you should be sure of what costs you will incur resulting from the injection.  The side effects of botulism toxin use for chronic migraine may include:   -Transient, and usually mild, facial weakness with facial  injections  -Transient, and usually mild, head or neck weakness with head/neck injections  -Reduction or loss of forehead facial animation due to forehead muscle weakness  -Eyelid drooping  -Dry eye  -Pain at the site of injection or bruising at the site of injection  -Double vision  -Potential unknown long term risks   Contraindications: You should not have Botox if you are pregnant, nursing, allergic to albumin, have an infection, skin condition, or muscle weakness at the site of the injection, or have myasthenia gravis, Lambert-Eaton syndrome, or ALS.  It is also possible that as with any injection, there may be an allergic reaction or no effect from the medication. Reduced effectiveness after repeated injections is sometimes seen and rarely infection at the injection site may occur. All care will be taken to prevent these side effects. If therapy is given over a long time, atrophy and wasting in the muscle injected may occur. Occasionally the patient's become refractory to treatment because they develop antibodies to the toxin. In this event, therapy needs to be modified.  I have read the above information and consent to the administration of botulism toxin.    BOTOX PROCEDURE NOTE FOR MIGRAINE HEADACHE  Contraindications and precautions discussed with patient(above). Aseptic procedure was observed and patient tolerated procedure. Procedure performed by Shawnie Dapper, FNP-C.   The condition has existed for more than 6 months, and pt does not have a diagnosis of ALS, Myasthenia Gravis or Lambert-Eaton Syndrome.  Risks and benefits of injections discussed and pt agrees to proceed with the procedure.  Written consent obtained  These injections are medically necessary. Pt  receives good benefits  from these injections. These injections do not cause sedations or hallucinations which the oral therapies may cause.   Description of procedure:  The patient was placed in a sitting position. The  standard protocol was used for Botox as follows, with 5 units of Botox injected at each site:  -Procerus muscle, midline injection  -Corrugator muscle, bilateral injection  -Frontalis muscle, bilateral injection, with 2 sites each side, medial injection was performed in the upper one third of the frontalis muscle, in the region vertical from the medial inferior edge of the superior orbital rim. The lateral injection was again in the upper one third of the forehead vertically above the lateral limbus of the cornea, 1.5 cm lateral to the medial injection site.  -Temporalis muscle injection, 4 sites, bilaterally. The first injection was 3 cm above the tragus of the ear, second injection site was 1.5 cm to 3 cm up from the first injection site in line with the tragus of the ear. The third injection site was 1.5-3 cm forward between the first 2 injection sites. The fourth injection site was 1.5 cm posterior to the second injection site. 5th site laterally in the temporalis  muscleat the level of the outer canthus.  -Occipitalis muscle injection, 3 sites, bilaterally. The first injection was done one half way between the occipital protuberance and the tip of the mastoid process behind the ear. The second injection site was done lateral and superior to the first, 1 fingerbreadth from the first injection. The third injection site was 1 fingerbreadth superiorly and medially from the first injection site.  -Cervical paraspinal muscle injection, 2 sites, bilaterally. The first injection site was 1 cm from the midline of the cervical spine, 3 cm inferior to the lower border of the occipital protuberance. The second injection site was 1.5 cm superiorly and laterally to the first injection site.  -Trapezius muscle injection was performed at 3 sites, bilaterally. The first injection site was in the upper trapezius muscle halfway between the inflection point of the neck, and the acromion. The second injection site was  one half way between the acromion and the first injection site. The third injection was done between the first injection site and the inflection point of the neck.   Will return for repeat injection in 3 months.   A total of 200 units of Botox was prepared, 155 units of Botox was injected as documented above, any Botox not injected was wasted. The patient tolerated the procedure well, there were no complications of the above procedure.

## 2023-04-12 ENCOUNTER — Other Ambulatory Visit: Payer: Self-pay | Admitting: Neurological Surgery

## 2023-04-13 ENCOUNTER — Ambulatory Visit (INDEPENDENT_AMBULATORY_CARE_PROVIDER_SITE_OTHER): Payer: 59 | Admitting: Family Medicine

## 2023-04-13 DIAGNOSIS — G43109 Migraine with aura, not intractable, without status migrainosus: Secondary | ICD-10-CM

## 2023-04-13 MED ORDER — ONABOTULINUMTOXINA 200 UNITS IJ SOLR
155.0000 [IU] | Freq: Once | INTRAMUSCULAR | Status: AC
Start: 2023-04-13 — End: 2023-04-13
  Administered 2023-04-13: 155 [IU] via INTRAMUSCULAR

## 2023-04-13 NOTE — Progress Notes (Signed)
Botox- 200 units x 1 vial Lot: [id number] Expiration:04/2025 NDC: 0023-3921-02  Bacteriostatic 0.9% Sodium Chloride- * mL  Lot: [id number] Expiration: 07/22/2024 NDC: 0102-7253-66  Dx:G43.109 S/P Witnessed by: BOTOX BY KHADIJAH SQUIRE,CMA SODIUM CHLORIDE BY Haze Boyden, RN

## 2023-04-13 NOTE — Telephone Encounter (Signed)
Received notification of appeal overturn, Botox has been approved.  Case #: [id number] (03/23/23-07/08/23)

## 2023-04-14 ENCOUNTER — Encounter (HOSPITAL_COMMUNITY): Payer: Self-pay | Admitting: Neurological Surgery

## 2023-04-14 ENCOUNTER — Other Ambulatory Visit: Payer: Self-pay

## 2023-04-14 NOTE — Progress Notes (Unsigned)
GI Office Note    Referring Provider: Ignatius Specking, MD Primary Care Physician:  Ignatius Specking, MD Primary Gastroenterologist: Dolores Frame, MD   Date:  04/15/2023  ID:  Kimberly Ortega, DOB Oct 03, 1965, MRN [id number]   Chief Complaint   Chief Complaint  Patient presents with   Follow-up    Patient here today for a follow up on her Hemorrhoids. Patient states she is feeling much better since having the banding.    History of Present Illness  Kimberly Ortega is a 56 y.o. female with a history of GERD, gastroparesis, left-sided abdominal pain, recurrent UTIs, anxiety, OSA, PCOS, HTN presenting today for follow-up of abdominal pain and post hemorrhoid banding.  EGD July 2010: -Procedure report unavailable -Biopsy negative for H. pylori.    EGD July 2018: -Normal esophagus s/p biopsy -Large amount of food residue in the stomach with patent pylorus suggestive of gastroparesis -Gastritis s/p biopsy -Normal duodenum -Advised metoclopramide 10 mg 3 times daily before meals   Colonoscopy in December 2021 with [facility]: Report unavailable in Care Everywhere.  Dr. Dareen Piano.    Pelvic ultrasound 12/10/2022: -Study limited particularly in the endovaginal imaging, however felt as though IUD is in the endometrial canal -X-ray of the pelvis to confirm presence of IUD and pelvis. -Left ovary not visualized -Limited evaluation of right ovary and uterus is otherwise unremarkable   Last office visit 12/31/2022.  Patient reported pain in the epigastric region in the evenings as well as occasional pain in other different areas of her lower abdomen.  She states she feels like there are bumps or sores in her throat.  Has tried Protonix, omeprazole, Nexium, lansoprazole, and Aciphex.  Seen by ENT who told her EGD should be done to screen for cancer (Barrett's).  Reportedly with Lap-Band about 10 years prior and developed subsequent erosion.  Takes Pepcid nightly and Dexilant daily in the mornings.   Reportedly need for back surgery but has gained weight from being inactive.  Used to walk multiple miles per day.  Chest pains likely secondary to panic disorder.  At times pain is described as 9/10.  Gas-X helps with gas pain.  Denied any change in bowel habits.  Reportedly had hernia repair at time of Lap-Band surgery.  Reportedly waiting to start back Ozempic.  She is scheduled for an upper endoscopy and CT A/P performed given abdominal pain.  Advised to resume Dexilant if Voquezna not helpful given Voquezna samples were provided.    CT A/P 01/07/2023: -No hepatic masses -Prior cholecystectomy -No biliary obstruction or dilation -Normal pancreas and spleen -No bowel obstruction, inflammatory process, or abnormal fluid collection -IUD in appropriate position.   EGD 02/02/2023: -1 cm hiatal hernia -Normal stomach s/p biopsy -Normal duodenum s/p biopsy -Repeat EGD in 3 years for surveillance -Path: Moderate chronic inactive gastritis, negative for H. pylori -Patient advised to continue Dexilant  Has underwent hemorrhoid banding x3 since last general follow up. On 7/11 patient reported ongoing abdominal pains therefore CT scan ordered.   CT A/P 04/06/23 Impression: -prominence of appendix measuring 6mm without inflammatory stranding.  -diffuse hepatic steatosis -no acute abnormality in abdomen of pelvis.   Today:  Gas ex used to help with gas pains/abdominal pains. At last visit she reported that her pain was increasing and did not feel as though it was gas and result of recent weight gain. He states pain was similar to when she was having erosions and issues with her lap band prior to it being removed.  Patient scheduled to undergo disc revision of her back 7/29.   Hemorrhoids better since bandings.  Denies any bleeding or itching.  Going to dermatology to have small growth removed from perianal area.  Does not feel as though Dexilant is super effective but it works better than  anything else that she has tried in the past.  Continues to have intermittent coughing as well as changes in her voice when acid reflux is very severe even despite not eating or drinking.  Has been under a lot of stress given her recent divorce process.  Continues to use Zofran as needed for any nausea.   Current Outpatient Medications  Medication Sig Dispense Refill   acetaminophen (TYLENOL) 500 MG tablet Take 1,000 mg by mouth every 6 (six) hours as needed for moderate pain.     ALPRAZolam (XANAX) 1 MG tablet Take 1 mg by mouth in the morning, at noon, in the evening, and at bedtime.     ascorbic acid (VITAMIN C) 500 MG tablet Take 500 mg by mouth daily.     Biotin w/ Vitamins C & E (HAIR/SKIN/NAILS PO) Take 1 tablet by mouth daily.     botulinum toxin Type A (BOTOX) 200 units injection FOR OFFICE ADMINISTRATION. PHYSICIAN TO INJECT 155 UNITS INTRAMUSCULARLY INTO MUSCLES OF HEAD & NECK EVERY 12 WEEKS.  DISCARD REMAINDER. 1 each 3   busPIRone (BUSPAR) 10 MG tablet Take 10 mg by mouth 2 (two) times daily.     cyclobenzaprine (FLEXERIL) 10 MG tablet Take 1 tablet (10 mg total) by mouth 3 (three) times daily as needed for muscle spasms. 30 tablet 0   dexlansoprazole (DEXILANT) 60 MG capsule Take 1 capsule (60 mg total) by mouth daily. 90 capsule 3   famotidine (PEPCID) 20 MG tablet Take 1 tablet (20 mg total) by mouth at bedtime as needed for heartburn or indigestion. (Patient taking differently: Take 20 mg by mouth at bedtime.)     ketoconazole (NIZORAL) 2 % cream Apply 1 Application topically 2 (two) times daily as needed for irritation.     KLAYESTA powder Apply 1 Application topically daily as needed (yeast).     levonorgestrel (MIRENA) 20 MCG/DAY IUD 1 each by Intrauterine route once.     metFORMIN (GLUCOPHAGE) 1000 MG tablet Take 1,000 mg by mouth daily as needed (When not on Ozempic). Patient not currently taking     MONUROL 3 g PACK Take 3 g by mouth daily as needed (UTI).  0   MULTIPLE  VITAMIN PO Take 1 tablet by mouth daily.     NON FORMULARY Pt uses a cpap nighlty     ondansetron (ZOFRAN-ODT) 4 MG disintegrating tablet DISSOLVE 1 TABLET(4 MG) ON THE TONGUE EVERY 8 HOURS AS NEEDED FOR NAUSEA OR VOMITING 20 tablet 0   Probiotic Product (ALIGN) CHEW Chew 1 capsule by mouth daily.     Propylene Glycol (SYSTANE COMPLETE) 0.6 % SOLN Place 1 drop into both eyes as needed (dry eyes).     sucralfate (CARAFATE) 1 g tablet Take 1 tablet (1 g total) by mouth 4 (four) times daily -  with meals and at bedtime. (Patient taking differently: Take 1 g by mouth 3 (three) times daily as needed (stomach issues).) 360 tablet 0   Ubrogepant (UBRELVY) 100 MG TABS Take 1 tablet (100 mg total) by mouth as needed. 10 tablet 5   VITAMIN A PO Take 1 tablet by mouth daily.     OZEMPIC, 1 MG/DOSE, 4 MG/3ML SOPN Inject 1  mg into the skin once a week. Patient not currently not taking (Patient not taking: Reported on 04/15/2023)     No current facility-administered medications for this visit.    Past Medical History:  Diagnosis Date   Abdominal pain    Allergies    Anemia    Anxiety    Arthritis    Cancer (HCC)    skin   Complication of anesthesia 1991   dizziness; almost passed out, post op   Depression    Dyspnea    with exertion   Family history of breast cancer    Cousins on both sides of family   Fatty liver    GERD (gastroesophageal reflux disease) 03/01/2017   History of hiatal hernia    History of melanoma    History of recurrent UTIs    Migraine    last botox injection 04-13-23   Multiple nevi    Obstructive sleep apnea    Panic disorder 1995   Polycystic ovarian disease     Past Surgical History:  Procedure Laterality Date   BIOPSY  04/09/2017   Procedure: BIOPSY;  Surgeon: Malissa Hippo, MD;  Location: [facility];  Service: Endoscopy;;  gastric esophageal    BIOPSY  02/02/2023   Procedure: BIOPSY;  Surgeon: Dolores Frame, MD;  Location: [facility];   Service: Gastroenterology;;   CHOLECYSTECTOMY  1991   COLONOSCOPY  2016   DILATION AND CURETTAGE OF UTERUS     ESOPHAGOGASTRODUODENOSCOPY N/A 04/09/2017   Procedure: ESOPHAGOGASTRODUODENOSCOPY (EGD);  Surgeon: Malissa Hippo, MD;  [facility]: [facility];  Service: Endoscopy;  Laterality: N/A;  9:25   ESOPHAGOGASTRODUODENOSCOPY (EGD) WITH PROPOFOL N/A 02/02/2023   Procedure: ESOPHAGOGASTRODUODENOSCOPY (EGD) WITH PROPOFOL;  Surgeon: Dolores Frame, MD;  Location: [facility];  Service: Gastroenterology;  Laterality: N/A;  2:30 pm, moved to 5/14 per Tanya   EYE SURGERY Left 10/23/2021   LAPAROSCOPIC GASTRIC BANDING  07/07/2011   banding removed but has two clamps   LAPAROSCOPIC REPAIR AND REMOVAL OF GASTRIC BAND     LUMBAR LAMINECTOMY/ DECOMPRESSION WITH MET-RX Right 02/09/2023   Procedure: Right Lumbar four-five Minimally Invasive Laminectomy, Discectomy;  Surgeon: Jadene Pierini, MD;  Location: [facility];  Service: Neurosurgery;  Laterality: Right;   UPPER GI ENDOSCOPY N/A 11/21/2012   Procedure: UPPER GI ENDOSCOPY [name] DIAGNOSTIC LAPAROSCOPY /REMOVAL LAP GASTRIC BAND/INSERTION OF DRAIN;  Surgeon: Valarie Merino, MD;  Location: [facility];  Service: General;  Laterality: N/A;    Family History  Problem Relation Age of Onset   Cervical cancer Mother    Stroke Mother    Aneurysm Mother    Cervical cancer Sister    Other Paternal Grandmother        had part of colon removed   Breast cancer Cousin        Both sides of family   Cancer - Colon Paternal Uncle     Allergies as of 04/15/2023 - Review Complete 04/15/2023  Allergen Reaction Noted   Ibuprofen Shortness Of Breath and Other (See Comments) 04/06/2017   Macrobid [nitrofurantoin macrocrystal] Shortness Of Breath 03/01/2017   Baclofen Other (See Comments) 05/13/2022   Morphine and codeine Nausea Only 11/21/2012   Topiramate Other (See Comments) 03/21/2020   Tramadol  03/11/2016   Latex Rash 02/09/2023    Other Palpitations 03/13/2020   Pseudoephedrine Palpitations 01/26/2023    Social History   Socioeconomic History   Marital status: Married    Spouse name: Not on  file   Number of children: 1   Years of education: Not on file   Highest education level: Some college, no degree  Occupational History    Comment: na  Tobacco Use   Smoking status: Never   Smokeless tobacco: Never  Vaping Use   Vaping status: Never Used  Substance and Sexual Activity   Alcohol use: Not Currently    Comment: wine/liquor maybe once a month   Drug use: No   Sexual activity: Yes    Birth control/protection: I.U.D.    Comment: mirena  Other Topics Concern   Not on file  Social History Narrative   Lives with spouse   Caffeine- none   Social Determinants of Health   Financial Resource Strain: Not on file  Food Insecurity: Not on file  Transportation Needs: Not on file  Physical Activity: Not on file  Stress: Not on file  Social Connections: Unknown (02/02/2022)   [facility] from [facility]   Social Network    Social Network: Not on file     Review of Systems   Gen: Denies fever, chills, anorexia. Denies fatigue, weakness, weight loss.  CV: Denies chest pain, palpitations, syncope, peripheral edema, and claudication. Resp: Denies dyspnea at rest, cough, wheezing, coughing up blood, and pleurisy. GI: See HPI  Derm: Denies rash, itching, dry skin Psych: + anxiety, depression. Denies memory loss, confusion. No homicidal or suicidal ideation.  Heme: Denies bruising, bleeding, and enlarged lymph nodes. + back pain   Physical Exam   BP 132/89 (BP Location: Left Arm, Patient Position: Sitting, Cuff Size: Large)   Pulse 97   Temp 97.8 F (36.6 C) (Temporal)   Ht 5\' 7"  (1.702 m)   Wt 248 lb 3.2 oz (112.6 kg)   BMI 38.87 kg/m   General:   Alert and oriented. No distress noted. Pleasant and cooperative.  Head:  Normocephalic and atraumatic. Eyes:  Conjuctiva clear without  scleral icterus. Mouth:  Oral mucosa pink and moist. Good dentition. No lesions. Lungs:  Clear to auscultation bilaterally. No wheezes, rales, or rhonchi. No distress.  Heart:  S1, S2 present without murmurs appreciated.  Abdomen:  +BS, soft, non-tender and non-distended. No rebound or guarding. No HSM or masses noted. Rectal: Anoscopy: No residual hemorrhoid tissue noted. Some mild healing ulceration noted form prior banding.  Good rectal tone.  Msk:  Symmetrical without gross deformities. Normal posture. Extremities:  Without edema. Neurologic:  Alert and  oriented x4 Psych:  Alert and cooperative. Normal mood and affect.   Assessment  Kimberly Ortega is a 57 y.o. female with a history of GERD, gastroparesis, left-sided abdominal pain, recurrent UTIs, anxiety, OSA, PCOS, HTN presenting today for follow-up of abdominal pain and hemorrhoids post banding.   GERD, gastroparesis: Maintained on Dexilant 60 mg once daily.  Continues to have intermittent epigastric discomfort.  EGD with mild chronic inactive gastritis on pathology, negative for H. pylori.  Has previously tried multiple other PPIs which have been ineffective. Will give Carafate for intermittent use for severe symptoms that are causing change in voice or frequent coughing.  Reviewed TIF procedure again today and that once she reaches an adequate BMI and weight that we will pursue preworkup with manometry/pH impedance and BPE.  She indicates her understanding of this and will continue to work toward weight loss after her upcoming back procedure.  Suspect stress is likely contributing largely to her increased reflux as well as her abdominal pain as well.  Abdominal pain: Still having  intermittent right lower and left lower quadrant abdominal pain usually lasting for several minutes and periodically throughout the day.  Pain does not improve with bowel movements and there is no associated urgency with bowel movements.  Suspect this could be  possibly related to her back pain that is radiating to her abdomen.  She is about to undergo revision of her most recent back surgery.  Used to have some mid epigastric pain that is related to her severe acid reflux and gastritis.  Hemorrhoids: Has underwent hemorrhoids banding x3.  No residual hemorrhoid tissue found on anoscopy today.  Denies any rectal bleeding.  Does have some mild discomfort back there today but nothing concerning.  PLAN   Continue Dexilant 60 mg once daily.  TIF pamphlet provided GERD diet Carafate suspension as needed fore severe refractory symptoms Follow up in 3 months    [name], MSN, FNP-BC, AGACNP-BC [facility]

## 2023-04-14 NOTE — Progress Notes (Signed)
PCP -  Mickeal Skinner, [facility]   Cardiologist - n/a GI - [name] NP Neurology - Dr. Porfirio Mylar Dohmier  Chest x-ray - 01-28-23 EKG - 01-28-23 Stress Test -  03-25-2016 ECHO - 03-02-16 Cardiac Cath - n/a  Sleep Study - yes (12-30-22) CPAP - yes  DM- Do Not take  metformin or Ozempic day of surgery.  Last dose was several months ago.  NPO  Anesthesia review: yes  Patient denies shortness of breath, fever, cough and chest pain via phone call.

## 2023-04-15 ENCOUNTER — Ambulatory Visit: Payer: 59 | Admitting: Gastroenterology

## 2023-04-15 ENCOUNTER — Encounter: Payer: Self-pay | Admitting: Gastroenterology

## 2023-04-15 ENCOUNTER — Ambulatory Visit (INDEPENDENT_AMBULATORY_CARE_PROVIDER_SITE_OTHER): Payer: 59 | Admitting: Gastroenterology

## 2023-04-15 VITALS — BP 132/89 | HR 97 | Temp 97.8°F | Ht 67.0 in | Wt 248.2 lb

## 2023-04-15 DIAGNOSIS — R1030 Lower abdominal pain, unspecified: Secondary | ICD-10-CM | POA: Diagnosis not present

## 2023-04-15 DIAGNOSIS — K3184 Gastroparesis: Secondary | ICD-10-CM | POA: Diagnosis not present

## 2023-04-15 DIAGNOSIS — K219 Gastro-esophageal reflux disease without esophagitis: Secondary | ICD-10-CM

## 2023-04-15 DIAGNOSIS — K641 Second degree hemorrhoids: Secondary | ICD-10-CM | POA: Diagnosis not present

## 2023-04-15 MED ORDER — SUCRALFATE 1 GM/10ML PO SUSP: 1.0000 g | Freq: Three times a day (TID) | ORAL | 0 refills | Status: DC

## 2023-04-15 NOTE — Patient Instructions (Signed)
Continue Dexilant 60 mg once daily.  Will sending liquid Carafate for you to keep on hand to use as needed for severe refractory symptoms.  Follow a GERD diet:  Avoid fried, fatty, greasy, spicy, citrus foods. Avoid caffeine and carbonated beverages. Avoid chocolate. Try eating 4-6 small meals a day rather than 3 large meals. Do not eat within 3 hours of laying down. Prop head of bed up on wood or bricks to create a 6 inch incline.  Good luck on your upcoming back surgery!   We will plan to follow-up in 3 months to see how you are doing and we can see where you are on the weight loss process and timing of possible TIF workup.  Will provide you with a pamphlet today for more information.  It was a pleasure to see you today. I want to create trusting relationships with patients. If you receive a survey regarding your visit,  I greatly appreciate you taking time to fill this out on paper or through your MyChart. I value your feedback.  [name], MSN, FNP-BC, AGACNP-BC [facility]

## 2023-04-16 ENCOUNTER — Other Ambulatory Visit: Payer: Self-pay | Admitting: Neurological Surgery

## 2023-04-16 ENCOUNTER — Ambulatory Visit (HOSPITAL_COMMUNITY): Payer: 59 | Admitting: Clinical

## 2023-04-16 DIAGNOSIS — F419 Anxiety disorder, unspecified: Secondary | ICD-10-CM | POA: Diagnosis not present

## 2023-04-16 DIAGNOSIS — F410 Panic disorder [episodic paroxysmal anxiety] without agoraphobia: Secondary | ICD-10-CM | POA: Diagnosis not present

## 2023-04-16 DIAGNOSIS — F331 Major depressive disorder, recurrent, moderate: Secondary | ICD-10-CM

## 2023-04-16 NOTE — Progress Notes (Signed)
Virtual Visit via Video Note   I connected with Kimberly Ortega on 04/16/23 at 8:00 AM EST by a video enabled telemedicine application and verified that I am speaking with the correct person using two identifiers.   Location: Patient: Home Provider: Office   I discussed the limitations of evaluation and management by telemedicine and the availability of in person appointments. The patient expressed understanding and agreed to proceed.   THERAPIST PROGRESS NOTE     Session Time: 8:00 AM-8:45 AM   Participation Level: Active   Behavioral Response: Casual and Alert,Depressed   Type of Therapy: Individual Therapy   Treatment Goals addressed: Depression and Anxiety   Interventions: CBT   Summary: Kimberly Ortega is a 57 y.o. female who presents with Depression with Anxiety and Panic Disorder. The OPT therapist worked with the patient for her ongoing OPT treatment. The OPT therapist utilized Motivational Interviewing to assist in creating therapeutic repore. The patient in the session was engaged and work in collaboration giving feedback about her triggers and symptoms over the past few days. The patient spoke about the last few days emotionally with on going separation from her husband with difficulty with anxiety and nausea. The patient noted additionally she is scheduled for another back surgery this coming Monday. The patient has learned the ex husband is seeing someone else in the past few days he went on vacation with another woman. The OPT therapist utilized Cognitive Behavioral Therapy through cognitive restructuring as well as worked with the patient on coping strategies to assist in management of mood. The OPT therapist overviewed in session with the patient basic need areas examining the patients current eating habits, sleep schedule, exercise, and hygiene. The OPT therapist overviewed with the patient the grieving process and using her support system. The patient noted she has changed the  locks at the home and she will be staying at the hospital as part of her surgery.    Suicidal/Homicidal: Nowithout intent/plan   Therapist Response:The OPT therapist worked with the patient for the patients scheduled session. The patient was engaged in her session and gave feedback in relation to triggers, symptoms, and behavior responses over the past few weeks. The OPT therapist worked with the patient utilizing an in session Cognitive Behavioral Therapy exercise. The patient was responsive in the session and verbalized, " I couldn't sleep last night I couldn't stop thinking he is out here living his best life and I am here and I am getting ready to have another surgery, and I heard from disability that it would be at least 6 months even if I get approved and I am worried because this is not my home and I know he wont let me stay here for 6 more months".The OPT therapist worked with the patient to challenge her negative thoughts and empower her to continue to work with her health providers in working her health problems and getting to desired outcomes. The OPT therapist worked with the patient on importance of utilizing coping skills and consistency with the recommendations ongoing of her physical health providers.The OPT therapist reviewed the patients current support network .The OPT therapist overviewed with the patient should she go into crisis to report to nearest hospital for inpatient evaluation and treatment services. The patient spoke about her willingness if needed to call for emergency services and willingness to complete a inpatient evaluation if needed.The OPT therapist will continue treatment work with the patient in her next scheduled session.   Plan: Return again in 2/3  weeks.   Diagnosis:      Axis I: Recurrent Moderate Major Depressive Disorder with Anxiety. Panic Disorder    Axis II: No diagnosis      Collaboration of Care: No additional collaboration of care for this session.    Patient/Guardian was advised Release of Information must be obtained prior to any record release in order to collaborate their care with an outside provider. Patient/Guardian was advised if they have not already done so to contact the registration department to sign all necessary forms in order for [name] to release information regarding their care.    Consent: Patient/Guardian gives verbal consent for treatment and assignment of benefits for services provided during this visit. Patient/Guardian expressed understanding and agreed to proceed.    I discussed the assessment and treatment plan with the patient. The patient was provided an opportunity to ask questions and all were answered. The patient agreed with the plan and demonstrated an understanding of the instructions.   The patient was advised to call back or seek an in-person evaluation if the symptoms worsen or if the condition fails to improve as anticipated.   I provided 45 minutes of non-face-to-face time during this encounter.   [name], LCSW   04/16/2023

## 2023-04-19 ENCOUNTER — Ambulatory Visit (HOSPITAL_COMMUNITY): Payer: 59

## 2023-04-19 ENCOUNTER — Observation Stay (HOSPITAL_COMMUNITY)
Admission: RE | Admit: 2023-04-19 | Discharge: 2023-04-20 | Disposition: A | Discharge status: Home / Self Care | Payer: 59 | Attending: Neurological Surgery | Admitting: Neurological Surgery

## 2023-04-19 ENCOUNTER — Encounter (HOSPITAL_COMMUNITY): Payer: Self-pay | Admitting: Neurological Surgery

## 2023-04-19 ENCOUNTER — Ambulatory Visit (HOSPITAL_BASED_OUTPATIENT_CLINIC_OR_DEPARTMENT_OTHER): Payer: 59 | Admitting: Physician Assistant

## 2023-04-19 ENCOUNTER — Other Ambulatory Visit: Payer: Self-pay

## 2023-04-19 ENCOUNTER — Ambulatory Visit (HOSPITAL_COMMUNITY): Payer: 59 | Admitting: Physician Assistant

## 2023-04-19 ENCOUNTER — Encounter (HOSPITAL_COMMUNITY)
Admission: RE | Disposition: A | Discharge status: Home / Self Care | Payer: Self-pay | Source: Home / Self Care | Attending: Neurological Surgery

## 2023-04-19 DIAGNOSIS — Z7984 Long term (current) use of oral hypoglycemic drugs: Secondary | ICD-10-CM | POA: Diagnosis not present

## 2023-04-19 DIAGNOSIS — M5116 Intervertebral disc disorders with radiculopathy, lumbar region: Secondary | ICD-10-CM

## 2023-04-19 DIAGNOSIS — Z85828 Personal history of other malignant neoplasm of skin: Secondary | ICD-10-CM | POA: Diagnosis not present

## 2023-04-19 DIAGNOSIS — G4733 Obstructive sleep apnea (adult) (pediatric): Secondary | ICD-10-CM

## 2023-04-19 DIAGNOSIS — M5416 Radiculopathy, lumbar region: Principal | ICD-10-CM | POA: Diagnosis present

## 2023-04-19 DIAGNOSIS — E119 Type 2 diabetes mellitus without complications: Secondary | ICD-10-CM | POA: Diagnosis not present

## 2023-04-19 HISTORY — DX: Fatty (change of) liver, not elsewhere classified: K76.0

## 2023-04-19 HISTORY — DX: Personal history of other diseases of the digestive system: Z87.19

## 2023-04-19 HISTORY — DX: Depression, unspecified: F32.A

## 2023-04-19 HISTORY — PX: LUMBAR LAMINECTOMY/ DECOMPRESSION WITH MET-RX: SHX5959

## 2023-04-19 LAB — COMPREHENSIVE METABOLIC PANEL
ALT: 31 U/L (ref 0–44)
AST: 25 U/L (ref 15–41)
Albumin: 3.7 g/dL (ref 3.5–5.0)
Alkaline Phosphatase: 83 U/L (ref 38–126)
Anion gap: 6 (ref 5–15)
BUN: 8 mg/dL (ref 6–20)
CO2: 31 mmol/L (ref 22–32)
Calcium: 9.2 mg/dL (ref 8.9–10.3)
Chloride: 103 mmol/L (ref 98–111)
Creatinine, Ser: 0.98 mg/dL (ref 0.44–1.00)
GFR, Estimated: >60 mL/min (ref >60)
Glucose, Bld: 105 mg/dL — ABNORMAL HIGH (ref 70–99)
Potassium: 4.1 mmol/L (ref 3.5–5.1)
Sodium: 140 mmol/L (ref 135–145)
Total Bilirubin: 0.4 mg/dL (ref 0.3–1.2)
Total Protein: 7.2 g/dL (ref 6.5–8.1)

## 2023-04-19 LAB — SURGICAL PCR SCREEN
MRSA, PCR: NEGATIVE
Staphylococcus aureus: NEGATIVE

## 2023-04-19 LAB — CBC
HCT: 43.4 % (ref 36.0–46.0)
Hemoglobin: 14 g/dL (ref 12.0–15.0)
MCH: 28.1 pg (ref 26.0–34.0)
MCHC: 32.3 g/dL (ref 30.0–36.0)
MCV: 87 fL (ref 80.0–100.0)
Platelets: 242 10*3/uL (ref 150–400)
RBC: 4.99 MIL/uL (ref 3.87–5.11)
RDW: 12.4 % (ref 11.5–15.5)
WBC: 8.3 10*3/uL (ref 4.0–10.5)
nRBC: 0 % (ref 0.0–0.2)

## 2023-04-19 SURGERY — LUMBAR LAMINECTOMY/ DECOMPRESSION WITH MET-RX
Anesthesia: General | Site: Spine Lumbar

## 2023-04-19 MED ORDER — ROCURONIUM BROMIDE 10 MG/ML (PF) SYRINGE
PREFILLED_SYRINGE | INTRAVENOUS | Status: AC
  Filled 2023-04-19: qty 10

## 2023-04-19 MED ORDER — AMISULPRIDE (ANTIEMETIC) 5 MG/2ML IV SOLN: 10.0000 mg | Freq: Once | INTRAVENOUS | Status: DC | PRN

## 2023-04-19 MED ORDER — ACETAMINOPHEN 160 MG/5ML PO SOLN: 325.0000 mg | Freq: Once | ORAL | Status: DC | PRN

## 2023-04-19 MED ORDER — PHENYLEPHRINE 80 MCG/ML (10ML) SYRINGE FOR IV PUSH (FOR BLOOD PRESSURE SUPPORT)
PREFILLED_SYRINGE | INTRAVENOUS | Status: AC
  Filled 2023-04-19: qty 10

## 2023-04-19 MED ORDER — ONDANSETRON HCL 4 MG/2ML IJ SOLN
INTRAMUSCULAR | Status: AC
  Filled 2023-04-19: qty 2

## 2023-04-19 MED ORDER — DEXAMETHASONE SODIUM PHOSPHATE 10 MG/ML IJ SOLN
INTRAMUSCULAR | Status: AC
  Filled 2023-04-19: qty 1

## 2023-04-19 MED ORDER — LIDOCAINE 2% (20 MG/ML) 5 ML SYRINGE
INTRAMUSCULAR | Status: DC | PRN
  Administered 2023-04-19: 40 mg via INTRAVENOUS

## 2023-04-19 MED ORDER — OXYCODONE HCL 5 MG PO TABS: 5.0000 mg | ORAL_TABLET | ORAL | Status: DC | PRN

## 2023-04-19 MED ORDER — CYCLOBENZAPRINE HCL 10 MG PO TABS
10.0000 mg | ORAL_TABLET | Freq: Three times a day (TID) | ORAL | Status: DC | PRN
  Administered 2023-04-19: 10 mg via ORAL
  Filled 2023-04-19: qty 1

## 2023-04-19 MED ORDER — HYDROMORPHONE HCL 1 MG/ML IJ SOLN
INTRAMUSCULAR | Status: AC
  Filled 2023-04-19: qty 1

## 2023-04-19 MED ORDER — HYDROMORPHONE HCL 1 MG/ML IJ SOLN: 1.0000 mg | INTRAMUSCULAR | Status: DC | PRN

## 2023-04-19 MED ORDER — SODIUM CHLORIDE 0.9 % IV SOLN
250.0000 mL | INTRAVENOUS | Status: DC
  Administered 2023-04-19: 250 mL via INTRAVENOUS

## 2023-04-19 MED ORDER — PANTOPRAZOLE SODIUM 40 MG PO TBEC
40.0000 mg | DELAYED_RELEASE_TABLET | Freq: Every day | ORAL | Status: DC
  Administered 2023-04-20: 40 mg via ORAL
  Filled 2023-04-19 (×2): qty 1

## 2023-04-19 MED ORDER — BUSPIRONE HCL 10 MG PO TABS
10.0000 mg | ORAL_TABLET | Freq: Two times a day (BID) | ORAL | Status: DC
  Administered 2023-04-19: 10 mg via ORAL
  Filled 2023-04-19 (×2): qty 1

## 2023-04-19 MED ORDER — MIDAZOLAM HCL 2 MG/2ML IJ SOLN
INTRAMUSCULAR | Status: DC | PRN
  Administered 2023-04-19: 2 mg via INTRAVENOUS

## 2023-04-19 MED ORDER — OXYCODONE HCL 5 MG PO TABS
10.0000 mg | ORAL_TABLET | ORAL | Status: DC | PRN
  Administered 2023-04-19 – 2023-04-20 (×6): 10 mg via ORAL
  Filled 2023-04-19 (×6): qty 2

## 2023-04-19 MED ORDER — PHENYLEPHRINE HCL-NACL 20-0.9 MG/250ML-% IV SOLN
INTRAVENOUS | Status: AC
  Filled 2023-04-19: qty 500

## 2023-04-19 MED ORDER — LIDOCAINE-EPINEPHRINE 1 %-1:100000 IJ SOLN
INTRAMUSCULAR | Status: DC | PRN
  Administered 2023-04-19: 10 mL

## 2023-04-19 MED ORDER — PROPOFOL 10 MG/ML IV BOLUS
INTRAVENOUS | Status: AC
  Filled 2023-04-19: qty 20

## 2023-04-19 MED ORDER — SUCRALFATE 1 GM/10ML PO SUSP: 1.0000 g | Freq: Three times a day (TID) | ORAL | Status: DC

## 2023-04-19 MED ORDER — FENTANYL CITRATE (PF) 250 MCG/5ML IJ SOLN
INTRAMUSCULAR | Status: AC
  Filled 2023-04-19: qty 5

## 2023-04-19 MED ORDER — PROPOFOL 1000 MG/100ML IV EMUL
INTRAVENOUS | Status: AC
  Filled 2023-04-19: qty 100

## 2023-04-19 MED ORDER — LACTATED RINGERS IV SOLN: INTRAVENOUS | Status: DC

## 2023-04-19 MED ORDER — ORAL CARE MOUTH RINSE: 15.0000 mL | Freq: Once | OROMUCOSAL | Status: AC

## 2023-04-19 MED ORDER — ALPRAZOLAM 0.5 MG PO TABS
1.0000 mg | ORAL_TABLET | Freq: Three times a day (TID) | ORAL | Status: DC | PRN
  Administered 2023-04-19 – 2023-04-20 (×2): 1 mg via ORAL
  Filled 2023-04-19 (×2): qty 2

## 2023-04-19 MED ORDER — ACETAMINOPHEN 650 MG RE SUPP: 650.0000 mg | RECTAL | Status: DC | PRN

## 2023-04-19 MED ORDER — CEFAZOLIN SODIUM-DEXTROSE 2-4 GM/100ML-% IV SOLN
INTRAVENOUS | Status: AC
  Filled 2023-04-19: qty 100

## 2023-04-19 MED ORDER — CEFAZOLIN SODIUM-DEXTROSE 2-4 GM/100ML-% IV SOLN
2.0000 g | Freq: Once | INTRAVENOUS | Status: AC
  Administered 2023-04-19: 2 g via INTRAVENOUS

## 2023-04-19 MED ORDER — ACETAMINOPHEN 325 MG PO TABS
650.0000 mg | ORAL_TABLET | ORAL | Status: DC | PRN
  Administered 2023-04-19 – 2023-04-20 (×3): 650 mg via ORAL
  Filled 2023-04-19 (×3): qty 2

## 2023-04-19 MED ORDER — MENTHOL 3 MG MT LOZG: 1.0000 | LOZENGE | OROMUCOSAL | Status: DC | PRN

## 2023-04-19 MED ORDER — LIDOCAINE 2% (20 MG/ML) 5 ML SYRINGE
INTRAMUSCULAR | Status: AC
  Filled 2023-04-19: qty 5

## 2023-04-19 MED ORDER — 0.9 % SODIUM CHLORIDE (POUR BTL) OPTIME
TOPICAL | Status: DC | PRN
  Administered 2023-04-19: 1000 mL

## 2023-04-19 MED ORDER — PROPOFOL 10 MG/ML IV BOLUS
INTRAVENOUS | Status: DC | PRN
Start: 2023-04-19 — End: 2023-04-19
  Administered 2023-04-19: 200 mg via INTRAVENOUS

## 2023-04-19 MED ORDER — ONDANSETRON HCL 4 MG PO TABS
4.0000 mg | ORAL_TABLET | Freq: Four times a day (QID) | ORAL | Status: DC | PRN
  Administered 2023-04-20 (×2): 4 mg via ORAL
  Filled 2023-04-19 (×2): qty 1

## 2023-04-19 MED ORDER — POLYETHYLENE GLYCOL 3350 17 G PO PACK: 17.0000 g | PACK | Freq: Every day | ORAL | Status: DC | PRN

## 2023-04-19 MED ORDER — POLYVINYL ALCOHOL 1.4 % OP SOLN: 1.0000 [drp] | OPHTHALMIC | Status: DC | PRN

## 2023-04-19 MED ORDER — THROMBIN 5000 UNITS EX SOLR: OROMUCOSAL | Status: DC | PRN

## 2023-04-19 MED ORDER — LIDOCAINE-EPINEPHRINE 1 %-1:100000 IJ SOLN
INTRAMUSCULAR | Status: AC
  Filled 2023-04-19: qty 1

## 2023-04-19 MED ORDER — CEFAZOLIN SODIUM-DEXTROSE 2-4 GM/100ML-% IV SOLN
2.0000 g | Freq: Three times a day (TID) | INTRAVENOUS | Status: AC
  Administered 2023-04-19 (×2): 2 g via INTRAVENOUS
  Filled 2023-04-19 (×2): qty 100

## 2023-04-19 MED ORDER — SODIUM CHLORIDE 0.9% FLUSH
3.0000 mL | Freq: Two times a day (BID) | INTRAVENOUS | Status: DC
  Administered 2023-04-19 – 2023-04-20 (×3): 3 mL via INTRAVENOUS

## 2023-04-19 MED ORDER — MEPERIDINE HCL 25 MG/ML IJ SOLN: 6.2500 mg | INTRAMUSCULAR | Status: DC | PRN

## 2023-04-19 MED ORDER — FAMOTIDINE 20 MG PO TABS: 20.0000 mg | ORAL_TABLET | Freq: Every evening | ORAL | Status: DC | PRN

## 2023-04-19 MED ORDER — ACETAMINOPHEN 10 MG/ML IV SOLN: 1000.0000 mg | Freq: Once | INTRAVENOUS | Status: DC | PRN

## 2023-04-19 MED ORDER — THROMBIN 5000 UNITS EX SOLR
CUTANEOUS | Status: AC
  Filled 2023-04-19: qty 5000

## 2023-04-19 MED ORDER — PHENOL 1.4 % MT LIQD: 1.0000 | OROMUCOSAL | Status: DC | PRN

## 2023-04-19 MED ORDER — ACETAMINOPHEN 500 MG PO TABS
1000.0000 mg | ORAL_TABLET | Freq: Once | ORAL | Status: AC
  Administered 2023-04-19: 1000 mg via ORAL
  Filled 2023-04-19: qty 2

## 2023-04-19 MED ORDER — MIDAZOLAM HCL 2 MG/2ML IJ SOLN
INTRAMUSCULAR | Status: AC
  Filled 2023-04-19: qty 2

## 2023-04-19 MED ORDER — ONDANSETRON HCL 4 MG/2ML IJ SOLN: 4.0000 mg | Freq: Four times a day (QID) | INTRAMUSCULAR | Status: DC | PRN

## 2023-04-19 MED ORDER — FENTANYL CITRATE (PF) 250 MCG/5ML IJ SOLN
INTRAMUSCULAR | Status: DC | PRN
  Administered 2023-04-19: 75 ug via INTRAVENOUS
  Administered 2023-04-19: 25 ug via INTRAVENOUS
  Administered 2023-04-19: 50 ug via INTRAVENOUS

## 2023-04-19 MED ORDER — ONDANSETRON HCL 4 MG/2ML IJ SOLN
INTRAMUSCULAR | Status: DC | PRN
  Administered 2023-04-19: 4 mg via INTRAVENOUS

## 2023-04-19 MED ORDER — SCOPOLAMINE 1 MG/3DAYS TD PT72
1.0000 | MEDICATED_PATCH | TRANSDERMAL | Status: DC
  Administered 2023-04-19: 1.5 mg via TRANSDERMAL
  Filled 2023-04-19: qty 1

## 2023-04-19 MED ORDER — SUGAMMADEX SODIUM 200 MG/2ML IV SOLN
INTRAVENOUS | Status: DC | PRN
  Administered 2023-04-19: 450 mg via INTRAVENOUS

## 2023-04-19 MED ORDER — PROMETHAZINE HCL 25 MG/ML IJ SOLN: 6.2500 mg | INTRAMUSCULAR | Status: DC | PRN

## 2023-04-19 MED ORDER — ROCURONIUM BROMIDE 10 MG/ML (PF) SYRINGE
PREFILLED_SYRINGE | INTRAVENOUS | Status: DC | PRN
  Administered 2023-04-19: 20 mg via INTRAVENOUS
  Administered 2023-04-19: 60 mg via INTRAVENOUS
  Administered 2023-04-19: 20 mg via INTRAVENOUS

## 2023-04-19 MED ORDER — ACETAMINOPHEN 325 MG PO TABS: 325.0000 mg | ORAL_TABLET | Freq: Once | ORAL | Status: DC | PRN

## 2023-04-19 MED ORDER — PHENYLEPHRINE HCL-NACL 20-0.9 MG/250ML-% IV SOLN
INTRAVENOUS | Status: DC | PRN
  Administered 2023-04-19: 20 ug/min via INTRAVENOUS

## 2023-04-19 MED ORDER — HYDROMORPHONE HCL 1 MG/ML IJ SOLN
0.2500 mg | INTRAMUSCULAR | Status: DC | PRN
  Administered 2023-04-19 (×4): 0.5 mg via INTRAVENOUS

## 2023-04-19 MED ORDER — SODIUM CHLORIDE 0.9% FLUSH: 3.0000 mL | INTRAVENOUS | Status: DC | PRN

## 2023-04-19 MED ORDER — CHLORHEXIDINE GLUCONATE 0.12 % MT SOLN
15.0000 mL | Freq: Once | OROMUCOSAL | Status: AC
  Administered 2023-04-19: 15 mL via OROMUCOSAL
  Filled 2023-04-19: qty 15

## 2023-04-19 MED ORDER — DEXAMETHASONE SODIUM PHOSPHATE 10 MG/ML IJ SOLN
INTRAMUSCULAR | Status: DC | PRN
  Administered 2023-04-19: 10 mg via INTRAVENOUS

## 2023-04-19 MED ORDER — DOCUSATE SODIUM 100 MG PO CAPS
100.0000 mg | ORAL_CAPSULE | Freq: Two times a day (BID) | ORAL | Status: DC
  Administered 2023-04-19 – 2023-04-20 (×3): 100 mg via ORAL
  Filled 2023-04-19 (×3): qty 1

## 2023-04-19 SURGICAL SUPPLY — 52 items
ADH SKN CLS APL DERMABOND .7 (GAUZE/BANDAGES/DRESSINGS) ×1
BAG COUNTER SPONGE SURGICOUNT (BAG) ×2 IMPLANT
BAG SPNG CNTER NS LX DISP (BAG) ×1
BLADE CLIPPER SURG (BLADE) IMPLANT
BLADE SURG 11 STRL SS (BLADE) ×2 IMPLANT
BUR SURG IBUR 4X12.5 (BURR) IMPLANT
BUR SURGICAL HEAD PROX 3X12.5 (BURR) ×2 IMPLANT
BURR SURG IBUR 4X12.5 (BURR)
BURR SURGICAL HEAD PROX 3X12.5 (BURR) ×1
CANISTER SUCT 3000ML PPV (MISCELLANEOUS) ×2 IMPLANT
DERMABOND ADVANCED .7 DNX12 (GAUZE/BANDAGES/DRESSINGS) ×2 IMPLANT
DRAPE C-ARM 42X72 X-RAY (DRAPES) ×4 IMPLANT
DRAPE LAPAROTOMY 100X72X124 (DRAPES) ×2 IMPLANT
DRAPE MICROSCOPE SLANT 54X150 (MISCELLANEOUS) ×2 IMPLANT
DRAPE SURG 17X23 STRL (DRAPES) ×2 IMPLANT
DURAPREP 26ML APPLICATOR (WOUND CARE) ×2 IMPLANT
ELECT BLADE 6.5 EXT (BLADE) ×2 IMPLANT
ELECT REM PT RETURN 9FT ADLT (ELECTROSURGICAL) ×1
ELECTRODE REM PT RTRN 9FT ADLT (ELECTROSURGICAL) ×2 IMPLANT
GAUZE 4X4 16PLY ~~LOC~~+RFID DBL (SPONGE) IMPLANT
GAUZE SPONGE 4X4 12PLY STRL (GAUZE/BANDAGES/DRESSINGS) IMPLANT
GLOVE BIO SURGEON STRL SZ7.5 (GLOVE) ×2 IMPLANT
GLOVE BIOGEL PI IND STRL 7.0 (GLOVE) ×2 IMPLANT
GLOVE BIOGEL PI IND STRL 7.5 (GLOVE) ×2 IMPLANT
GLOVE ECLIPSE 7.5 STRL STRAW (GLOVE) ×2 IMPLANT
GLOVE EXAM NITRILE LRG STRL (GLOVE) IMPLANT
GLOVE EXAM NITRILE XL STR (GLOVE) IMPLANT
GLOVE EXAM NITRILE XS STR PU (GLOVE) IMPLANT
GOWN STRL REUS W/ TWL LRG LVL3 (GOWN DISPOSABLE) ×4 IMPLANT
GOWN STRL REUS W/ TWL XL LVL3 (GOWN DISPOSABLE) IMPLANT
GOWN STRL REUS W/TWL 2XL LVL3 (GOWN DISPOSABLE) IMPLANT
GOWN STRL REUS W/TWL LRG LVL3 (GOWN DISPOSABLE) ×2
GOWN STRL REUS W/TWL XL LVL3 (GOWN DISPOSABLE)
HEMOSTAT POWDER KIT SURGIFOAM (HEMOSTASIS) ×2 IMPLANT
KIT BASIN OR (CUSTOM PROCEDURE TRAY) ×2 IMPLANT
KIT TURNOVER KIT B (KITS) ×2 IMPLANT
NDL HYPO 22X1.5 SAFETY MO (MISCELLANEOUS) ×2 IMPLANT
NDL SPNL 18GX3.5 QUINCKE PK (NEEDLE) ×2 IMPLANT
NEEDLE HYPO 22X1.5 SAFETY MO (MISCELLANEOUS) ×1
NEEDLE SPNL 18GX3.5 QUINCKE PK (NEEDLE) ×1
NS IRRIG 1000ML POUR BTL (IV SOLUTION) ×2 IMPLANT
PACK LAMINECTOMY NEURO (CUSTOM PROCEDURE TRAY) ×2 IMPLANT
PAD ARMBOARD 7.5X6 YLW CONV (MISCELLANEOUS) ×6 IMPLANT
SPIKE FLUID TRANSFER (MISCELLANEOUS) ×2 IMPLANT
SPONGE T-LAP 4X18 ~~LOC~~+RFID (SPONGE) IMPLANT
SUT MNCRL AB 3-0 PS2 18 (SUTURE) IMPLANT
SUT VIC AB 0 CT1 18XCR BRD8 (SUTURE) IMPLANT
SUT VIC AB 0 CT1 8-18 (SUTURE)
SUT VIC AB 2-0 CP2 18 (SUTURE) ×2 IMPLANT
TOWEL GREEN STERILE (TOWEL DISPOSABLE) ×2 IMPLANT
TOWEL GREEN STERILE FF (TOWEL DISPOSABLE) ×2 IMPLANT
WATER STERILE IRR 1000ML POUR (IV SOLUTION) ×2 IMPLANT

## 2023-04-19 NOTE — H&P (Signed)
Surgical H&P Update  HPI: 57 y.o. with a history of prior MIS discectomy with recurrent symptoms. Interestingly, more left sided symptoms than last time. Workup showed large recurrent disc herniation at L4-5. No changes in health since they were last seen. Still having the above and wishes to proceed with surgery.  PMHx:  Past Medical History:  Diagnosis Date   Abdominal pain    Allergies    Anemia    Anxiety    Arthritis    Cancer (HCC)    skin   Complication of anesthesia 1991   dizziness; almost passed out, post op   Depression    Dyspnea    with exertion   Family history of breast cancer    Cousins on both sides of family   Fatty liver    GERD (gastroesophageal reflux disease) 03/01/2017   History of hiatal hernia    History of melanoma    History of recurrent UTIs    Migraine    last botox injection 04-13-23   Multiple nevi    Obstructive sleep apnea    Panic disorder 1995   Polycystic ovarian disease    FamHx:  Family History  Problem Relation Age of Onset   Cervical cancer Mother    Stroke Mother    Aneurysm Mother    Cervical cancer Sister    Other Paternal Grandmother        had part of colon removed   Breast cancer Cousin        Both sides of family   Cancer - Colon Paternal Uncle    SocHx:  reports that she has never smoked. She has never used smokeless tobacco. She reports that she does not currently use alcohol. She reports that she does not use drugs.  Physical Exam: Strength 5/5 x4 and SILTx4 except L>R foot numbness  Assesment/Plan: 57 y.o. woman with recurrent L4-5 HNP and BLE radicular symptoms, here for repeat L4-5 MIS discectomy. Risks, benefits, and alternatives discussed and the patient would like to continue with surgery.  -OR today -PACU vs 3C post-op  Jadene Pierini, MD 04/19/23 7:34 AM

## 2023-04-19 NOTE — Anesthesia Preprocedure Evaluation (Addendum)
Anesthesia Evaluation  Patient identified by MRN, date of birth, ID band Patient awake    Reviewed: Allergy & Precautions, NPO status , Patient's Chart, lab work & pertinent test results  Airway Mallampati: IV  TM Distance: <3 FB Neck ROM: Full    Dental  (+) Teeth Intact, Dental Advisory Given   Pulmonary sleep apnea    breath sounds clear to auscultation       Cardiovascular negative cardio ROS  Rhythm:Regular Rate:Normal     Neuro/Psych  Headaches PSYCHIATRIC DISORDERS Anxiety Depression     Neuromuscular disease    GI/Hepatic Neg liver ROS, hiatal hernia,GERD  Medicated,,  Endo/Other  diabetes, Type 2, Oral Hypoglycemic Agents    Renal/GU negative Renal ROS     Musculoskeletal  (+) Arthritis ,    Abdominal   Peds  Hematology negative hematology ROS (+)   Anesthesia Other Findings   Reproductive/Obstetrics                             Anesthesia Physical Anesthesia Plan  ASA: 3  Anesthesia Plan: General   Post-op Pain Management: Tylenol PO (pre-op)*   Induction: Intravenous  PONV Risk Score and Plan: 4 or greater and Ondansetron, Dexamethasone, Midazolam, Scopolamine patch - Pre-op and Propofol infusion  Airway Management Planned: Oral ETT and Video Laryngoscope Planned  Additional Equipment: None  Intra-op Plan:   Post-operative Plan: Extubation in OR  Informed Consent: I have reviewed the patients History and Physical, chart, labs and discussed the procedure including the risks, benefits and alternatives for the proposed anesthesia with the patient or authorized representative who has indicated his/her understanding and acceptance.     Dental advisory given  Plan Discussed with: CRNA  Anesthesia Plan Comments:        Anesthesia Quick Evaluation

## 2023-04-19 NOTE — Progress Notes (Signed)
Pt refused UPT, pre-operatively.   Viviano Simas, RN

## 2023-04-19 NOTE — Transfer of Care (Signed)
Immediate Anesthesia Transfer of Care Note  Patient: Kimberly Ortega  Procedure(s) Performed: REVISION LUMBAR FOUR-LUMBAR FIVE MICRODISCECTOMY (Spine Lumbar)  Patient Location: PACU  Anesthesia Type:General  Level of Consciousness: awake, drowsy, and patient cooperative  Airway & Oxygen Therapy: Patient Spontanous Breathing and Patient connected to face mask oxygen  Post-op Assessment: Report given to RN and Post -op Vital signs reviewed and stable  Post vital signs: Reviewed and stable  Last Vitals:  Vitals Value Taken Time  BP 130/66 04/19/23 0937  Temp    Pulse 95 04/19/23 0941  Resp 16 04/19/23 0941  SpO2 96 % 04/19/23 0941  Vitals shown include unfiled device data.  Last Pain:  Vitals:   04/19/23 0614  TempSrc: Oral  PainSc: 8       Patients Stated Pain Goal: 2 (04/19/23 7829)  Complications: There were no known notable events for this encounter.

## 2023-04-19 NOTE — Anesthesia Procedure Notes (Signed)
Procedure Name: Intubation Date/Time: 04/19/2023 7:53 AM  Performed by: Owens Loffler, RNPre-anesthesia Checklist: Patient identified, Emergency Drugs available, Suction available, Patient being monitored and Timeout performed Patient Re-evaluated:Patient Re-evaluated prior to induction Oxygen Delivery Method: Circle system utilized Preoxygenation: Pre-oxygenation with 100% oxygen Induction Type: IV induction Ventilation: Mask ventilation without difficulty Laryngoscope Size: Glidescope and 3 Grade View: Grade I Tube type: Oral Tube size: 7.0 mm Number of attempts: 1 Airway Equipment and Method: Video-laryngoscopy Placement Confirmation: ETT inserted through vocal cords under direct vision, positive ETCO2, CO2 detector and breath sounds checked- equal and bilateral Secured at: 21 cm Tube secured with: Tape Dental Injury: Teeth and Oropharynx as per pre-operative assessment

## 2023-04-19 NOTE — Op Note (Signed)
PATIENT: Kimberly Ortega  DAY OF SURGERY: 04/19/23   PRE-OPERATIVE DIAGNOSIS:  Herniated nucleus pulposus with lumbar radiculopathy   POST-OPERATIVE DIAGNOSIS:  Herniated nucleus pulposus with lumbar radiculopathy   PROCEDURE:  Repeat right minimally invasive L4-5 discectomy   SURGEON:  Surgeon(s) and Role:    Jadene Pierini, MD    Emilee Hero PA   ANESTHESIA: ETGA   BRIEF HISTORY: This is a 57 year old woman in whom I recently performed a right 4-5 MIS discectomy that presented with recurrent symptoms as well as new left sided leg symptoms, MRI showed a recurrent disc herniation at L4-5 without any other radiographic cause of her symptoms. I therefore recommended a minimally invasive L4-5 discectomy. This was discussed with the patient as well as risks, benefits, and alternatives and the patient wished to proceed with surgical treatment.    OPERATIVE DETAIL: The patient was taken to the operating room and placed on the OR table in the prone position. A formal time out was performed with two patient identifiers and confirmed the operative site. Anesthesia was induced by the anesthesia team. The operative site was marked, hair was clipped with surgical clippers, the area was then prepped and draped in a sterile fashion. Fluoroscopy was used to identify the surgical level prior to incision.   The patient's prior incision was used. It was anesthetized with local anesthetic and incised. The fascia was incised sharply and serial dilators were docked to the lamino-facet junction. After a final dilator was placed, a tubular retractor was placed over this and secured to the table with obvious careful attention to the known existing laminar defect from the prior surgery. The operating microscope was draped and brought into the field. Anatomy was palpated and confirmed, monopolar cautery was used to expose the facet, lamina, and a portion of the spinous process to confirm orientation. The prior  laminotomy was expanded, scar tissue was significant and dissected free without issue. A disc herniation was clearly present. An annulotomy was created sharply and large, free disc fragments that were removed then dissection was expanded laterally and medially until no significant fragments were left, mainly by reducing fragments into the disc space with an Epstein curette. The annulotomy was explored and additional free fragments were removed. The traversing nerve root was palpated throughout its visible course to confirm good decompression. The medial aspect of the neuro-foramen was also probed with a right angle ball-tip probe to confirm decompression of the exiting nerve root.   Kimberly Consentino PA was scrubbed and assisted with the entire procedure which included exposure, discectomy, and closure.  Hemostasis was obtained, the wound was copiously irrigated, and the tube was removed while using the microscope to confirm hemostasis of the muscle edges. All instrument and sponge counts were correct and the incision was then closed in layers. [name]. No apparent complications at the completion of the procedure.    EBL:  25mL   DRAINS: none   SPECIMENS: none   Jadene Pierini, MD

## 2023-04-19 NOTE — Progress Notes (Signed)
Placed patient on CPAP for the night via auto-mode.  

## 2023-04-19 NOTE — Anesthesia Postprocedure Evaluation (Signed)
Anesthesia Post Note  Patient: [name]  Procedure(s) Performed: REVISION LUMBAR FOUR-LUMBAR FIVE MICRODISCECTOMY (Spine Lumbar)     Patient location during evaluation: PACU Anesthesia Type: General Level of consciousness: awake and alert Pain management: pain level controlled Vital Signs Assessment: post-procedure vital signs reviewed and stable Respiratory status: spontaneous breathing, nonlabored ventilation, respiratory function stable and patient connected to nasal cannula oxygen Cardiovascular status: blood pressure returned to baseline and stable Postop Assessment: no apparent nausea or vomiting Anesthetic complications: no  There were no known notable events for this encounter.  Last Vitals:  Vitals:   04/19/23 1047 04/19/23 1548  BP: 134/82 115/70  Pulse: 66 67  Resp: 20 20  Temp: 36.6 C 36.7 C  SpO2: 96% 95%    Last Pain:  Vitals:   04/19/23 1548  TempSrc: Oral  PainSc:                  Shelton Silvas

## 2023-04-20 ENCOUNTER — Other Ambulatory Visit (HOSPITAL_COMMUNITY): Payer: Self-pay

## 2023-04-20 ENCOUNTER — Telehealth: Payer: Self-pay

## 2023-04-20 ENCOUNTER — Encounter (HOSPITAL_COMMUNITY): Payer: Self-pay | Admitting: Neurological Surgery

## 2023-04-20 ENCOUNTER — Encounter: Payer: Self-pay | Admitting: *Deleted

## 2023-04-20 DIAGNOSIS — M5116 Intervertebral disc disorders with radiculopathy, lumbar region: Secondary | ICD-10-CM | POA: Diagnosis not present

## 2023-04-20 MED ORDER — ONDANSETRON HCL 4 MG PO TABS: 4.0000 mg | ORAL_TABLET | Freq: Four times a day (QID) | ORAL | 0 refills | Status: DC | PRN

## 2023-04-20 MED ORDER — CYCLOBENZAPRINE HCL 10 MG PO TABS: 10.0000 mg | ORAL_TABLET | Freq: Three times a day (TID) | ORAL | 0 refills | Status: DC | PRN

## 2023-04-20 MED ORDER — OXYCODONE HCL 10 MG PO TABS: 10.0000 mg | ORAL_TABLET | Freq: Three times a day (TID) | ORAL | 0 refills | Status: DC | PRN

## 2023-04-20 MED ORDER — OXYCODONE HCL 10 MG PO TABS: 10.0000 mg | ORAL_TABLET | ORAL | 0 refills | Status: DC | PRN

## 2023-04-20 NOTE — Evaluation (Signed)
Occupational Therapy Evaluation Patient Details Name: Kimberly Ortega MRN: [id number] DOB: [birth year] Today's Date: 04/20/2023   History of Present Illness Pt is a 58 y/o female s/p L4-5 discectomy.   Clinical Impression   Patient evaluated by Occupational Therapy with no further acute OT needs identified. All education has been completed and the patient has no further questions. Pt lives alone at home and is independent with ADL tasks and functional mobility at baseline. Patient is confined to a single room and not able to walk the distance required to go the bathroom, or he/she is unable to safely negotiate stairs required to access the bathroom.  A 3in1 BSC will alleviate this problem.  See below for any follow-up Occupational Therapy or equipment needs. OT is signing off. Thank you for this referral.       Recommendations for follow up therapy are one component of a multi-disciplinary discharge planning process, led by the attending physician.  Recommendations may be updated based on patient status, additional functional criteria and insurance authorization.   Assistance Recommended at Discharge Set up Supervision/Assistance  Patient can return home with the following A little help with bathing/dressing/bathroom;Help with stairs or ramp for entrance;Assistance with cooking/housework;Assist for transportation    Functional Status Assessment  Patient has had a recent decline in their functional status and demonstrates the ability to make significant improvements in function in a reasonable and predictable amount of time.  Equipment Recommendations  BSC/3in1;Other (comment) (RW)       Precautions / Restrictions Precautions Precautions: Back Precaution Booklet Issued: Yes (comment) Precaution Comments: Provided handout for reference. Verbal education provided. No back brace needed per MD. Restrictions Weight Bearing Restrictions: No      Mobility Bed Mobility Overal bed mobility: Needs  Assistance Bed Mobility: Sidelying to Sit   Sidelying to sit: Supervision       General bed mobility comments: VC provided on back precautions when completing bed mobility.    Transfers Overall transfer level: Needs assistance Equipment used: Rolling walker (2 wheels) Transfers: Sit to/from Stand, Bed to chair/wheelchair/BSC Sit to Stand: Supervision, From elevated surface     Step pivot transfers: Supervision     General transfer comment: VC for hand placement during RW management. Education provided on safe and effective way to approach the toilet and sink with RW. Recommended either walking towards toilet and then turning with RW or side stepping laterally.      Balance Overall balance assessment: Needs assistance Sitting-balance support: Bilateral upper extremity supported, Feet supported Sitting balance-Leahy Scale: Fair Sitting balance - Comments: sitting EOB. Limited by back precautions and surgical pain.   Standing balance support: Bilateral upper extremity supported, During functional activity, Reliant on assistive device for balance Standing balance-Leahy Scale: Poor Standing balance comment: Pt initially demonstrates a posterior lean with sit to stand transition. Able to self correct independently.                    ADL either performed or assessed with clinical judgement   ADL         General ADL Comments: Due to recent back surgery, pt may require Min A for LB dressing. Education provided on compensatory techniques to maintain back precautions when performing LB dressing, toileting, grooming, and bathing. Pt will have sister present when taking a shower for safety. Pt was able to verbalize and/or demonstrate understanding.     Vision Baseline Vision/History: 1 Wears glasses Ability to See in Adequate Light: 0 Adequate Patient Visual Report: No  change from baseline Vision Assessment?: No apparent visual deficits            Pertinent Vitals/Pain  Pain Assessment Pain Assessment: 0-10 Pain Score: 7  Pain Location: back Pain Descriptors / Indicators: Sore, Discomfort Pain Intervention(s): Limited activity within patient's tolerance, Monitored during session, Repositioned     Hand Dominance Right   Extremity/Trunk Assessment Upper Extremity Assessment Upper Extremity Assessment: Overall WFL for tasks assessed   Lower Extremity Assessment Lower Extremity Assessment: Defer to PT evaluation   Cervical / Trunk Assessment Cervical / Trunk Assessment: Back Surgery   Communication Communication Communication: No difficulties   Cognition Arousal/Alertness: Awake/alert Behavior During Therapy: WFL for tasks assessed/performed Overall Cognitive Status: Within Functional Limits for tasks assessed           General Comments  Surgical dressing intact on low back            Home Living Family/patient expects to be discharged to:: Private residence Living Arrangements: Alone Available Help at Discharge: Family;Available PRN/intermittently (Sister works but can help a little when off. She has her own health issues and isn't able to assist a lot physically.) Type of Home: House Home Access: Stairs to enter [employer] of Steps: 3-4 (front entrance) Entrance Stairs-Rails: None Home Layout: Two level;Able to live on main level with bedroom/bathroom     Bathroom Shower/Tub: Producer, television/film/video:  (Pt reports toilets are low level) Bathroom Accessibility: Yes How Accessible: Accessible via walker Home Equipment: None          Prior Functioning/Environment Prior Level of Function : Independent/Modified Independent    Mobility Comments: independent ADLs Comments: independent        OT Problem List: Impaired balance (sitting and/or standing);Decreased knowledge of use of DME or AE      OT Treatment/Interventions:   Self care/home management   OT Goals(Current goals can be found in the care  plan section) Acute Rehab OT Goals Patient Stated Goal: to stay one more night before returning home  OT Frequency:  1X visit       AM-PAC OT "6 Clicks" Daily Activity     Outcome Measure Help from another person eating meals?: None Help from another person taking care of personal grooming?: None Help from another person toileting, which includes using toliet, bedpan, or urinal?: None Help from another person bathing (including washing, rinsing, drying)?: A Little Help from another person to put on and taking off regular upper body clothing?: None Help from another person to put on and taking off regular lower body clothing?: A Little 6 Click Score: 22   End of Session Equipment Utilized During Treatment: Rolling walker (2 wheels) Nurse Communication: Other (comment) (need for DME)  Activity Tolerance: Patient tolerated treatment well Patient left: in bed;with call bell/phone within reach  OT Visit Diagnosis: Unsteadiness on feet (R26.81);Muscle weakness (generalized) (M62.81)                Time: 1610-9604 OT Time Calculation (min): 39 min Charges:  OT General Charges $OT Visit: 1 Visit OT Evaluation $OT Eval Moderate Complexity: 1 Mod OT Treatments $Self Care/Home Management : 8-22 mins  Limmie Patricia, OTR/L,CBIS  Supplemental OT - [facility] and [facility] Secure Chat Preferred    Ebenezer Mccaskey, Charisse March 04/20/2023, 9:39 AM

## 2023-04-20 NOTE — Progress Notes (Signed)
Neurosurgery Service Progress Note  Subjective: No acute events overnight. Notices some improvement in LE radiculopathy. Back pain as expected for procedure.    Objective: Vitals:   04/19/23 2136 04/19/23 2231 04/20/23 0542 04/20/23 0740  BP:  109/70 103/78 113/79  Pulse: 75 65 86 77  Resp: 18 20 20 16   Temp:  98.3 F (36.8 C) 98.5 F (36.9 C) 97.6 F (36.4 C)  TempSrc:  Oral Oral Oral  SpO2: 96% 95% 96% 98%  Weight:      Height:        Physical Exam: Strength 5/5 x4 and SILTx4, incision c/d/i  Assessment & Plan: 57 y.o. female s/p revision right MIS L4-5 discectomy, recovering well.  -PT/OT  -discharge home today   Emilee Hero, PA-C 04/20/23 11:35 AM

## 2023-04-20 NOTE — Evaluation (Signed)
Physical Therapy Evaluation Patient Details Name: Kimberly Ortega MRN: [id number] DOB: [birth year] Today's Date: 04/20/2023  History of Present Illness  Pt is a 57 y/o female s/p L4-5 discectomy.  Clinical Impression  Pt admitted with above. Pt with high anxiety and reports having "panic disorder". Pt very anxious returning home alone as her sister is her only support system and is available PRN as she works. Educated on safe transfer in/out of bed vis log roll, stair negotiation with assist of sister providing HHA as pt with no handrails at home. Recommend HHPT to address below deficits and progress back to independence as pt lives alone. Acute PT to cont to follow.        If plan is discharge home, recommend the following: Assist for transportation;Help with stairs or ramp for entrance;A little help with bathing/dressing/bathroom   Can travel by private vehicle        Equipment Recommendations Rolling walker (2 wheels);BSC/3in1  Recommendations for Other Services       Functional Status Assessment Patient has had a recent decline in their functional status and demonstrates the ability to make significant improvements in function in a reasonable and predictable amount of time.     Precautions / Restrictions Precautions Precautions: Back Precaution Booklet Issued: Yes (comment) Precaution Comments: Provided handout for reference. Verbal education provided. No back brace needed per MD. Restrictions Weight Bearing Restrictions: No      Mobility  Bed Mobility Overal bed mobility: Needs Assistance Bed Mobility: Sidelying to Sit, Sit to Sidelying, Rolling Rolling: Supervision       Sit to sidelying: Min assist General bed mobility comments: increased time, minA for LE management    Transfers Overall transfer level: Needs assistance Equipment used: Rolling walker (2 wheels) Transfers: Sit to/from Stand, Bed to chair/wheelchair/BSC Sit to Stand: Supervision, From elevated  surface           General transfer comment: VC for hand placement during RW management.    Ambulation/Gait Ambulation/Gait assistance: Min guard Gait Distance (Feet): 100 Feet Assistive device: Rolling walker (2 wheels) Gait Pattern/deviations: Step-through pattern, Decreased stride length, Trunk flexed Gait velocity: dec Gait velocity interpretation: <1.31 ft/sec, indicative of household ambulator   General Gait Details: pt with increased dependence on bilat UEs despite education to relax shoulders  Stairs Stairs: Yes Stairs assistance: Mod assist Stair Management: No rails, Step to pattern (L HHA) Number of Stairs: 2 General stair comments: pt dependent on PT with increased anxiety. pt with no handrail at home.  Wheelchair Mobility     Tilt Bed    Modified Rankin (Stroke Patients Only)       Balance Overall balance assessment: Needs assistance Sitting-balance support: Bilateral upper extremity supported, Feet supported Sitting balance-Leahy Scale: Fair Sitting balance - Comments: sitting EOB. Limited by back precautions and surgical pain.   Standing balance support: Bilateral upper extremity supported, During functional activity, Reliant on assistive device for balance Standing balance-Leahy Scale: Poor Standing balance comment: Pt initially demonstrates a posterior lean with sit to stand transition. Able to self correct independently.                             Pertinent Vitals/Pain Pain Assessment Pain Assessment: 0-10 Pain Score: 9  Pain Location: back Pain Descriptors / Indicators: Sore, Discomfort Pain Intervention(s): Monitored during session    Home Living Family/patient expects to be discharged to:: Private residence Living Arrangements: Alone Available Help at Discharge: Family;Available PRN/intermittently (Sister  works but can help a little when off. She has her own health issues and isn't able to assist a lot physically.) Type of  Home: House Home Access: Stairs to enter Entrance Stairs-Rails: None Entrance Stairs-Number of Steps: 3-4 (front entrance)   Home Layout: Two level;Able to live on main level with bedroom/bathroom Home Equipment: None      Prior Function Prior Level of Function : Independent/Modified Independent             Mobility Comments: independent ADLs Comments: independent     Hand Dominance   Dominant Hand: Right    Extremity/Trunk Assessment   Upper Extremity Assessment Upper Extremity Assessment: Overall WFL for tasks assessed    Lower Extremity Assessment Lower Extremity Assessment: Generalized weakness    Cervical / Trunk Assessment Cervical / Trunk Assessment: Back Surgery  Communication   Communication: No difficulties  Cognition Arousal/Alertness: Awake/alert Behavior During Therapy: WFL for tasks assessed/performed Overall Cognitive Status: Within Functional Limits for tasks assessed                                          General Comments General comments (skin integrity, edema, etc.): pt very anxious reporting she has panic disorder, surgical dressing intact    Exercises     Assessment/Plan    PT Assessment    PT Problem List         PT Treatment Interventions      PT Goals (Current goals can be found in the Care Plan section)  Acute Rehab PT Goals Patient Stated Goal: get better before going home PT Goal Formulation: With patient Time For Goal Achievement: 05/04/23 Potential to Achieve Goals: Good    Frequency       Co-evaluation               AM-PAC PT "6 Clicks" Mobility  Outcome Measure Help needed turning from your back to your side while in a flat bed without using bedrails?: A Little Help needed moving from lying on your back to sitting on the side of a flat bed without using bedrails?: A Little Help needed moving to and from a bed to a chair (including a wheelchair)?: A Little Help needed standing up from a  chair using your arms (e.g., wheelchair or bedside chair)?: A Little Help needed to walk in hospital room?: A Little Help needed climbing 3-5 steps with a railing? : A Lot 6 Click Score: 17    End of Session Equipment Utilized During Treatment: Gait belt Activity Tolerance: Patient limited by pain (self limiting due to anxiety) Patient left: in bed;with call bell/phone within reach Nurse Communication: Mobility status (anxiety re going home, and HOB malfunction) PT Visit Diagnosis: Unsteadiness on feet (R26.81);Muscle weakness (generalized) (M62.81);Difficulty in walking, not elsewhere classified (R26.2)    Time: 0935-1000 PT Time Calculation (min) (ACUTE ONLY): 25 min   Charges:   PT Evaluation $PT Eval Moderate Complexity: 1 Mod PT Treatments $Gait Training: 8-22 mins PT General Charges $$ ACUTE PT VISIT: 1 Visit         Lewis Shock, PT, DPT Acute Rehabilitation Services Secure chat preferred Office #: [phone]   Iona Hansen 04/20/2023, 11:40 AM

## 2023-04-20 NOTE — Discharge Instructions (Signed)
Wound Care REMOVE DRESSING IN 3 DAYS Leave incision open to air. You may shower. Do not scrub directly on incision.  Do not put any creams, lotions, or ointments on incision. Activity Walk each and every day, increasing distance each day. No lifting greater than 8 lbs.  Avoid bending, arching, and twisting. No driving for 2 weeks; may ride as a passenger locally.  Diet Resume your normal diet.  Return to Work Will be discussed at you follow up appointment. Call Your Doctor If Any of These Occur Redness, drainage, or swelling at the wound.  Temperature greater than 101 degrees. Severe pain not relieved by pain medication. Incision starts to come apart. Follow Up Appt Call  [phone])  for problems.

## 2023-04-20 NOTE — Telephone Encounter (Signed)
Pharmacy Patient Advocate Encounter   Received notification from CoverMyMeds that prior authorization for Ubrelvy 100MG  tablets is required/requested.   Insurance verification completed.   The patient is insured through [facility] .   Per test claim: PA required; PA submitted to [facility] via CoverMyMeds Key/confirmation #/EOC [id number]  Status is pending

## 2023-04-20 NOTE — Progress Notes (Signed)
Patient alert and oriented, mae's well, voiding adequate amount of urine, swallowing without difficulty, no c/o pain at time of discharge. Patient discharged home with family. Script and discharged instructions given to patient. Patient and family stated understanding of instructions given. Patient has an appointment with Dr. Ostergard in 2 weeks 

## 2023-04-20 NOTE — Discharge Summary (Signed)
Discharge Summary  Date of Admission: 04/19/2023  Date of Discharge: 04/20/23  Attending Physician: Autumn Patty, MD  Hospital Course: Patient was admitted following an uncomplicated revision right MIS L4-5 discectomy. They were recovered in PACU and transferred to [facility]. Their preop symptoms were improved, their hospital course was uncomplicated and the patient was discharged home today. They will follow up in clinic with me in clinic in 2 weeks.  Neurologic exam at discharge:  Strength 5/5 x4 and SILTx4, incision c/d/I   Discharge diagnosis: HNP with lumbar radiculopathy   Iran Sizer, PA-C 04/20/23 11:37 AM

## 2023-04-21 ENCOUNTER — Other Ambulatory Visit (HOSPITAL_COMMUNITY): Payer: Self-pay

## 2023-04-21 NOTE — Telephone Encounter (Signed)
Pharmacy Patient Advocate Encounter  Received notification from [facility] that Prior Authorization for Ubrelvy 100MG  tablets has been APPROVED from 04/20/2023 to 04/19/2024. Ran test claim, Copay is $Unable to obtain copay due to medication must be filled with [facility]  PA #/Case ID/Reference #: PA Case ID #: [id number]

## 2023-04-22 ENCOUNTER — Telehealth: Payer: Self-pay | Admitting: *Deleted

## 2023-04-22 ENCOUNTER — Ambulatory Visit (HOSPITAL_COMMUNITY): Payer: 59 | Admitting: Clinical

## 2023-04-22 NOTE — Telephone Encounter (Signed)
Received denial letter for Sucralfate. Informed pt. She states she has new insurance and will try with the new insurance and let me know. Sent letter to scan center.

## 2023-04-30 ENCOUNTER — Ambulatory Visit (HOSPITAL_COMMUNITY): Payer: Self-pay | Admitting: Clinical

## 2023-05-21 ENCOUNTER — Telehealth: Payer: Self-pay

## 2023-05-21 ENCOUNTER — Other Ambulatory Visit (HOSPITAL_COMMUNITY): Payer: Self-pay

## 2023-05-21 NOTE — Telephone Encounter (Signed)
   We ran into this issue the last time we tried to get Kimberly Ortega approved-Has PT been able to get [name] the information about her second Triptan? Please advise-

## 2023-05-25 ENCOUNTER — Other Ambulatory Visit (HOSPITAL_COMMUNITY): Payer: Self-pay

## 2023-05-25 NOTE — Telephone Encounter (Signed)
Pharmacy Patient Advocate Encounter   Received notification from Physician's Office that prior authorization for Ubrelvy 100MG  tablets is required/requested.   Insurance verification completed.   The patient is insured through [facility] [place] .   Per test claim: PA required; PA submitted to [facility] via CoverMyMeds Key/confirmation #/EOC [id number] Status is pending

## 2023-05-26 ENCOUNTER — Other Ambulatory Visit (HOSPITAL_COMMUNITY): Payer: Self-pay

## 2023-05-26 DIAGNOSIS — Z0271 Encounter for disability determination: Secondary | ICD-10-CM

## 2023-05-26 NOTE — Telephone Encounter (Signed)
Please relay medication has been approved.

## 2023-05-26 NOTE — Telephone Encounter (Signed)
Pharmacy Patient Advocate Encounter  Received notification from [facility] that Prior Authorization for Ubrelvy 100MG  tablets has been APPROVED from 05/25/2023 to 05/24/2024. Ran test claim, Copay is $4.00. This test claim was processed through [facility]- copay amounts may vary at other pharmacies due to pharmacy/plan contracts, or as the patient moves through the different stages of their insurance plan.   PA #/Case ID/Reference #: PA Case ID #: [id number]

## 2023-05-27 ENCOUNTER — Telehealth: Payer: Self-pay | Admitting: Neurology

## 2023-05-27 NOTE — Telephone Encounter (Signed)
Pt has called to report that she received a call that [employer](thru Medicaid) has approved her for Botox with no PA needed this will be done thru [facility] [phone] .  This information is being forwarded to Botox Coordinator

## 2023-06-03 ENCOUNTER — Telehealth: Payer: Self-pay | Admitting: Neurology

## 2023-06-03 NOTE — Telephone Encounter (Signed)
..   Pt understands that although there may be some limitations with this type of visit, we will take all precautions to reduce any security or privacy concerns.  Pt understands that this will be treated like an in office visit and we will file with pt's insurance, and there may be a patient responsible charge related to this service. ? ?

## 2023-06-08 ENCOUNTER — Other Ambulatory Visit: Payer: Self-pay | Admitting: Gastroenterology

## 2023-06-08 ENCOUNTER — Ambulatory Visit (HOSPITAL_COMMUNITY): Payer: Medicaid Other | Admitting: Clinical

## 2023-06-11 ENCOUNTER — Ambulatory Visit (INDEPENDENT_AMBULATORY_CARE_PROVIDER_SITE_OTHER): Payer: Medicaid Other | Admitting: Clinical

## 2023-06-11 DIAGNOSIS — F419 Anxiety disorder, unspecified: Secondary | ICD-10-CM | POA: Diagnosis not present

## 2023-06-11 DIAGNOSIS — F331 Major depressive disorder, recurrent, moderate: Secondary | ICD-10-CM

## 2023-06-11 DIAGNOSIS — F410 Panic disorder [episodic paroxysmal anxiety] without agoraphobia: Secondary | ICD-10-CM | POA: Diagnosis not present

## 2023-06-11 NOTE — Progress Notes (Signed)
Virtual Visit via Video Note   I connected with Kimberly Ortega on 06/11/23 at 8:00 AM EST by a video enabled telemedicine application and verified that I am speaking with the correct person using two identifiers.   Location: Patient: Home Provider: Office   I discussed the limitations of evaluation and management by telemedicine and the availability of in person appointments. The patient expressed understanding and agreed to proceed.   THERAPIST PROGRESS NOTE     Session Time: 8:00 AM-8:45 AM   Participation Level: Active   Behavioral Response: Casual and Alert,Depressed   Type of Therapy: Individual Therapy   Treatment Goals addressed: Depression and Anxiety   Interventions: CBT   Summary: Kimberly Ortega is a 57 y.o. female who presents with Depression with Anxiety and Panic Disorder. The OPT therapist worked with the patient for her ongoing OPT treatment. The OPT therapist utilized Motivational Interviewing to assist in creating therapeutic repore. The patient in the session was engaged and work in collaboration giving feedback about her triggers and symptoms over the past few days. The patient spoke about getting new insurance coverage that started in September and has been working to reconnect with providers now that she has insurance as her previous plan ended in July. The patients spoke about the work with her providers to manage her pain and physical health conditions. The patient spoke about her experience post most recent back surgery and notes she has not been able to take pain medication because it makes her nauseous and so she has been taking Tylenol to help with the Pain. The patient has learned the ex husband is seeing someone else in the past few days he went on vacation with another woman. The OPT therapist utilized Cognitive Behavioral Therapy through cognitive restructuring as well as worked with the patient on coping strategies to assist in management of mood. The OPT  therapist overviewed in session with the patient basic need areas examining the patients current eating habits, sleep schedule, exercise, and hygiene. The OPT therapist overviewed with the patient the last few weeks post the separation which her husband that occurred a few months ago. The patient spoke about her concern as husband owns the home that the patient has been staying in and her concern he would just throw her out of the home. The patient noted her husband has cancelled the auto insurance for her car, the tv streaming services, and her [employer] account. The patient was told simply , " Your going to have to get a job".   Suicidal/Homicidal: Nowithout intent/plan   Therapist Response:The OPT therapist worked with the patient for the patients scheduled session. The patient was engaged in her session and gave feedback in relation to triggers, symptoms, and behavior responses over the past few weeks. The OPT therapist worked with the patient utilizing an in session Cognitive Behavioral Therapy exercise. The patient was responsive in the session and verbalized, " My insurance ended back in July but my new cover started in September and so it has just taken me awhile to get my appointments set back up with my providers".The OPT therapist worked with the patient to challenge her negative thoughts and empower her to continue to work with her health providers in working her health problems and getting to desired outcomes. The OPT therapist worked with the patient on importance of utilizing coping skills and consistency with the recommendations ongoing of her physical health providers.The OPT therapist reviewed the patients current support network .The OPT therapist overviewed  with the patient should she go into crisis to report to nearest hospital for inpatient evaluation and treatment services. The patient spoke about her willingness if needed to call for emergency services and willingness to complete a  inpatient evaluation if needed.The OPT therapist will continue treatment work with the patient in her next scheduled session.   Plan: Return again in 2/3 weeks.   Diagnosis:      Axis I: Recurrent Moderate Major Depressive Disorder with Anxiety. Panic Disorder    Axis II: No diagnosis      Collaboration of Care: No additional collaboration of care for this session.   Patient/Guardian was advised Release of Information must be obtained prior to any record release in order to collaborate their care with an outside provider. Patient/Guardian was advised if they have not already done so to contact the registration department to sign all necessary forms in order for [name] to release information regarding their care.    Consent: Patient/Guardian gives verbal consent for treatment and assignment of benefits for services provided during this visit. Patient/Guardian expressed understanding and agreed to proceed.    I discussed the assessment and treatment plan with the patient. The patient was provided an opportunity to ask questions and all were answered. The patient agreed with the plan and demonstrated an understanding of the instructions.   The patient was advised to call back or seek an in-person evaluation if the symptoms worsen or if the condition fails to improve as anticipated.   I provided 45 minutes of non-face-to-face time during this encounter.   [name], LCSW   06/11/2023

## 2023-06-21 ENCOUNTER — Telehealth (INDEPENDENT_AMBULATORY_CARE_PROVIDER_SITE_OTHER): Payer: Medicaid Other | Admitting: Family Medicine

## 2023-06-21 ENCOUNTER — Telehealth: Payer: Self-pay | Admitting: Neurology

## 2023-06-21 ENCOUNTER — Encounter: Payer: Self-pay | Admitting: Family Medicine

## 2023-06-21 ENCOUNTER — Telehealth: Payer: Self-pay

## 2023-06-21 DIAGNOSIS — G4733 Obstructive sleep apnea (adult) (pediatric): Secondary | ICD-10-CM | POA: Diagnosis not present

## 2023-06-21 NOTE — Telephone Encounter (Signed)
Called pt and let her know per Kara Mead and Tammy from [facility] "Pt changed from [facility] to [facility]. Requires new F2F visit/sleep study to conitnue on CPAP therapy per [facility]. She will need to keep appt this afternoon with Amy.      Tammy/[facility] will let us know if pt needs in lab sleep study or home sleep study once she verifies with insurance." Pt verbalized understanding.

## 2023-06-21 NOTE — Patient Instructions (Signed)
Please continue using your CPAP regularly. While your insurance requires that you use CPAP at least 4 hours each night on 70% of the nights, I recommend, that you not skip any nights and use it throughout the night if you can. Getting used to CPAP and staying with the treatment long term does take time and patience and discipline. Untreated obstructive sleep apnea when it is moderate to severe can have an adverse impact on cardiovascular health and raise her risk for heart disease, arrhythmias, hypertension, congestive heart failure, stroke and diabetes. Untreated obstructive sleep apnea causes sleep disruption, nonrestorative sleep, and sleep deprivation. This can have an impact on your day to day functioning and cause daytime sleepiness and impairment of cognitive function, memory loss, mood disturbance, and problems focussing. Using CPAP regularly can improve these symptoms.  We will order repeat home sleep study per your new insurance requirements.   Follow up pending sleep study results

## 2023-06-21 NOTE — Telephone Encounter (Signed)
Called pt and she stated that her insurance said that she needed to have a Compliance Appointment in order to get her CPAP Supplies. Pt has changed insurances to [employer]. Pt states that [facility] won't give her new supplies until she has a visit with our office.  Called Melvern Sample at [facility] [phone] about pt's phone call and they stated that they were going to look into this and that they take [facility] and shouldn't need another visit to determine Compliance. Tammy said that she will call me back at [phone], do further discuss.

## 2023-06-21 NOTE — Progress Notes (Signed)
PATIENT: Kimberly Ortega DOB: 1965/11/22  REASON FOR VISIT: follow up HISTORY FROM: patient  Virtual Visit via Telephone Note  I connected with Kimberly Ortega on 06/21/23 at  2:00 PM EDT by telephone and verified that I am speaking with the correct person using two identifiers.   I discussed the limitations, risks, security and privacy concerns of performing an evaluation and management service by telephone and the availability of in person appointments. I also discussed with the patient that there may be a patient responsible charge related to this service. The patient expressed understanding and agreed to proceed.   History of Present Illness:  06/21/23 ALL: Kimberly Ortega is a 57 y.o. female here today for follow up. She was diagnosed with OSA following HST in 12/2022. Compliance visit with Kimberly Ortega 03/2023 showed excellent compliance and AHI well managed. She has switched insurance payers who is requiring she repeat HST. She is doing well on therapy. She continues to have significant fatigue and headaches. She is going through a divorce and endorses more stress.    History (copied from Kimberly Ortega's previous note)   Kimberly Ortega is a 57 y.o. female patient who is seen upon referral on 12/14/2022 from Kimberly Ortega/NP - Kimberly Kimberly Ortega  for a Sleep Consult.   The patient has back pain now for a year- and gained weight, is physically inactive, snores more. Night mares - moaning , groaning in her sleep- dreaming at the very onset of sleep- Obesity, chronic Migraine, snoring , hypnopompic hallucination, mind is racing, planned back surgery for sciatica soon.  Weight loss on optavia in 2019-2020  She is suffering from chronic insomnia.  She is suffering from anxiety and panic disorder.  Patient has been seen by Kimberly. Danae Ortega 03-20-2020 for new onset headache and again 08-28-2022 for a sciatica back pain, now further worked up by Kimberly. Maurice Ortega.  Had on May 21 st her expected  back surgery.   Epworth was 2/ 24 and FSS was  58/ 63 points (!)      Social history: not gainfully employed at this time, She has been in the midst of a divorce , and she is crying frequently which interfere with CPAP use.  She was  married for over 10 years. No children form this marriage, but a 22 year old child who lives far from her .    Observations/Objective:her HST documented a calculated pAHI (per hour) of  21.7/h                          REM pAHI: 50.4/h                                              NREM pAHI:    8.3/h                        Positional AHI:   Sleep was recorded exclusively in supine position.    CPAP compliance :    (7 % by days and 87% by hours, averaging over 6 hours of use per night. 5-15 cm water, 2 cm EPR and residual AHI of 1.1/h. minimal air leak. 95% pressure was  11.5/h.    Observations/Objective:  Generalized: Well developed, in no acute distress  Mentation: Alert oriented to time, place, history taking. Follows all commands  speech and language fluent   Assessment and Plan:  57 y.o. year old female  has a past medical history of Abdominal pain, Allergies, Anemia, Anxiety, Arthritis, Cancer (HCC), Complication of anesthesia (1991), Depression, Dyspnea, Family history of breast cancer, Fatty liver, GERD (gastroesophageal reflux disease) (03/01/2017), History of hiatal hernia, History of melanoma, History of recurrent UTIs, Migraine, Multiple nevi, Obstructive sleep apnea, Panic disorder (1995), and Polycystic ovarian disease. here with    ICD-10-CM   1. OSA (obstructive sleep apnea)  G47.33 Home sleep test     Kimberly Ortega is doing well on therapy, however, needs new HST in setting of new insurance coverage. I will place order for HST. She will continue current treatment plan. We will follow up pending results and requirements based on new payer.   Orders Placed This Encounter  Procedures   Home sleep test    Standing Status:   Future    Standing Expiration Date:    06/20/2024    Order Specific Question:   Where should this test be performed:    Answer:   [facility]    No orders of the defined types were placed in this encounter.   I provided 15 minutes of non-face-to-face time during this encounter. Patient located at their place of residence during Mychart visit. Provider is in the office.    Kimberly Dapper, NP

## 2023-06-21 NOTE — Telephone Encounter (Signed)
Pt said follow up was scheduled with Dr. Vickey Huger 08/10/23, but need an earlier. Have rescheduled an CPAP follow up; MyChart Video appt with Amy for today.  Pt informed have schedule your Amy with for today at 2:00 pm. Pt verbalized understand

## 2023-06-21 NOTE — Telephone Encounter (Signed)
Spoke w/ Tammy/[facility]. Pt changed from [facility] to [facility] insurance. Requires new F2F visit/sleep study to conitnue on CPAP therapy per [facility]. She will need to keep appt this afternoon with Amy.    Tammy/[facility] will let us know if pt needs in lab sleep study or home sleep study once she verifies with insurance.

## 2023-06-23 ENCOUNTER — Telehealth: Payer: Self-pay | Admitting: Family Medicine

## 2023-06-23 NOTE — Telephone Encounter (Signed)
HST- [facility] pending faxed notes.

## 2023-06-28 ENCOUNTER — Telehealth: Payer: Self-pay | Admitting: Family Medicine

## 2023-06-28 NOTE — Telephone Encounter (Signed)
Pt called needing a refill on her ondansetron (ZOFRAN-ODT) 4 MG disintegrating tablet and is needing it sent to the [facility] pharmacy from now on. Pt stated that she tried to do it on Mychart but was not able to.

## 2023-06-29 ENCOUNTER — Telehealth: Payer: Self-pay | Admitting: Family Medicine

## 2023-06-29 ENCOUNTER — Encounter: Payer: Self-pay | Admitting: Family Medicine

## 2023-06-29 MED ORDER — ONDANSETRON 4 MG PO TBDP: ORAL_TABLET | ORAL | 1 refills | Status: DC

## 2023-06-29 NOTE — Telephone Encounter (Signed)
Pt said Need to know if order for sleep study have been sent to new insurance. Have not heard anything about scheduling sleep study.

## 2023-06-29 NOTE — Telephone Encounter (Signed)
Called pt back and pt stated that her sleep study had gotten denied and she is wanting [name] to do the appeal for her sleep study today. Told pt that I will give her information to the sleep lab and they will call her back.

## 2023-06-29 NOTE — Telephone Encounter (Signed)
Last seen on 06/21/23 Scheduled for botox on 07/06/23 Rx sent

## 2023-06-30 ENCOUNTER — Other Ambulatory Visit: Payer: Self-pay

## 2023-06-30 MED ORDER — ONDANSETRON 4 MG PO TBDP: ORAL_TABLET | ORAL | 1 refills | Status: DC

## 2023-07-02 ENCOUNTER — Telehealth (HOSPITAL_COMMUNITY): Payer: Self-pay | Admitting: Clinical

## 2023-07-02 ENCOUNTER — Other Ambulatory Visit: Payer: Self-pay

## 2023-07-02 ENCOUNTER — Encounter (HOSPITAL_COMMUNITY): Payer: Self-pay

## 2023-07-02 ENCOUNTER — Ambulatory Visit (HOSPITAL_COMMUNITY): Payer: Medicaid Other | Admitting: Clinical

## 2023-07-02 ENCOUNTER — Emergency Department (HOSPITAL_COMMUNITY): Payer: Medicaid Other

## 2023-07-02 ENCOUNTER — Emergency Department (HOSPITAL_COMMUNITY)
Admission: EM | Admit: 2023-07-02 | Discharge: 2023-07-02 | Disposition: A | Discharge status: Home / Self Care | Payer: Medicaid Other | Attending: Emergency Medicine | Admitting: Emergency Medicine

## 2023-07-02 DIAGNOSIS — J45909 Unspecified asthma, uncomplicated: Secondary | ICD-10-CM | POA: Insufficient documentation

## 2023-07-02 DIAGNOSIS — Z91040 Latex allergy status: Secondary | ICD-10-CM | POA: Diagnosis not present

## 2023-07-02 DIAGNOSIS — R0789 Other chest pain: Secondary | ICD-10-CM | POA: Diagnosis present

## 2023-07-02 LAB — CBC
HCT: 44 % (ref 36.0–46.0)
Hemoglobin: 14 g/dL (ref 12.0–15.0)
MCH: 27.8 pg (ref 26.0–34.0)
MCHC: 31.8 g/dL (ref 30.0–36.0)
MCV: 87.3 fL (ref 80.0–100.0)
Platelets: 298 10*3/uL (ref 150–400)
RBC: 5.04 MIL/uL (ref 3.87–5.11)
RDW: 13 % (ref 11.5–15.5)
WBC: 10 10*3/uL (ref 4.0–10.5)
nRBC: 0 % (ref 0.0–0.2)

## 2023-07-02 LAB — BASIC METABOLIC PANEL
Anion gap: 8 (ref 5–15)
BUN: 13 mg/dL (ref 6–20)
CO2: 28 mmol/L (ref 22–32)
Calcium: 9.1 mg/dL (ref 8.9–10.3)
Chloride: 100 mmol/L (ref 98–111)
Creatinine, Ser: 0.97 mg/dL (ref 0.44–1.00)
GFR, Estimated: >60 mL/min (ref >60)
Glucose, Bld: 91 mg/dL (ref 70–99)
Potassium: 4.2 mmol/L (ref 3.5–5.1)
Sodium: 136 mmol/L (ref 135–145)

## 2023-07-02 LAB — TROPONIN I (HIGH SENSITIVITY)
Troponin I (High Sensitivity): <2 ng/L (ref <18)
Troponin I (High Sensitivity): 2 ng/L (ref <18)

## 2023-07-02 NOTE — Telephone Encounter (Signed)
The patient did respond for session, however, is in crisis and is being transported by her sister to local ER for evaluation due to having severe chest pains over last 24hrs.

## 2023-07-02 NOTE — Discharge Instructions (Signed)
Workup today for the chest pain without any acute findings.  But recommend that you do follow-up with cardiology make an appointment.  Return for any new or worse symptoms.

## 2023-07-02 NOTE — ED Provider Triage Note (Signed)
Emergency Medicine Provider Triage Evaluation Note  Kimberly Ortega , a 57 y.o. female  was evaluated in triage.  Pt complains of chest pain since last night, no increase in her baseline shortness of breath.  Pain is constant to the left anterior chest area becomes sharp and stinging intermittently.  No radiation..  Review of Systems  Positive: Chest pain Negative: Lower extremity pain, shortness of breath, cough, fever  Physical Exam  BP 128/81   Pulse 75   Temp 98.3 F (36.8 C) (Oral)   Resp 18   Ht 5\' 7"  (1.702 m)   Wt 115.7 kg   SpO2 95%   BMI 39.94 kg/m  Gen:   Awake, no distress   Resp:  Normal effort  MSK:   Moves extremities without difficulty  Other:    Medical Decision Making  Medically screening exam initiated at 1:35 PM.  Appropriate orders placed.  Kimberly Ortega was informed that the remainder of the evaluation will be completed by another provider, this initial triage assessment does not replace that evaluation, and the importance of remaining in the ED until their evaluation is complete.     Kimberly Ortega A, PA-C 07/02/23 1340

## 2023-07-02 NOTE — ED Provider Notes (Signed)
[facility]GENC[facility] DEPARTMENT AT [facility] Provider Note   CSN: [id number] Arrival date & time: 07/02/23  1219     History  Chief Complaint  Patient presents with   Chest Pain    Kimberly Ortega is a 57 y.o. female.  Patient with some chest discomfort prior to 3 in the morning but she awoke with more severe pain at 3 in the morning.  At that time took 2 baby aspirin and 1 mg of a benzo diazepam.  Patient does have a history of panic disorders.  Has chest pain at times.  But this was different.  This was originally center of the chest but then moved to the left side of the chest.  Patient currently pain-free and was pain-free as of 1230 this afternoon and no pain is come back.  Past medical history significant for history of panic attack history of asthma history of polycystic ovary disease history of sleep apnea history of gastroesophageal reflux disease history of panic disorder past surgical history significant for gallbladder removal and laparoscopic gastric banding in 2012 and lumbar laminectomy in May 2024 and then repeat in July 2024       Home Medications Prior to Admission medications   Medication Sig Start Date End Date Taking? Authorizing Provider  acetaminophen (TYLENOL) 500 MG tablet Take 1,000 mg by mouth every 6 (six) hours as needed for moderate pain.    [provider]  ALPRAZolam Prudy Feeler) 1 MG tablet Take 1 mg by mouth in the morning, at noon, in the evening, and at bedtime.    [provider]  ascorbic acid (VITAMIN C) 500 MG tablet Take 500 mg by mouth daily.    [provider]  Biotin w/ Vitamins C & E (HAIR/SKIN/NAILS PO) Take 1 tablet by mouth daily.    [provider]  botulinum toxin Type A (BOTOX) 200 units injection FOR OFFICE ADMINISTRATION. PHYSICIAN TO INJECT 155 UNITS INTRAMUSCULARLY INTO MUSCLES OF HEAD & NECK EVERY 12 WEEKS.  DISCARD REMAINDER. 06/29/22   Lomax, Amy, NP  busPIRone (BUSPAR) 10 MG tablet Take  10 mg by mouth 2 (two) times daily. 04/05/23   [provider]  cyclobenzaprine (FLEXERIL) 10 MG tablet Take 1 tablet (10 mg total) by mouth 3 (three) times daily as needed for muscle spasms. 04/20/23   Iran Sizer, PA-C  dexlansoprazole (DEXILANT) 60 MG capsule Take 1 capsule (60 mg total) by mouth daily. 03/02/23   Aida Raider, NP  famotidine (PEPCID) 20 MG tablet Take 1 tablet (20 mg total) by mouth at bedtime as needed for heartburn or indigestion. Patient taking differently: Take 20 mg by mouth at bedtime. 10/04/19   Rehman, Joline Maxcy, MD  ketoconazole (NIZORAL) 2 % cream Apply 1 Application topically 2 (two) times daily as needed for irritation. 04/01/23   [provider]  KLAYESTA powder Apply 1 Application topically daily as needed (yeast). 03/31/23   [provider]  levonorgestrel (MIRENA) 20 MCG/DAY IUD 1 each by Intrauterine route once.    [provider]  metFORMIN (GLUCOPHAGE) 1000 MG tablet Take 1,000 mg by mouth daily as needed (When not on Ozempic). Patient not currently taking 12/29/21   [provider]  MONUROL 3 g PACK Take 3 g by mouth daily as needed (UTI). 02/11/17   [provider]  MULTIPLE VITAMIN PO Take 1 tablet by mouth daily.    [provider]  NON FORMULARY Pt uses a cpap nighlty    [provider]  ondansetron (ZOFRAN) 4 MG tablet Take 1 tablet (4 mg total) by mouth every 6 (six) hours as needed for nausea or vomiting. 04/20/23   Cosentino, Talmadge Chad, PA-C  ondansetron (ZOFRAN-ODT) 4 MG disintegrating tablet DISSOLVE 1 TABLET(4 MG) ON THE TONGUE EVERY 8 HOURS AS NEEDED FOR NAUSEA OR VOMITING 06/30/23   Lomax, Amy, NP  Oxycodone HCl 10 MG TABS Take 1 tablet (10 mg total) by mouth every 8 (eight) hours as needed ((score 7 to 10)). 04/20/23   Cosentino, Allison R, PA-C  OZEMPIC, 1 MG/DOSE, 4 MG/3ML SOPN Inject 1 mg into the skin once a week. Patient not currently not taking Patient not taking:  Reported on 04/15/2023    [provider]  Probiotic Product (ALIGN) CHEW Chew 1 capsule by mouth daily.    [provider]  Propylene Glycol (SYSTANE COMPLETE) 0.6 % SOLN Place 1 drop into both eyes as needed (dry eyes).    [provider]  sucralfate (CARAFATE) 1 g tablet Take 1 tablet (1 g total) by mouth 3 (three) times daily as needed (stomach issues). 06/08/23 09/06/23  Aida Raider, NP  sucralfate (CARAFATE) 1 GM/10ML suspension Take 10 mLs (1 g total) by mouth 4 (four) times daily -  with meals and at bedtime. 04/15/23   Aida Raider, NP  Ubrogepant (UBRELVY) 100 MG TABS Take 1 tablet (100 mg total) by mouth as needed. 12/28/22   Lomax, Amy, NP  VITAMIN A PO Take 1 tablet by mouth daily.    [provider]      Allergies    Ibuprofen, Macrobid [nitrofurantoin macrocrystal], Baclofen, Morphine and codeine, Topiramate, Tramadol, Latex, Other, and Pseudoephedrine    Review of Systems   Review of Systems  Constitutional:  Negative for chills and fever.  HENT:  Negative for ear pain and sore throat.   Eyes:  Negative for pain and visual disturbance.  Respiratory:  Negative for cough and shortness of breath.   Cardiovascular:  Positive for chest pain. Negative for palpitations.  Gastrointestinal:  Negative for abdominal pain and vomiting.  Genitourinary:  Negative for dysuria and hematuria.  Musculoskeletal:  Negative for arthralgias and back pain.  Skin:  Negative for color change and rash.  Neurological:  Negative for seizures and syncope.  All other systems reviewed and are negative.   Physical Exam Updated Vital Signs BP 125/69   Pulse 70   Temp 98.3 F (36.8 C) (Oral)   Resp 19   Ht 1.702 m (5\' 7" )   Wt 115.7 kg   SpO2 94%   BMI 39.94 kg/m  Physical Exam Vitals and nursing note reviewed.  Constitutional:      General: She is not in acute distress.    Appearance: Normal appearance. She is well-developed. She is not  ill-appearing.  HENT:     Head: Normocephalic and atraumatic.  Eyes:     Conjunctiva/sclera: Conjunctivae normal.  Cardiovascular:     Rate and Rhythm: Normal rate and regular rhythm.     Heart sounds: No murmur heard. Pulmonary:     Effort: Pulmonary effort is normal. No respiratory distress.     Breath sounds: Normal breath sounds.  Abdominal:     Palpations: Abdomen is soft.     Tenderness: There is no abdominal tenderness.  Musculoskeletal:        General: No swelling.     Cervical back: Neck supple.  Skin:    General: Skin is warm and dry.  Capillary Refill: Capillary refill takes less than 2 seconds.  Neurological:     General: No focal deficit present.     Mental Status: She is alert and oriented to person, place, and time.  Psychiatric:        Mood and Affect: Mood normal.     ED Results / Procedures / Treatments   Labs (all labs ordered are listed, but only abnormal results are displayed) Labs Reviewed  BASIC METABOLIC PANEL  CBC  TROPONIN I (HIGH SENSITIVITY)  TROPONIN I (HIGH SENSITIVITY)    EKG EKG Interpretation Date/Time:  Friday July 02 2023 12:34:06 EDT Ventricular Rate:  88 PR Interval:  132 QRS Duration:  76 QT Interval:  358 QTC Calculation: 433 R Axis:   167  Text Interpretation: Suspect arm lead reversal, interpretation assumes no reversal Unusual P axis, possible ectopic atrial rhythm Lateral infarct , age undetermined Inferior infarct , age undetermined Abnormal ECG When compared with ECG of 28-Jan-2023 13:10, Ectopic atrial rhythm has replaced Sinus rhythm QRS axis Shifted right Inferior infarct is now Present T wave inversion now evident in Lateral leads Confirmed by Vanetta Mulders [phone]) on 07/02/2023 4:22:19 PM  Radiology DG Chest 2 View  Result Date: 07/02/2023 CLINICAL DATA:  Chest pain EXAM: CHEST - 2 VIEW COMPARISON:  Chest radiograph dated 01/28/2023 FINDINGS: Normal lung volumes. No focal consolidations. No pleural  effusion or pneumothorax. The heart size and mediastinal contours are within normal limits. No acute osseous abnormality. IMPRESSION: No active cardiopulmonary disease. Electronically Signed   By: Agustin Cree M.D.   On: 07/02/2023 15:28    Procedures Procedures    Medications Ordered in ED Medications - No data to display  ED Course/ Medical Decision Making/ A&P                                 Medical Decision Making Amount and/or Complexity of Data Reviewed Labs: ordered. Radiology: ordered.  Delta troponins x 2 both to her last.  CBC normal basic metabolic panel normal to include renal function.  Chest x-ray without any acute findings.  EKG with limb reversal but otherwise no significant change from before.  Patient based on her age needs to follow back up with cardiology.  Patient currently chest pain-free. Final Clinical Impression(s) / ED Diagnoses Final diagnoses:  Atypical chest pain    Rx / DC Orders ED Discharge Orders     None         Vanetta Mulders, MD 07/02/23 1707

## 2023-07-02 NOTE — ED Triage Notes (Signed)
Chest pain, woke pt up at 3am. Center of the chest radiating to the LEFT side of chest. Took 2 baby ASA and 1mg  alprazolam  Pt stated she has panic disorders and has chest pain all the time. Today it is different than normal feels sharp like a bee sting.   Currently pain free

## 2023-07-05 NOTE — Telephone Encounter (Signed)
I called [employer] and spoke with Cyndia Diver she informed me it was denied.   The process to do an appeal to submit support documentation. A letter written on the reason why this home sleep study is needed.  Once it has all been submitted they informed me it is a 30 calendar days turn around time.

## 2023-07-05 NOTE — Telephone Encounter (Signed)
Kimberly Ortega- I spoke with Irving Burton. No specific reason for denial other than they needed supporting documentation. She did support your office note stating per insurance requirements. Did you want to write a letter at this point to submit? It will take 30 days to review once submitted.

## 2023-07-05 NOTE — Progress Notes (Unsigned)
07/05/23 ALL: Kimberly Ortega returns for Botox. She continues to do well. Migraines wax and wane but usually fairly well managed. She uses Ubrelvy for abortive therapy. Usually takes about 6-8 tablets a month.   04/13/2023 ALL: Kimberly Ortega returns for Botox. She is doing well on therapy. Kimberly Ortega works well. She was started on CPAP therapy but unsure if it has been beneficial. Compliance review with Dr Vickey Huger 03/30/2023 showed excellent compliance and residual AHI well managed.   01/18/2023 ALL: Kimberly Ortega returns for Botox. She reports doing better. Averaging about 6 migraine days a month. Kimberly Ortega does seem to help. She is averaging 3-4 doses per month. Rarely using rizatriptan.   10/22/2022 ALL: Kimberly Ortega returns for Botox.She reports migraines have improved by at least 50%. Nurtec did not seem much more effective than rizatriptan.   07/27/2022 ALL: Kimberly Ortega presents for her first Botox procedure. Baseline 15 migraine days per month. She was previously taking amitriptyline 20mg  at bedtime and propranolol LA 60mg  daily. She discontinued both propranolol and amitriptyline a few weeks ago. She continues rizatriptan as needed.     Consent Form Botulism Toxin Injection For Chronic Migraine    Reviewed orally with patient, additionally signature is on file:  Botulism toxin has been approved by the Federal drug administration for treatment of chronic migraine. Botulism toxin does not cure chronic migraine and it may not be effective in some patients.  The administration of botulism toxin is accomplished by injecting a small amount of toxin into the muscles of the neck and head. Dosage must be titrated for each individual. Any benefits resulting from botulism toxin tend to wear off after 3 months with a repeat injection required if benefit is to be maintained. Injections are usually done every 3-4 months with maximum effect peak achieved by about 2 or 3 weeks. Botulism toxin is expensive and you should be sure of what  costs you will incur resulting from the injection.  The side effects of botulism toxin use for chronic migraine may include:   -Transient, and usually mild, facial weakness with facial injections  -Transient, and usually mild, head or neck weakness with head/neck injections  -Reduction or loss of forehead facial animation due to forehead muscle weakness  -Eyelid drooping  -Dry eye  -Pain at the site of injection or bruising at the site of injection  -Double vision  -Potential unknown long term risks   Contraindications: You should not have Botox if you are pregnant, nursing, allergic to albumin, have an infection, skin condition, or muscle weakness at the site of the injection, or have myasthenia gravis, Lambert-Eaton syndrome, or ALS.  It is also possible that as with any injection, there may be an allergic reaction or no effect from the medication. Reduced effectiveness after repeated injections is sometimes seen and rarely infection at the injection site may occur. All care will be taken to prevent these side effects. If therapy is given over a long time, atrophy and wasting in the muscle injected may occur. Occasionally the patient's become refractory to treatment because they develop antibodies to the toxin. In this event, therapy needs to be modified.  I have read the above information and consent to the administration of botulism toxin.    BOTOX PROCEDURE NOTE FOR MIGRAINE HEADACHE  Contraindications and precautions discussed with patient(above). Aseptic procedure was observed and patient tolerated procedure. Procedure performed by Shawnie Dapper, FNP-C.   The condition has existed for more than 6 months, and pt does not have a diagnosis of ALS,  Myasthenia Gravis or Lambert-Eaton Syndrome.  Risks and benefits of injections discussed and pt agrees to proceed with the procedure.  Written consent obtained  These injections are medically necessary. Pt  receives good benefits from these  injections. These injections do not cause sedations or hallucinations which the oral therapies may cause.   Description of procedure:  The patient was placed in a sitting position. The standard protocol was used for Botox as follows, with 5 units of Botox injected at each site:  -Procerus muscle, midline injection  -Corrugator muscle, bilateral injection  -Frontalis muscle, bilateral injection, with 2 sites each side, medial injection was performed in the upper one third of the frontalis muscle, in the region vertical from the medial inferior edge of the superior orbital rim. The lateral injection was again in the upper one third of the forehead vertically above the lateral limbus of the cornea, 1.5 cm lateral to the medial injection site.  -Temporalis muscle injection, 4 sites, bilaterally. The first injection was 3 cm above the tragus of the ear, second injection site was 1.5 cm to 3 cm up from the first injection site in line with the tragus of the ear. The third injection site was 1.5-3 cm forward between the first 2 injection sites. The fourth injection site was 1.5 cm posterior to the second injection site. 5th site laterally in the temporalis  muscleat the level of the outer canthus.  -Occipitalis muscle injection, 3 sites, bilaterally. The first injection was done one half way between the occipital protuberance and the tip of the mastoid process behind the ear. The second injection site was done lateral and superior to the first, 1 fingerbreadth from the first injection. The third injection site was 1 fingerbreadth superiorly and medially from the first injection site.  -Cervical paraspinal muscle injection, 2 sites, bilaterally. The first injection site was 1 cm from the midline of the cervical spine, 3 cm inferior to the lower border of the occipital protuberance. The second injection site was 1.5 cm superiorly and laterally to the first injection site.  -Trapezius muscle injection was  performed at 3 sites, bilaterally. The first injection site was in the upper trapezius muscle halfway between the inflection point of the neck, and the acromion. The second injection site was one half way between the acromion and the first injection site. The third injection was done between the first injection site and the inflection point of the neck.   Will return for repeat injection in 3 months.   A total of 200 units of Botox was prepared, 155 units of Botox was injected as documented above, any Botox not injected was wasted. The patient tolerated the procedure well, there were no complications of the above procedure.

## 2023-07-05 NOTE — Telephone Encounter (Signed)
[employer] denied the HST. Please see phone note from 06/29/23 an appeal is being submitted.

## 2023-07-05 NOTE — Telephone Encounter (Signed)
Faxed appeal letter to [phone]. Received fax confirmation.

## 2023-07-06 ENCOUNTER — Ambulatory Visit: Payer: Medicaid Other | Admitting: Family Medicine

## 2023-07-06 DIAGNOSIS — G43109 Migraine with aura, not intractable, without status migrainosus: Secondary | ICD-10-CM | POA: Diagnosis not present

## 2023-07-06 MED ORDER — ONABOTULINUMTOXINA 200 UNITS IJ SOLR
155.0000 [IU] | Freq: Once | INTRAMUSCULAR | Status: AC
Start: 2023-07-06 — End: 2023-07-06
  Administered 2023-07-06: 155 [IU] via INTRAMUSCULAR

## 2023-07-06 NOTE — Telephone Encounter (Signed)
Noted, thank you

## 2023-07-06 NOTE — Progress Notes (Signed)
Botox- 100 units x 2 vials Lot: [id number] Expiration: 07/2025 NDC: 4540-9811-91  Bacteriostatic 0.9% Sodium Chloride- 2 mL  [id number] Expiration: 12/21/2023 NDC: 3086-5784-69  Dx: G43.109 B/B Witnessed GE:XBMW Kimberly Ortega

## 2023-07-08 NOTE — Telephone Encounter (Signed)
LVM for Tammy/[facility] to call back to discuss patient

## 2023-07-08 NOTE — Telephone Encounter (Signed)
[place] with [employer] with the appeal team called me and stated they are going to wave the sleep study and they just need an order for the CPAP and what DME company she is using.   Her direct number is [phone] & her fax number is [phone] she stated you can attn: [place] on the fax.

## 2023-07-08 NOTE — Telephone Encounter (Signed)
Took call from [place] in phone room. Spoke w/ Tammy. She will pull documentation from 06/29/23 phone note and see if they can proceed with things now. She will let us know if anything else is needed moving forward.

## 2023-07-16 ENCOUNTER — Ambulatory Visit (INDEPENDENT_AMBULATORY_CARE_PROVIDER_SITE_OTHER): Payer: Medicaid Other | Admitting: Clinical

## 2023-07-16 DIAGNOSIS — F410 Panic disorder [episodic paroxysmal anxiety] without agoraphobia: Secondary | ICD-10-CM

## 2023-07-16 DIAGNOSIS — F419 Anxiety disorder, unspecified: Secondary | ICD-10-CM | POA: Diagnosis not present

## 2023-07-16 DIAGNOSIS — F331 Major depressive disorder, recurrent, moderate: Secondary | ICD-10-CM

## 2023-07-16 NOTE — Progress Notes (Signed)
Virtual Visit via Video Note   I connected with Kimberly Ortega on 07/16/23 at 11:00 AM EST by a video enabled telemedicine application and verified that I am speaking with the correct person using two identifiers.   Location: Patient: Home Provider: Office   I discussed the limitations of evaluation and management by telemedicine and the availability of in person appointments. The patient expressed understanding and agreed to proceed.   THERAPIST PROGRESS NOTE     Session Time: 11:00 AM-11:45 AM   Participation Level: Active   Behavioral Response: Casual and Alert,Depressed   Type of Therapy: Individual Therapy   Treatment Goals addressed: Depression and Anxiety   Interventions: CBT   Summary: Kimberly Ortega is a 57 y.o. female who presents with Depression with Anxiety and Panic Disorder. The OPT therapist worked with the patient for her ongoing OPT treatment. The OPT therapist utilized Motivational Interviewing to assist in creating therapeutic repore. The patient in the session was engaged and work in collaboration giving feedback about her triggers and symptoms over the past few days. The patient spoke about going to the hospital for chest pain and being evaluated in which it was confirmed she was not having a heart attack and as part of follow up she was referred to Cardiologist to further evaluate if she before this event has had any heart damage from any prior chest pain events she noted she has experienced . The patients spoke about the work with her providers to manage her pain and physical health conditions. The patient spoke about her experience post most recent back surgery and notes she feels this has not helped and has made her back pain worse. The patient is going through separation with her ex husband who she notes is seeing someone else. The OPT therapist utilized Cognitive Behavioral Therapy through cognitive restructuring as well as worked with the patient on coping strategies  to assist in management of mood. The OPT therapist overviewed in session with the patient basic need areas examining the patients current eating habits, sleep schedule, exercise, and hygiene. The OPT therapist overviewed with the patient the last few weeks post the separation the patient noted she feels a little less worried about just being thrown out of the home and being homeless due to her lack of resources and lack of income. The patient noted her husband and his sister who ownes the home may shut off the utilities as a tactic to try to get her to leave.   Suicidal/Homicidal: Nowithout intent/plan   Therapist Response:The OPT therapist worked with the patient for the patients scheduled session. The patient was engaged in her session and gave feedback in relation to triggers, symptoms, and behavior responses over the past few weeks. The OPT therapist worked with the patient utilizing an in session Cognitive Behavioral Therapy exercise. The patient was responsive in the session and verbalized, " I did go to the hospital and I had to sit there for hours but they confirmed that I have not had a heart attack , however, they did give me a referral to a Cardiologist".The OPT therapist worked with the patient to challenge her negative thoughts and empower her to continue to work with her health providers in working her health problems and getting to desired outcomes. The OPT therapist worked with the patient on importance of utilizing coping skills and consistency with the recommendations ongoing of her physical health providers.The OPT therapist reviewed the patients current support network .The OPT therapist overviewed with the patient  should she go into crisis to report to nearest hospital for inpatient evaluation and treatment services. The OPT therapist overviewed through in session exercise the in my control vs not in my control exercise.The OPT therapist will continue treatment work with the patient in her  next scheduled session.   Plan: Return again in 2/3 weeks.   Diagnosis:      Axis I: Recurrent Moderate Major Depressive Disorder with Anxiety. Panic Disorder    Axis II: No diagnosis      Collaboration of Care: No additional collaboration of care for this session.   Patient/Guardian was advised Release of Information must be obtained prior to any record release in order to collaborate their care with an outside provider. Patient/Guardian was advised if they have not already done so to contact the registration department to sign all necessary forms in order for [name] to release information regarding their care.    Consent: Patient/Guardian gives verbal consent for treatment and assignment of benefits for services provided during this visit. Patient/Guardian expressed understanding and agreed to proceed.    I discussed the assessment and treatment plan with the patient. The patient was provided an opportunity to ask questions and all were answered. The patient agreed with the plan and demonstrated an understanding of the instructions.   The patient was advised to call back or seek an in-person evaluation if the symptoms worsen or if the condition fails to improve as anticipated.   I provided 45 minutes of non-face-to-face time during this encounter.   [name], LCSW   07/16/2023

## 2023-07-19 NOTE — Telephone Encounter (Signed)
Sent email to Tammy G/[facility] checking on this. Waiting on response.

## 2023-07-19 NOTE — Telephone Encounter (Signed)
Update from Tammy/[facility]: "I just called the patient, and she said our supply team had just called her. They're shipping her supplies today."

## 2023-07-19 NOTE — Telephone Encounter (Signed)
[place] with [employer] left me a voicemail stating she has received the CPAP order yet. Her direct number is [phone] & her fax number is [phone]

## 2023-07-22 ENCOUNTER — Ambulatory Visit (INDEPENDENT_AMBULATORY_CARE_PROVIDER_SITE_OTHER): Payer: Medicaid Other | Admitting: Gastroenterology

## 2023-07-29 ENCOUNTER — Encounter: Payer: Medicaid Other | Admitting: Diagnostic Neuroimaging

## 2023-08-02 ENCOUNTER — Other Ambulatory Visit: Payer: Self-pay

## 2023-08-02 ENCOUNTER — Encounter: Payer: Self-pay | Admitting: Family Medicine

## 2023-08-02 DIAGNOSIS — G43109 Migraine with aura, not intractable, without status migrainosus: Secondary | ICD-10-CM

## 2023-08-02 MED ORDER — UBRELVY 100 MG PO TABS
1.0000 | ORAL_TABLET | ORAL | 5 refills | Status: DC | PRN
Start: 2023-08-02 — End: 2023-08-02

## 2023-08-02 MED ORDER — UBRELVY 100 MG PO TABS
1.0000 | ORAL_TABLET | ORAL | 5 refills | Status: DC | PRN
Start: 2023-08-02 — End: 2023-12-14

## 2023-08-09 ENCOUNTER — Ambulatory Visit (INDEPENDENT_AMBULATORY_CARE_PROVIDER_SITE_OTHER): Payer: Medicaid Other | Admitting: Clinical

## 2023-08-09 DIAGNOSIS — F419 Anxiety disorder, unspecified: Secondary | ICD-10-CM | POA: Diagnosis not present

## 2023-08-09 DIAGNOSIS — F331 Major depressive disorder, recurrent, moderate: Secondary | ICD-10-CM | POA: Diagnosis not present

## 2023-08-09 DIAGNOSIS — F410 Panic disorder [episodic paroxysmal anxiety] without agoraphobia: Secondary | ICD-10-CM | POA: Diagnosis not present

## 2023-08-09 NOTE — Progress Notes (Signed)
Virtual Visit via Video Note   I connected with Kimberly Ortega on 08/09/23 at 3:00 PM EST by a video enabled telemedicine application and verified that I am speaking with the correct person using two identifiers.   Location: Patient: Home Provider: Office   I discussed the limitations of evaluation and management by telemedicine and the availability of in person appointments. The patient expressed understanding and agreed to proceed.   THERAPIST PROGRESS NOTE     Session Time: 3:00 PM-3:45 PM   Participation Level: Active   Behavioral Response: Casual and Alert,Depressed   Type of Therapy: Individual Therapy   Treatment Goals addressed: Depression and Anxiety   Interventions: CBT   Summary: Kimberly Ortega is a 57 y.o. female who presents with Depression with Anxiety and Panic Disorder. The OPT therapist worked with the patient for her ongoing OPT treatment. The OPT therapist utilized Motivational Interviewing to assist in creating therapeutic repore. The patient in the session was engaged and work in collaboration giving feedback about her triggers and symptoms over the past few days. The patient spoke about her nerve surgery getting rescheduled from November to December. The patient noted she did go to a lawyer to get legal advice about her rights in relation about her separation from her husband. The OPT therapist utilized Cognitive Behavioral Therapy through cognitive restructuring as well as worked with the patient on coping strategies to assist in management of mood. The OPT therapist overviewed in session with the patient basic need areas examining the patients current eating habits, sleep schedule, exercise, and hygiene. The OPT therapist overviewed with the patient the last few weeks post the separation the patient noted she feels a little less worried about just being thrown out of the home and being homeless due to her lack of resources and lack of income. The patient notes she  continues to struggle with her anxiety, however, her physicians noted to the patient that until her life situation/circumstances her difficulty with anxiety wont change and they will not change her medication. The patient is currently in the process of getting advice around her legal rights as she moves forward in her separation/divorce from her ex-husband.   Suicidal/Homicidal: Nowithout intent/plan   Therapist Response:The OPT therapist worked with the patient for the patients scheduled session. The patient was engaged in her session and gave feedback in relation to triggers, symptoms, and behavior responses over the past few weeks. The OPT therapist worked with the patient utilizing an in session Cognitive Behavioral Therapy exercise. The patient was responsive in the session and verbalized, " I had to reschedule my nerve procedure  for my back until December".The OPT therapist worked with the patient to challenge her negative thoughts and empower her to continue to work with her health providers in working her health problems and getting to desired outcomes. The OPT therapist worked with the patient on importance of utilizing coping skills and consistency with the recommendations ongoing of her physical health providers.The OPT therapist reviewed the patients current support network .The OPT therapist overviewed with the patient should she go into crisis to report to nearest hospital for inpatient evaluation and treatment services. The OPT therapist overviewed through in session exercise the in my control vs not in my control exercise.The patient continues to work to get representation and legal advice in relation to her current situation (separation/divorce).The OPT therapist will continue treatment work with the patient in her next scheduled session.   Plan: Return again in 2/3 weeks.   Diagnosis:  Axis I: Recurrent Moderate Major Depressive Disorder with Anxiety. Panic Disorder    Axis II: No  diagnosis      Collaboration of Care: No additional collaboration of care for this session.   Patient/Guardian was advised Release of Information must be obtained prior to any record release in order to collaborate their care with an outside provider. Patient/Guardian was advised if they have not already done so to contact the registration department to sign all necessary forms in order for [name] to release information regarding their care.    Consent: Patient/Guardian gives verbal consent for treatment and assignment of benefits for services provided during this visit. Patient/Guardian expressed understanding and agreed to proceed.    I discussed the assessment and treatment plan with the patient. The patient was provided an opportunity to ask questions and all were answered. The patient agreed with the plan and demonstrated an understanding of the instructions.   The patient was advised to call back or seek an in-person evaluation if the symptoms worsen or if the condition fails to improve as anticipated.   I provided 45 minutes of non-face-to-face time during this encounter.   [name], LCSW   08/09/2023

## 2023-08-10 ENCOUNTER — Telehealth: Payer: Medicaid Other | Admitting: Neurology

## 2023-08-12 ENCOUNTER — Ambulatory Visit (INDEPENDENT_AMBULATORY_CARE_PROVIDER_SITE_OTHER): Payer: Self-pay | Admitting: Diagnostic Neuroimaging

## 2023-08-12 ENCOUNTER — Ambulatory Visit (INDEPENDENT_AMBULATORY_CARE_PROVIDER_SITE_OTHER): Payer: Medicaid Other | Admitting: Diagnostic Neuroimaging

## 2023-08-12 DIAGNOSIS — Z0289 Encounter for other administrative examinations: Secondary | ICD-10-CM

## 2023-08-12 DIAGNOSIS — M5416 Radiculopathy, lumbar region: Secondary | ICD-10-CM

## 2023-08-12 DIAGNOSIS — M79604 Pain in right leg: Secondary | ICD-10-CM

## 2023-08-12 DIAGNOSIS — M79605 Pain in left leg: Secondary | ICD-10-CM

## 2023-08-24 ENCOUNTER — Encounter: Payer: Self-pay | Admitting: Gastroenterology

## 2023-08-24 ENCOUNTER — Ambulatory Visit: Payer: Medicaid Other | Admitting: Gastroenterology

## 2023-08-24 ENCOUNTER — Ambulatory Visit (INDEPENDENT_AMBULATORY_CARE_PROVIDER_SITE_OTHER): Payer: Medicaid Other | Admitting: Gastroenterology

## 2023-08-24 ENCOUNTER — Other Ambulatory Visit: Payer: Self-pay | Admitting: *Deleted

## 2023-08-24 VITALS — BP 133/79 | HR 89 | Temp 97.8°F | Ht 67.0 in | Wt 256.0 lb

## 2023-08-24 DIAGNOSIS — R32 Unspecified urinary incontinence: Secondary | ICD-10-CM

## 2023-08-24 DIAGNOSIS — K219 Gastro-esophageal reflux disease without esophagitis: Secondary | ICD-10-CM | POA: Diagnosis not present

## 2023-08-24 DIAGNOSIS — K649 Unspecified hemorrhoids: Secondary | ICD-10-CM | POA: Diagnosis not present

## 2023-08-24 DIAGNOSIS — R1030 Lower abdominal pain, unspecified: Secondary | ICD-10-CM

## 2023-08-24 DIAGNOSIS — N393 Stress incontinence (female) (male): Secondary | ICD-10-CM

## 2023-08-24 DIAGNOSIS — G8929 Other chronic pain: Secondary | ICD-10-CM

## 2023-08-24 DIAGNOSIS — K6289 Other specified diseases of anus and rectum: Secondary | ICD-10-CM

## 2023-08-24 DIAGNOSIS — K641 Second degree hemorrhoids: Secondary | ICD-10-CM

## 2023-08-24 MED ORDER — NYSTATIN 100000 UNIT/GM EX OINT: 1.0000 | TOPICAL_OINTMENT | Freq: Two times a day (BID) | CUTANEOUS | 0 refills | Status: DC

## 2023-08-24 MED ORDER — SUCRALFATE 1 G PO TABS: 1.0000 g | ORAL_TABLET | Freq: Three times a day (TID) | ORAL | 0 refills | Status: AC | PRN

## 2023-08-24 MED ORDER — LIDOCAINE 5 % EX OINT: 1.0000 | TOPICAL_OINTMENT | CUTANEOUS | 0 refills | Status: AC | PRN

## 2023-08-24 MED ORDER — DEXLANSOPRAZOLE 60 MG PO CPDR: 60.0000 mg | DELAYED_RELEASE_CAPSULE | Freq: Every day | ORAL | 3 refills | Status: DC

## 2023-08-24 MED ORDER — KLAYESTA 100000 UNIT/GM EX POWD: 1.0000 | Freq: Every day | CUTANEOUS | 1 refills | Status: DC | PRN

## 2023-08-24 MED ORDER — NA SULFATE-K SULFATE-MG SULF 17.5-3.13-1.6 GM/177ML PO SOLN: ORAL | 0 refills | Status: DC

## 2023-08-24 NOTE — Progress Notes (Signed)
GI Office Note    Referring Provider: Ignatius Specking, MD Primary Care Physician:  Ignatius Specking, MD Primary Gastroenterologist: Dolores Frame, MD  Date:  08/24/2023  ID:  Kimberly Ortega, DOB Nov 14, 1965, MRN [id number]  Chief Complaint   Chief Complaint  Patient presents with   Follow-up    Follow up. Has a sore spot on butt.   History of Present Illness  Kimberly Ortega is a 57 y.o. female with a history of GERD, gastroparesis, left-sided abdominal pain, recurrent UTIs, anxiety, OSA, PCOS, and HTN presenting today for follow-up with complaint of a sore spot on her perineum.   EGD July 2010: -Procedure report unavailable -Biopsy negative for H. pylori.    EGD July 2018: -Normal esophagus s/p biopsy -Large amount of food residue in the stomach with patent pylorus suggestive of gastroparesis -Gastritis s/p biopsy -Normal duodenum -Advised metoclopramide 10 mg 3 times daily before meals   Colonoscopy in December 2021 with [facility]: Report unavailable in Care Everywhere.  Dr. Dareen Piano.    Pelvic ultrasound 12/10/2022: -Study limited particularly in the endovaginal imaging, however felt as though IUD is in the endometrial canal -X-ray of the pelvis to confirm presence of IUD and pelvis. -Left ovary not visualized -Limited evaluation of right ovary and uterus is otherwise unremarkable   Last office visit 12/31/2022.  Patient reported pain in the epigastric region in the evenings as well as occasional pain in other different areas of her lower abdomen.  She states she feels like there are bumps or sores in her throat.  Has tried Protonix, omeprazole, Nexium, lansoprazole, and Aciphex.  Seen by ENT who told her EGD should be done to screen for cancer (Barrett's).  Reportedly with Lap-Band about 10 years prior and developed subsequent erosion.  Takes Pepcid nightly and Dexilant daily in the mornings.  Reportedly need for back surgery but has gained weight from being inactive.  Used to  walk multiple miles per day.  Chest pains likely secondary to panic disorder.  At times pain is described as 9/10.  Gas-X helps with gas pain.  Denied any change in bowel habits.  Reportedly had hernia repair at time of Lap-Band surgery.  Reportedly waiting to start back Ozempic.  She is scheduled for an upper endoscopy and CT A/P performed given abdominal pain.  Advised to resume Dexilant if Voquezna not helpful given Voquezna samples were provided.    CT A/P 01/07/2023: -No hepatic masses -Prior cholecystectomy -No biliary obstruction or dilation -Normal pancreas and spleen -No bowel obstruction, inflammatory process, or abnormal fluid collection -IUD in appropriate position.   EGD 02/02/2023: -1 cm hiatal hernia -Normal stomach s/p biopsy -Normal duodenum s/p biopsy -Repeat EGD in 3 years for surveillance -Path: Moderate chronic inactive gastritis, negative for H. pylori -Patient advised to continue Dexilant  OV  03/02/23.  Reflux reportedly not well-controlled.  Dexilant was only thing that was working for her.  The cholestanol was also not helpful.  Reflux wakes her up at night and can make her coughing for which she is choking.  Does take Zofran to help with migraines.  Continues to avoid spicy foods but does occasionally eat fried foods.  Was on Ozempic prior to her back surgery.  Was out of her Dexilant and Pepcid at the time.  She was told she had hemorrhoids and was undergoing treatment for a yeast infection.  She was noting pressure and itching with her hemorrhoids but no overt bleeding.  She wanted to discuss tooth for her  reflux.  She was determined to not be a candidate given her BMI.  She was given a course of Carafate and Dexilant symptoms.  Has underwent hemorrhoid banding x3.   Last office visit 04/15/23.  Used to have relief with gas pains and abdominal pain with Gas-X.  She felt like her pains could have been secondary to increased weight gain as she did not folic it was gas.   She reported the pain was similar to when she was having erosions and issues with her Lap-Band previously.  She was recently getting ready to undergo back surgery revision.  Denied any bleeding or itching with hemorrhoid since banding.  Going to have small perianal growth removed by dermatology.  Does not feel as though Dexilant is super effective but it works better than anything else she has tried in the past.  Did note to being under lots of stress and continues to use Zofran as needed for nausea.  She continued to have intermittent coughing and changes in her voice.  She is advised continue Dexilant.  TIF pamphlet provided to patient and Carafate given as needed  Today:  Has been doing terrible since her back revision. Going through rough time currently. She feels like she is having a small pain like a blister feeling pain on her right rectum area. Hard to tell if it is internal vs external.   No real issues with constipation recently. Starting with ozempic and then will transition to [facility]. Some difficulties with constipation. Has had some darker stools recently and big hershey kisses. Edmon Crape was not hard but a little mushy. Has not had black stool recently but did see it for about a week or so. Does still have menstrual cycles. Has been avoiding NSAIDs. Does a prebiotic and probiotic by Cathie Hoops. She has non fat plain yogurt and still has lots of gas and may put frozen strawberries in it. May eat [employer] soup - will eat apples and peanut butter. Eats walnuts and pistachios. Tries to avoid fried stuff. Does not drink soda or tea. Drinks a lot of water. On the normal she is not constipated.   Has been having some stress incotinence as well.    Wt Readings from Last 3 Encounters:  08/24/23 256 lb (116.1 kg)  07/02/23 255 lb (115.7 kg)  04/19/23 250 lb (113.4 kg)   Current Outpatient Medications  Medication Sig Dispense Refill   acetaminophen (TYLENOL) 500 MG tablet Take 1,000 mg by mouth every 6 (six)  hours as needed for moderate pain.     ALPRAZolam (XANAX) 1 MG tablet Take 1 mg by mouth in the morning, at noon, in the evening, and at bedtime.     ascorbic acid (VITAMIN C) 500 MG tablet Take 500 mg by mouth daily.     Biotin w/ Vitamins C & E (HAIR/SKIN/NAILS PO) Take 1 tablet by mouth daily.     botulinum toxin Type A (BOTOX) 200 units injection FOR OFFICE ADMINISTRATION. PHYSICIAN TO INJECT 155 UNITS INTRAMUSCULARLY INTO MUSCLES OF HEAD & NECK EVERY 12 WEEKS.  DISCARD REMAINDER. 1 each 3   busPIRone (BUSPAR) 10 MG tablet Take 10 mg by mouth 2 (two) times daily.     dexlansoprazole (DEXILANT) 60 MG capsule Take 1 capsule (60 mg total) by mouth daily. 90 capsule 3   famotidine (PEPCID) 20 MG tablet Take 1 tablet (20 mg total) by mouth at bedtime as needed for heartburn or indigestion. (Patient taking differently: Take 20 mg by mouth at bedtime.)  KLAYESTA powder Apply 1 Application topically daily as needed (yeast).     levonorgestrel (MIRENA) 20 MCG/DAY IUD 1 each by Intrauterine route once.     MULTIPLE VITAMIN PO Take 1 tablet by mouth daily.     NON FORMULARY Pt uses a cpap nighlty     ondansetron (ZOFRAN) 4 MG tablet Take 1 tablet (4 mg total) by mouth every 6 (six) hours as needed for nausea or vomiting. 20 tablet 0   ondansetron (ZOFRAN-ODT) 4 MG disintegrating tablet DISSOLVE 1 TABLET(4 MG) ON THE TONGUE EVERY 8 HOURS AS NEEDED FOR NAUSEA OR VOMITING 20 tablet 1   OZEMPIC, 1 MG/DOSE, 4 MG/3ML SOPN Inject 1 mg into the skin once a week. Patient not currently not taking     Probiotic Product (ALIGN) CHEW Chew 1 capsule by mouth daily.     Propylene Glycol (SYSTANE COMPLETE) 0.6 % SOLN Place 1 drop into both eyes as needed (dry eyes).     sucralfate (CARAFATE) 1 g tablet Take 1 tablet (1 g total) by mouth 3 (three) times daily as needed (stomach issues). 360 tablet 0   sucralfate (CARAFATE) 1 GM/10ML suspension Take 10 mLs (1 g total) by mouth 4 (four) times daily -  with meals and at  bedtime. 420 mL 0   Ubrogepant (UBRELVY) 100 MG TABS Take 1 tablet (100 mg total) by mouth as needed. 10 tablet 5   cyclobenzaprine (FLEXERIL) 10 MG tablet Take 1 tablet (10 mg total) by mouth 3 (three) times daily as needed for muscle spasms. 30 tablet 0   ketoconazole (NIZORAL) 2 % cream Apply 1 Application topically 2 (two) times daily as needed for irritation.     metFORMIN (GLUCOPHAGE) 1000 MG tablet Take 1,000 mg by mouth daily as needed (When not on Ozempic). Patient not currently taking (Patient not taking: Reported on 08/24/2023)     MONUROL 3 g PACK Take 3 g by mouth daily as needed (UTI). (Patient not taking: Reported on 08/24/2023)  0   Oxycodone HCl 10 MG TABS Take 1 tablet (10 mg total) by mouth every 8 (eight) hours as needed ((score 7 to 10)). 15 tablet 0   VITAMIN A PO Take 1 tablet by mouth daily. (Patient not taking: Reported on 08/24/2023)     No current facility-administered medications for this visit.    Past Medical History:  Diagnosis Date   Abdominal pain    Allergies    Anemia    Anxiety    Arthritis    Cancer (HCC)    skin   Complication of anesthesia 1991   dizziness; almost passed out, post op   Depression    Dyspnea    with exertion   Family history of breast cancer    Cousins on both sides of family   Fatty liver    GERD (gastroesophageal reflux disease) 03/01/2017   History of hiatal hernia    History of melanoma    History of recurrent UTIs    Migraine    last botox injection 04-13-23   Multiple nevi    Obstructive sleep apnea    Panic disorder 1995   Polycystic ovarian disease     Past Surgical History:  Procedure Laterality Date   BIOPSY  04/09/2017   Procedure: BIOPSY;  Surgeon: Malissa Hippo, MD;  [facility];  Service: Endoscopy;;  gastric esophageal    BIOPSY  02/02/2023   Procedure: BIOPSY;  Surgeon: Dolores Frame, MD;  [facility]: [facility];  Service: Gastroenterology;;   CHOLECYSTECTOMY  1991    COLONOSCOPY  2016   DILATION AND CURETTAGE OF UTERUS     ESOPHAGOGASTRODUODENOSCOPY N/A 04/09/2017   Procedure: ESOPHAGOGASTRODUODENOSCOPY (EGD);  Surgeon: Malissa Hippo, MD;  [facility];  Service: Endoscopy;  Laterality: N/A;  9:25   ESOPHAGOGASTRODUODENOSCOPY (EGD) WITH PROPOFOL N/A 02/02/2023   Procedure: ESOPHAGOGASTRODUODENOSCOPY (EGD) WITH PROPOFOL;  Surgeon: Dolores Frame, MD;  [facility]: [facility];  Service: Gastroenterology;  Laterality: N/A;  2:30 pm, moved to 5/14 per Tanya   EYE SURGERY Left 10/23/2021   LAPAROSCOPIC GASTRIC BANDING  07/07/2011   banding removed but has two clamps   LAPAROSCOPIC REPAIR AND REMOVAL OF GASTRIC BAND     LUMBAR LAMINECTOMY/ DECOMPRESSION WITH MET-RX Right 02/09/2023   Procedure: Right Lumbar four-five Minimally Invasive Laminectomy, Discectomy;  Surgeon: Jadene Pierini, MD;  Location: [facility];  Service: Neurosurgery;  Laterality: Right;   LUMBAR LAMINECTOMY/ DECOMPRESSION WITH MET-RX N/A 04/19/2023   Procedure: REVISION LUMBAR FOUR-LUMBAR FIVE MICRODISCECTOMY;  Surgeon: Jadene Pierini, MD;  Location: [facility];  Service: Neurosurgery;  Laterality: N/A;  3C   UPPER GI ENDOSCOPY N/A 11/21/2012   Procedure: UPPER GI ENDOSCOPY [name] DIAGNOSTIC LAPAROSCOPY /REMOVAL LAP GASTRIC BAND/INSERTION OF DRAIN;  Surgeon: Valarie Merino, MD;  Location: [facility];  Service: General;  Laterality: N/A;    Family History  Problem Relation Age of Onset   Cervical cancer Mother    Stroke Mother    Aneurysm Mother    Cervical cancer Sister    Other Paternal Grandmother        had part of colon removed   Breast cancer Cousin        Both sides of family   Cancer - Colon Paternal Uncle     Allergies as of 08/24/2023 - Review Complete 08/24/2023  Allergen Reaction Noted   Ibuprofen Shortness Of Breath and Other (See Comments) 04/06/2017   Macrobid [nitrofurantoin macrocrystal] Shortness Of Breath 03/01/2017   Baclofen Other (See  Comments) 05/13/2022   Morphine and codeine Nausea Only 11/21/2012   Topiramate Other (See Comments) 03/21/2020   Tramadol  03/11/2016   Latex Rash 02/09/2023   Other Palpitations 03/13/2020   Pseudoephedrine Palpitations 01/26/2023    Social History   Socioeconomic History   Marital status: Married    Spouse name: Not on file   Number of children: 1   Years of education: Not on file   Highest education level: Some college, no degree  Occupational History    Comment: na  Tobacco Use   Smoking status: Never   Smokeless tobacco: Never  Vaping Use   Vaping status: Never Used  Substance and Sexual Activity   Alcohol use: Not Currently    Comment: wine/liquor maybe once a month   Drug use: No   Sexual activity: Yes    Birth control/protection: I.U.D.    Comment: mirena  Other Topics Concern   Not on file  Social History Narrative   Lives with spouse   Caffeine- none   Social Determinants of Health   Financial Resource Strain: Not on file  Food Insecurity: Not on file  Transportation Needs: Not on file  Physical Activity: Not on file  Stress: Not on file  Social Connections: Unknown (02/02/2022)   Received from [employer] [facility], [facility]   Social Network    Social Network: Not on file     Review of Systems   Gen: Denies fever, chills, anorexia. Denies fatigue, weakness,  weight loss.  CV: Denies chest pain, palpitations, syncope, peripheral edema, and claudication. Resp: Denies dyspnea at rest, cough, wheezing, coughing up blood, and pleurisy. GI: See HPI Derm: Denies rash, itching, dry skin Psych: Denies depression, anxiety, memory loss, confusion. No homicidal or suicidal ideation.  Heme: Denies bruising, bleeding, and enlarged lymph nodes.  Physical Exam   BP 133/79 (BP Location: Right Arm, Patient Position: Sitting, Cuff Size: Large)   Pulse 89   Temp 97.8 F (36.6 C) (Temporal)   Ht 5\' 7"  (1.702 m)   Wt 256 lb (116.1 kg)   LMP 08/03/2023  (Approximate)   BMI 40.10 kg/m   General:   Alert and oriented. No distress noted. Pleasant and cooperative.  Head:  Normocephalic and atraumatic. Eyes:  Conjuctiva clear without scleral icterus. Mouth:  Oral mucosa pink and moist. Good dentition. No lesions. Abdomen:  +BS, soft, non-tender and non-distended. No rebound or guarding. No HSM or masses noted. Rectal: good tone. No palpable hemorrhoids noted on exam today.  No evidence of anal fissure.  No evidence of rectal mass.  Evidence of mild erythema noted to perineum, improved from prior but remains. Msk:  Symmetrical without gross deformities. Normal posture. Extremities:  Without edema. Neurologic:  Alert and  oriented x4 Psych:  Alert and cooperative. Normal mood and affect.  Assessment  Kimberly Ortega is a 57 y.o. female with a history of GERD, gastroparesis, left-sided abdominal pain, recurrent UTIs, anxiety, OSA, PCOS, and HTN presenting today for follow-up with complaint of a sore spot on her perineum.   GERD, gastroparesis: Maintained on Dexilant for reflux.  Still has some chronic intermittent symptoms even despite not eating as much given her anxiety and depression given her life stress currently.  EGD earlier this year with mild chronic inactive gastritis and pathology negative for H. pylori.  Has tried and failed multiple other PPIs in the past and Dexilant gives her the best relief.  Continues to use Carafate as needed.  Is continuing to work toward weight loss to be able to go through workup for TIPS procedure.  She does have evidence of a very small hiatal hernia which could be contributing somewhat to her symptoms especially with recent weight gain.  For now we will continue with current regimen.  She has also been taking Pepcid nightly which is okay for her to continue.  Abdominal pain - lower: Continues to have chronic left lower quadrant abdominal pain intermittently.  Unclear if this is related to bowel habits as pain can  always be there and does not always occur with bowel movements.  She does continue to have chronic back pain despite recent surgical revision in July.  For now advised her to continue probiotic and continue to work toward weight loss.  Although she states she has a bowel movement almost every day it is usually small amounts given her lower p.o. intake recently.  She feels like she would feel better if she had a cleanout and she requested bowel prep today.  We discussed traditional bowel prep with GoLytely or a MiraLAX prep but she would prefer small-volume prep given her history.  Kidney function is stable therefore sent in Suprep given this is what is covered by her insurance.   Hemorrhoids, rectal discomfort: Has underwent hemorrhoid banding x 3.  Does not have any residual bleeding or itching.  She has had some mild soreness to the left lateral region internally.  No significant anal fissure or hemorrhoids noted on exam today I have  recommended some rectal lidocaine to use as needed and have sent this in for her today.   Stress incontinence: Has ben having some incontinence with coughing and sneezing. Having some perineal irritation. Will refill nystatin for now and refer to urology to discuss treatment options for incontinence.   PLAN   Continue Dexilant 60 mg once daily Continue Carafate as needed.  Pepcid nightly Continue probiotic -can switch to over-the-counter align, Vear Clock' colon health, or Culturelle GERD diet Weight loss Suprep bowel prep per patient request Urology referral.  Refilled nystatin powder and advised for her to start with the cream and if no improvement to use the powder.  Continue good hygiene to perineum. Follow up in 3 months    [name], MSN, FNP-BC, AGACNP-BC [facility]  I have reviewed the note and agree with the APP's assessment as described in this progress note  May want to start concomitantly MiraLAX after taking bowel  prep.  This will be discussed with the patient.  Katrinka Blazing, MD Gastroenterology and Hepatology [facility]

## 2023-08-24 NOTE — Patient Instructions (Addendum)
For rectum and perineum: I have sent in rectal lidocaine for you to see if insurance will cover. You can apply this to th rectum. I have resent in the nystatin powder for you to use as needed if the ointment does not work.  You can apply the ointment twice daily.  Same goes for the powder if you end up using this.  For GERD: I have sent in a refill on her Dexilant as well as Carafate.  We will send in urology referral for you to discuss your urinary incontinence.  I have sent in a bowel prep for you to the pharmacy.  Hopefully this will help with the gassiness.  I do recommend you continue a probiotic.  You can try align, Vear Clock' colon health, or Culturelle.  Follow-up in 3 months.  It was a pleasure to see you today. I want to create trusting relationships with patients. If you receive a survey regarding your visit,  I greatly appreciate you taking time to fill this out on paper or through your MyChart. I value your feedback.  [name], MSN, FNP-BC, AGACNP-BC [facility]

## 2023-08-25 NOTE — Procedures (Signed)
[facility]  NCS (NERVE CONDUCTION STUDY) WITH EMG (ELECTROMYOGRAPHY) REPORT   STUDY DATE: 08/12/23 PATIENT NAME: Kimberly Ortega DOB: 28-Apr-1966 MRN: [id number]  ORDERING CLINICIAN: Elizbeth Squires, [facility]  TECHNOLOGIST: Jenelle Mages ELECTROMYOGRAPHER: Glenford Bayley. Miranda Garber, MD  CLINICAL INFORMATION: 57 year old female status post right L4-5 microdiscectomy, with ongoing right lower extremity pain and new onset of left lower extremity pain.  FINDINGS: NERVE CONDUCTION STUDY:  Right peroneal motor responses normal distal latency, decreased amplitude, normal conduction velocity (right foot and ankle swelling noted).  Right tibial motor response has slightly prolonged distal latency, normal amplitude and normal conduction velocity.  Left peroneal and tibial motor responses are normal.  Bilateral tibial F wave latencies are normal.  Bilateral sural and superficial peroneal sensory responses are normal.   NEEDLE ELECTROMYOGRAPHY:  Needle examination of bilateral lower extremities is normal.    IMPRESSION:   The study demonstrates: - Mild abnormalities of right peroneal and right tibial motor responses, could be related to technical factors of swallowing in the right lower extremity or mild underlying right L5-S1 radiculopathies.  Normal sensory nerve responses.  Needle examination does not further localize. - No definite underlying widespread polyneuropathy.   INTERPRETING PHYSICIAN:  Suanne Marker, MD Certified in Neurology, Neurophysiology and Neuroimaging  [facility] [address] [place], [place] [address] [phone]  [facility]    Nerve / Sites Muscle Latency Ref. Amplitude Ref. Rel Amp Segments Distance Velocity Ref. Area    ms ms mV mV %  cm m/s m/s mVms  R Peroneal - EDB     Ankle EDB 6.0 <=6.5 1.5 >=2.0 100 Ankle - EDB 9   4.4     Fib head EDB 12.0  1.1  78.8 Fib head - Ankle 29 49 >=44 5.0     Pop fossa EDB 13.9   1.3  114 Pop fossa - Fib head 9 46 >=44 4.9         Pop fossa - Ankle      L Peroneal - EDB     Ankle EDB 5.4 <=6.5 2.8 >=2.0 100 Ankle - EDB 9   9.7     Fib head EDB 11.6  2.5  90.6 Fib head - Ankle 27 44 >=44 8.2     Pop fossa EDB 13.4  3.3  129 Pop fossa - Fib head 10 57 >=44 10.9         Pop fossa - Ankle      R Tibial - AH     Ankle AH 6.2 <=5.8 12.0 >=4.0 100 Ankle - AH 9   24.6     Pop fossa AH 14.6  7.7  64.4 Pop fossa - Ankle 35 41 >=41 17.1  L Tibial - AH     Ankle AH 5.7 <=5.8 12.4 >=4.0 100 Ankle - AH 9   24.2     Pop fossa AH 13.1  9.9  79.8 Pop fossa - Ankle 36 49 >=41 24.9             SNC    Nerve / Sites Rec. Site Peak Lat Ref.  Amp Ref. Segments Distance    ms ms V V  cm  R Sural - Ankle (Calf)     Calf Ankle 3.6 <=4.4 8 >=6 Calf - Ankle 11  L Sural - Ankle (Calf)     Calf Ankle 2.8 <=4.4 15 >=6 Calf - Ankle 10  R Superficial peroneal - Ankle     Lat  leg Ankle 3.1 <=4.4 12 >=6 Lat leg - Ankle 14  L Superficial peroneal - Ankle     Lat leg Ankle 3.5 <=4.4 7 >=6 Lat leg - Ankle 14             F  Wave    Nerve F Lat Ref.   ms ms  R Tibial - AH 50.1 <=56.0  L Tibial - AH 50.9 <=56.0         EMG Summary Table    Spontaneous MUAP Recruitment  Muscle IA Fib PSW Fasc Other Amp Dur. Poly Pattern  R. Vastus medialis Normal None None None _______ Normal Normal Normal Normal  R. Tibialis anterior Normal None None None _______ Normal Normal Normal Normal  R. Gastrocnemius (Medial head) Normal None None None _______ Normal Normal Normal Normal  L. Vastus medialis Normal None None None _______ Normal Normal Normal Normal  L. Tibialis anterior Normal None None None _______ Normal Normal Normal Normal  L. Gastrocnemius (Medial head) Normal None None None _______ Normal Normal Normal Normal

## 2023-08-26 ENCOUNTER — Other Ambulatory Visit: Payer: Self-pay | Admitting: Gastroenterology

## 2023-08-26 ENCOUNTER — Encounter: Payer: Medicaid Other | Admitting: Diagnostic Neuroimaging

## 2023-08-26 MED ORDER — POLYETHYLENE GLYCOL 3350 17 GM/SCOOP PO POWD: ORAL | 0 refills | Status: DC

## 2023-08-26 MED ORDER — POLYETHYLENE GLYCOL 3350 17 GM/SCOOP PO POWD: ORAL | 2 refills | Status: DC

## 2023-09-06 ENCOUNTER — Ambulatory Visit (INDEPENDENT_AMBULATORY_CARE_PROVIDER_SITE_OTHER): Payer: Medicaid Other | Admitting: Clinical

## 2023-09-06 DIAGNOSIS — F419 Anxiety disorder, unspecified: Secondary | ICD-10-CM

## 2023-09-06 DIAGNOSIS — F331 Major depressive disorder, recurrent, moderate: Secondary | ICD-10-CM

## 2023-09-06 DIAGNOSIS — F410 Panic disorder [episodic paroxysmal anxiety] without agoraphobia: Secondary | ICD-10-CM

## 2023-09-06 NOTE — Progress Notes (Signed)
Virtual Visit via Video Note   I connected with Kimberly Ortega on 09/06/23 at 4:00 PM EST by a video enabled telemedicine application and verified that I am speaking with the correct person using two identifiers.   Location: Patient: Home Provider: Office   I discussed the limitations of evaluation and management by telemedicine and the availability of in person appointments. The patient expressed understanding and agreed to proceed.   THERAPIST PROGRESS NOTE     Session Time: 4:00 PM-4:30 PM   Participation Level: Active   Behavioral Response: Casual and Alert,Depressed   Type of Therapy: Individual Therapy   Treatment Goals addressed: Depression and Anxiety   Interventions: CBT   Summary: Kimberly Ortega is a 57 y.o. female who presents with Depression with Anxiety and Panic Disorder. The OPT therapist worked with the patient for her ongoing OPT treatment. The OPT therapist utilized Motivational Interviewing to assist in creating therapeutic repore. The patient in the session was engaged and work in collaboration giving feedback about her triggers and symptoms over the past few days. The patient spoke about follow up from her MRI and nerve study and noted the doctor indicated there was nothing further to be done but refer her to pain management. The OPT therapist utilized Cognitive Behavioral Therapy through cognitive restructuring as well as worked with the patient on coping strategies to assist in management of mood. The OPT therapist overviewed in session with the patient basic need areas examining the patients current eating habits, sleep schedule, exercise, and hygiene. The OPT therapist overviewed with the patient since the last few weeks  and post the separation her living situation . The patient notes she continues to struggle with her anxiety, however, her physicians noted to the patient that until her life situation/circumstances her difficulty with anxiety wont change and they will  not change her medication. The patient is currently in the process of getting advice around her legal rights as she moves forward in her separation/divorce from her ex-husband.   Suicidal/Homicidal: Nowithout intent/plan   Therapist Response:The OPT therapist worked with the patient for the patients scheduled session. The patient was engaged in her session and gave feedback in relation to triggers, symptoms, and behavior responses over the past few weeks. The OPT therapist worked with the patient utilizing an in session Cognitive Behavioral Therapy exercise. The patient was responsive in the session and verbalized, " I met with the doctor for my follow up and he didn't even talk about my nerve study and all he said was he didn't recommend another surgery and he would refer me to pain management". The patient spoke about feeling she is getting mixed messages from her health providers with a PA indicating she needs another surgery and the attending indicating she does not need another surgery and wanting to refer her to pain management. The OPT therapist worked with the patient to challenge her negative thoughts and empower her to continue to work with her health providers in working her health problems and getting to desired outcomes. The patient verbalized she is not interested in doing pain management / epidural shot. The OPT therapist worked with the patient on importance of utilizing coping skills and consistency with the recommendations ongoing of her physical health providers.The OPT therapist reviewed the patients current support network .The OPT therapist overviewed with the patient should she go into crisis to report to nearest hospital for inpatient evaluation and treatment services. The OPT therapist overviewed through in session exercise the in my control  vs not in my control exercise.The patient continues to work to get representation and legal advice in relation to her current situation  (separation/divorce).The OPT therapist will continue treatment work with the patient in her next scheduled session.   Plan: Return again in 2/3 weeks.   Diagnosis:      Axis I: Recurrent Moderate Major Depressive Disorder with Anxiety. Panic Disorder    Axis II: No diagnosis      Collaboration of Care: No additional collaboration of care for this session.   Patient/Guardian was advised Release of Information must be obtained prior to any record release in order to collaborate their care with an outside provider. Patient/Guardian was advised if they have not already done so to contact the registration department to sign all necessary forms in order for [name] to release information regarding their care.    Consent: Patient/Guardian gives verbal consent for treatment and assignment of benefits for services provided during this visit. Patient/Guardian expressed understanding and agreed to proceed.    I discussed the assessment and treatment plan with the patient. The patient was provided an opportunity to ask questions and all were answered. The patient agreed with the plan and demonstrated an understanding of the instructions.   The patient was advised to call back or seek an in-person evaluation if the symptoms worsen or if the condition fails to improve as anticipated.   I provided 30 minutes of non-face-to-face time during this encounter.   [name], LCSW   09/06/2023

## 2023-09-13 ENCOUNTER — Ambulatory Visit: Payer: Medicaid Other | Attending: Internal Medicine | Admitting: Internal Medicine

## 2023-09-13 ENCOUNTER — Encounter: Payer: Self-pay | Admitting: Internal Medicine

## 2023-09-13 VITALS — BP 116/74 | HR 86 | Ht 67.0 in | Wt 257.0 lb

## 2023-09-13 DIAGNOSIS — R079 Chest pain, unspecified: Secondary | ICD-10-CM

## 2023-09-13 DIAGNOSIS — R0602 Shortness of breath: Secondary | ICD-10-CM | POA: Diagnosis not present

## 2023-09-13 MED ORDER — METOPROLOL TARTRATE 100 MG PO TABS: 100.0000 mg | ORAL_TABLET | Freq: Once | ORAL | 0 refills | Status: DC

## 2023-09-13 NOTE — Patient Instructions (Addendum)
Medication Instructions:  Your physician recommends that you continue on your current medications as directed. Please refer to the Current Medication list given to you today.  *If you need a refill on your cardiac medications before your next appointment, please call your pharmacy*  Lab Work: At your convenience at any [facility] office: Lipomed, Hgb A1c If you have labs (blood work) drawn today and your tests are completely normal, you will receive your results only by: MyChart Message (if you have MyChart) OR A paper copy in the mail If you have any lab test that is abnormal or we need to change your treatment, we will call you to review the results.  Testing/Procedures: Your physician has requested that you have cardiac CT. Cardiac computed tomography (CT) is a painless test that uses an x-ray machine to take clear, detailed pictures of your heart. For further information please visit [email or web address] Please follow instruction sheet as given.  Your physician has requested that you have an echocardiogram. Echocardiography is a painless test that uses sound waves to create images of your heart. It provides your doctor with information about the size and shape of your heart and how well your heart's chambers and valves are working. This procedure takes approximately one hour. There are no restrictions for this procedure. Please do NOT wear cologne, perfume, aftershave, or lotions (deodorant is allowed). Please arrive 15 minutes prior to your appointment time.  Please note: We ask at that you not bring children with you during ultrasound (echo/ vascular) testing. Due to room size and safety concerns, children are not allowed in the ultrasound rooms during exams. Our front office staff cannot provide observation of children in our lobby area while testing is being conducted. An adult accompanying a patient to their appointment will only be allowed in the ultrasound room at the discretion of the  ultrasound technician under special circumstances. We apologize for any inconvenience.  Follow-Up: At [facility], you and your health needs are our priority.  As part of our continuing mission to provide you with exceptional heart care, we have created designated Provider Care Teams.  These Care Teams include your primary Cardiologist (physician) and Advanced Practice Providers (APPs -  Physician Assistants and Nurse Practitioners) who all work together to provide you with the care you need, when you need it.  We recommend signing up for the patient portal called "MyChart".  Sign up information is provided on this After Visit Summary.  MyChart is used to connect with patients for Virtual Visits (Telemedicine).  Patients are able to view lab/test results, encounter notes, upcoming appointments, etc.  Non-urgent messages can be sent to your provider as well.   To learn more about what you can do with MyChart, go to [email or web address].    Your next appointment:   Pending results of CT scan and echocardiogram  The format for your next appointment:   In Person  Provider:   Dietrich Pates, MD {  Other Instructions   Your cardiac CT will be scheduled at:   [facility] [address] [place], [place] [address] [phone]  Please arrive at the [facility] and Children's Entrance (Entrance C2) of [facility] 30 minutes prior to test start time. You can use the FREE valet parking offered at entrance C (encouraged to control the heart rate for the test). Proceed to the [facility] Radiology Department (first floor) to check-in and test prep.  All radiology patients and guests should use entrance C2 at [facility], accessed from [facility], even though the hospital's physical address listed is [address].    Please follow these instructions carefully (unless otherwise directed):  An IV will be required for this test and Nitroglycerin will  be given.  Hold all erectile dysfunction medications at least 3 days (72 hrs) prior to test. (Ie viagra, cialis, sildenafil, tadalafil, etc)   On the Night Before the Test: Be sure to Drink plenty of water. Do not consume any caffeinated/decaffeinated beverages or chocolate 12 hours prior to your test. Do not take any antihistamines 12 hours prior to your test.  On the Day of the Test: Drink plenty of water until 1 hour prior to the test. Do not eat any food 1 hour prior to test. You may take your regular medications prior to the test.  Take metoprolol (Lopressor) 100 mg two hours prior to test. If you take Furosemide/Hydrochlorothiazide/Spironolactone/Chlorthalidone, please HOLD on the morning of the test. Patients who wear a continuous glucose monitor MUST remove the device prior to scanning. FEMALES- please wear underwire-free bra if available, avoid dresses & tight clothing       After the Test: Drink plenty of water. After receiving IV contrast, you may experience a mild flushed feeling. This is normal. On occasion, you may experience a mild rash up to 24 hours after the test. This is not dangerous. If this occurs, you can take Benadryl 25 mg and increase your fluid intake. If you experience trouble breathing, this can be serious. If it is severe call 911 IMMEDIATELY. If it is mild, please call our office.  We will call to schedule your test 2-4 weeks out understanding that some insurance companies will need an authorization prior to the service being performed.   For more information and frequently asked questions, please visit our website : [email or web address]  For non-scheduling related questions, please contact the cardiac imaging nurse navigator should you have any questions/concerns: Cardiac Imaging Nurse Navigators Direct Office Dial: [phone]   For scheduling needs, including cancellations and rescheduling, please call [place], [phone].

## 2023-09-13 NOTE — Progress Notes (Signed)
Cardiology Office Note   Date:  09/13/2023   ID:  Kimberly Ortega, DOB [birth year], MRN [id number]  PCP:  Ignatius Specking, MD  Cardiologist:   Dietrich Pates, MD    Pt presents for evaluation of CP    History of Present Illness: Kimberly Ortega is a 57 y.o. female with a history of  asthma, PCOS, sleep apnea, GERD, panic and CP      2017 Pt seen in [place] [facility] for CP   Labs negative   Echo normal She was seen by Ival Bible in June 2017 for CP    Stress echo at the time was normal     She also  went to pulmonary doctor   Normal PFTs   The pt hs had a long hx of chest pains  Some sharp, shooting   Some pressure  Will take GasX  Works sometimes   Also has L and R arm pains   Longlasting    PT compains of SB just with minimal activity    Also notes a lot of ankle swelling  She was seen in ER on 07/02/23 for sharp CP  CP lasted hours   Sent home   Gets a lot of ankle swelling    Pt has been on CPAP since May 2024     Still recovering form 2 back surgeries  May and July 2024     Current Meds  Medication Sig   ALPRAZolam (XANAX) 1 MG tablet Take 1 mg by mouth in the morning, at noon, in the evening, and at bedtime.   Biotin w/ Vitamins C & E (HAIR/SKIN/NAILS PO) Take 1 tablet by mouth daily.   botulinum toxin Type A (BOTOX) 200 units injection FOR OFFICE ADMINISTRATION. PHYSICIAN TO INJECT 155 UNITS INTRAMUSCULARLY INTO MUSCLES OF HEAD & NECK EVERY 12 WEEKS.  DISCARD REMAINDER.   busPIRone (BUSPAR) 10 MG tablet Take 10 mg by mouth 2 (two) times daily.   Calcium Carbonate-Vit D-Min (CALCIUM 1200 PO) Take 3 tablets by mouth daily. With Magnesium Vitamin D and Zinc   dexlansoprazole (DEXILANT) 60 MG capsule Take 1 capsule (60 mg total) by mouth daily.   famotidine (PEPCID) 20 MG tablet Take 1 tablet (20 mg total) by mouth at bedtime as needed for heartburn or indigestion. (Patient taking differently: Take 20 mg by mouth at bedtime.)   KLAYESTA powder Apply 1 Application topically daily as  needed (yeast).   levonorgestrel (MIRENA) 20 MCG/DAY IUD 1 each by Intrauterine route once.   lidocaine (XYLOCAINE) 5 % ointment Apply 1 Application topically as needed.   MULTIPLE VITAMIN PO Take 1 tablet by mouth daily.   Na Sulfate-K Sulfate-Mg Sulf 17.5-3.13-1.6 GM/177ML SOLN Step 1: Pour ONE (1) 6-ounce bottle of SUPREP liquid into the mixing container. Step 2:  Add cool drinking water to the 16 ounce line on the container and mix. Note: Dilute the solution concentrate as directed prior to use. Step 3:  DRINK ALL the liquid in the container. Step 4:You MUST drink an additional two (2) or more 16 ounce containers of water over the next one (1) hour.   NON FORMULARY Pt uses a cpap nighlty   nystatin ointment (MYCOSTATIN) Apply 1 Application topically 2 (two) times daily.   ondansetron (ZOFRAN-ODT) 4 MG disintegrating tablet DISSOLVE 1 TABLET(4 MG) ON THE TONGUE EVERY 8 HOURS AS NEEDED FOR NAUSEA OR VOMITING   OZEMPIC, 1 MG/DOSE, 4 MG/3ML SOPN Inject 1 mg into the skin once a week. Patient not currently  not taking   polyethylene glycol powder (MIRALAX) 17 GM/SCOOP powder Use 17 g (1 capful/scoop) in 8 ounces of water once daily.   Probiotic Product (ALIGN) CHEW Chew 1 capsule by mouth daily.   Propylene Glycol (SYSTANE COMPLETE) 0.6 % SOLN Place 1 drop into both eyes as needed (dry eyes).   sucralfate (CARAFATE) 1 g tablet Take 1 tablet (1 g total) by mouth 3 (three) times daily as needed (stomach issues).   sucralfate (CARAFATE) 1 GM/10ML suspension Take 10 mLs (1 g total) by mouth 4 (four) times daily -  with meals and at bedtime.   Ubrogepant (UBRELVY) 100 MG TABS Take 1 tablet (100 mg total) by mouth as needed.     Allergies:   Ibuprofen, Macrobid [nitrofurantoin macrocrystal], Baclofen, Morphine and codeine, Topiramate, Tramadol, Latex, Other, and Pseudoephedrine   Past Medical History:  Diagnosis Date   Abdominal pain    Allergies    Anemia    Anxiety    Arthritis    Cancer (HCC)     skin   Complication of anesthesia 1991   dizziness; almost passed out, post op   Depression    Dyspnea    with exertion   Family history of breast cancer    Cousins on both sides of family   Fatty liver    GERD (gastroesophageal reflux disease) 03/01/2017   History of hiatal hernia    History of melanoma    History of recurrent UTIs    Migraine    last botox injection 04-13-23   Multiple nevi    Obstructive sleep apnea    Panic disorder 1995   Polycystic ovarian disease     Past Surgical History:  Procedure Laterality Date   BIOPSY  04/09/2017   Procedure: BIOPSY;  Surgeon: Malissa Hippo, MD;  [facility]: [facility];  Service: Endoscopy;;  gastric esophageal    BIOPSY  02/02/2023   Procedure: BIOPSY;  Surgeon: Dolores Frame, MD;  [facility]: [facility];  Service: Gastroenterology;;   CHOLECYSTECTOMY  1991   COLONOSCOPY  2016   DILATION AND CURETTAGE OF UTERUS     ESOPHAGOGASTRODUODENOSCOPY N/A 04/09/2017   Procedure: ESOPHAGOGASTRODUODENOSCOPY (EGD);  Surgeon: Malissa Hippo, MD;  [facility]: [facility];  Service: Endoscopy;  Laterality: N/A;  9:25   ESOPHAGOGASTRODUODENOSCOPY (EGD) WITH PROPOFOL N/A 02/02/2023   Procedure: ESOPHAGOGASTRODUODENOSCOPY (EGD) WITH PROPOFOL;  Surgeon: Dolores Frame, MD;  [facility]: [facility];  Service: Gastroenterology;  Laterality: N/A;  2:30 pm, moved to 5/14 per Tanya   EYE SURGERY Left 10/23/2021   LAPAROSCOPIC GASTRIC BANDING  07/07/2011   banding removed but has two clamps   LAPAROSCOPIC REPAIR AND REMOVAL OF GASTRIC BAND     LUMBAR LAMINECTOMY/ DECOMPRESSION WITH MET-RX Right 02/09/2023   Procedure: Right Lumbar four-five Minimally Invasive Laminectomy, Discectomy;  Surgeon: Jadene Pierini, MD;  Location: [facility];  Service: Neurosurgery;  Laterality: Right;   LUMBAR LAMINECTOMY/ DECOMPRESSION WITH MET-RX N/A 04/19/2023   Procedure: REVISION LUMBAR FOUR-LUMBAR FIVE MICRODISCECTOMY;  Surgeon:  Jadene Pierini, MD;  Location: [facility] OR;  Service: Neurosurgery;  Laterality: N/A;  3C   UPPER GI ENDOSCOPY N/A 11/21/2012   Procedure: UPPER GI ENDOSCOPY [name] DIAGNOSTIC LAPAROSCOPY /REMOVAL LAP GASTRIC BAND/INSERTION OF DRAIN;  Surgeon: Valarie Merino, MD;  Location: [facility];  Service: General;  Laterality: N/A;     Social History:  The patient  reports that she has never smoked. She has never used smokeless tobacco. She reports that she  does not currently use alcohol. She reports that she does not use drugs.   Family History:  The patient's family history includes Aneurysm in her mother; Breast cancer in her cousin; Cancer - Colon in her paternal uncle; Cervical cancer in her mother and sister; Other in her paternal grandmother; Stroke in her mother.    ROS:  Please see the history of present illness. All other systems are reviewed and  Negative to the above problem except as noted.    PHYSICAL EXAM: VS:  BP 116/74   Pulse 86   Ht 5\' 7"  (1.702 m)   Wt 257 lb (116.6 kg)   LMP 08/03/2023 (Approximate)   SpO2 94%   BMI 40.25 kg/m   GEN: Obese 57 yo  in no acute distress  HEENT: normal  Neck: no JVD, carotid bruits Cardiac: RRR; no murmurs  Tr LE edema  Respiratory:  clear to auscultation GI: soft,  Obese   No  hepatomegaly  MS: no deformity Moving all extremities   Skin: warm and dry, no rash Neuro:  Strength and sensation are intact Psych: euthymic mood, full affect   EKG:  EKG is not ordered today.   Lipid Panel No results found for: "CHOL", "TRIG", "HDL", "CHOLHDL", "VLDL", "LDLCALC", "LDLDIRECT"    Wt Readings from Last 3 Encounters:  09/13/23 257 lb (116.6 kg)  08/24/23 256 lb (116.1 kg)  07/02/23 255 lb (115.7 kg)      ASSESSMENT AND PLAN:  1  CP   Pt has had a long history of chest pains   Different types   Not associated with activity   Does note SOB with activity though She does have a hx of anxiety which can exacerbate Also has DOE  Overall  volume status looks OK  Will set up for CCTA   Also set up for echo to eval systolic/diastolic function   2  Metabolics  Will set up for A1C  3   HL   Will check lipomed   Follow up base on test results      Current medicines are reviewed at length with the patient today.  The patient does not have concerns regarding medicines.  Signed, Dietrich Pates, MD  09/13/2023 3:05 PM    [facility] [address] [place], [place], [place]  [address] Phone: [phone]; Fax: [phone]

## 2023-09-14 LAB — NMR, LIPOPROFILE
Cholesterol, Total: 202 mg/dL — ABNORMAL HIGH (ref 100–199)
HDL Particle Number: 26.9 umol/L — ABNORMAL LOW (ref >=30.5)
HDL-C: 41 mg/dL (ref >39)
LDL Particle Number: 1408 nmol/L — ABNORMAL HIGH (ref <1000)
LDL Size: 20.9 nmol (ref >20.5)
LDL-C (NIH Calc): 129 mg/dL — ABNORMAL HIGH (ref 0–99)
LP-IR Score: 65 — ABNORMAL HIGH (ref <=45)
Small LDL Particle Number: 499 nmol/L (ref <=527)
Triglycerides: 178 mg/dL — ABNORMAL HIGH (ref 0–149)

## 2023-09-14 LAB — HEMOGLOBIN A1C
Est. average glucose Bld gHb Est-mCnc: 111 mg/dL
Hgb A1c MFr Bld: 5.5 % (ref 4.8–5.6)

## 2023-09-16 ENCOUNTER — Telehealth: Payer: Self-pay | Admitting: Internal Medicine

## 2023-09-16 NOTE — Telephone Encounter (Signed)
Pt c/o medication issue:  1. Name of Medication: Metoprolol  2. How are you currently taking this medication (dosage and times per day)?   3. Are you having a reaction (difficulty breathing--STAT)?   4. What is your medication issue? Patient wants to know why she needs to take the Metoprolol- she was informed by the pharmacist, thst she needs to take Metoprolol.       Patient also would like for someone to please explain her lab results. She saw them on My-Chart, but did not understand the results

## 2023-09-16 NOTE — Telephone Encounter (Signed)
Returned call to patient to make her aware that the Metoprolol was a one time dose for her CCTA that was ordered. The labs have not been resulted by Dr Tenny Craw yet, but she wanted to stress that she just started ozempic and has PCOS and feels that this should be taken into account in determining the next steps.

## 2023-09-20 ENCOUNTER — Telehealth: Payer: Self-pay | Admitting: Internal Medicine

## 2023-09-20 NOTE — Telephone Encounter (Signed)
Patient is requesting call back to discuss results. Please advise.

## 2023-09-20 NOTE — Telephone Encounter (Signed)
Patient states she receives meals from [employer] as she is not able to cook for herself due to her back.  She is requesting a letter from Dr. Tenny Craw to be sent to Medicaid's portal detailing what dietary changes she needs to follow so that they can provide her will these types of meals.  Asked patient if she had any contact information to send a letter to her insurance, and she was told the provider would submit through the Medicaid portal and she didn't haven any other information.  Will forward to Dr. Tenny Craw' nurse to follow-up on this.

## 2023-09-28 ENCOUNTER — Ambulatory Visit: Payer: Medicaid Other

## 2023-09-29 ENCOUNTER — Encounter: Payer: Self-pay | Admitting: Family Medicine

## 2023-09-30 ENCOUNTER — Ambulatory Visit: Payer: Medicaid Other | Admitting: Urology

## 2023-09-30 DIAGNOSIS — M5116 Intervertebral disc disorders with radiculopathy, lumbar region: Secondary | ICD-10-CM | POA: Insufficient documentation

## 2023-09-30 DIAGNOSIS — M961 Postlaminectomy syndrome, not elsewhere classified: Secondary | ICD-10-CM | POA: Insufficient documentation

## 2023-09-30 DIAGNOSIS — G5701 Lesion of sciatic nerve, right lower limb: Secondary | ICD-10-CM | POA: Insufficient documentation

## 2023-09-30 NOTE — Progress Notes (Deleted)
Name: Kimberly Ortega DOB: 1965-10-30 MRN: [id number]  History of Present Illness: Kimberly Ortega is a 58 y.o. female who presents today as a new patient at [facility] [place]. All available relevant medical records have been reviewed.  ***She is accompanied by ***.  Today: She reports chief complaint of urinary incontinence.  She {Actions; denies-reports:120008} urge incontinence. She {Actions; denies-reports:120008} stress incontinence with ***cough/***laugh/***sneeze/***heavy lifting /***exercise. She reports the ***SUI / ***UUI is predominant.  She leaks *** times per ***. Wears *** ***pads/ ***diapers per day. She reports urinary incontinence {ACTION; IS/IS [id number]:[id number]} significantly bothersome.   She reports urinary ***frequency, ***nocturia, and ***urgency. Voiding ***x/day and ***x/night on average.   She {Actions; denies-reports:120008} prior attempted treatment for these symptoms ***including ***  She {Actions; denies-reports:120008} caffeine intake (*** caffeinated beverages per day on average).  She {Actions; denies-reports:120008} history of obstructive sleep apnea; {Actions; denies-reports:120008} wearing CPAP routinely.  ***She denies ever having a sleep study before. She {Actions; denies-reports:120008} fluid intake within 3 hours prior to bedtime. She {Actions; denies-reports:120008} fluid intake during the night.  She {Actions; denies-reports:120008} caffeine intake within 8 hours prior to bedtime. She {Actions; denies-reports:120008} routinely experiencing lower extremity edema during the day. ***She {Actions; denies-reports:120008} elevating feet during the day and/or wearing compression socks when lower extremity edema is present during the day. ***She {Actions; denies-reports:120008} monitoring dietary salt and sodium intake.  She {Actions; denies-reports:120008} dysuria, gross hematuria, straining to void, or sensations of incomplete emptying.   Fall  Screening: Do you usually have a device to assist in your mobility? {yes/no:20286} ***cane / ***walker / ***wheelchair  Medications: Current Outpatient Medications  Medication Sig Dispense Refill   ALPRAZolam (XANAX) 1 MG tablet Take 1 mg by mouth in the morning, at noon, in the evening, and at bedtime.     Biotin w/ Vitamins C & E (HAIR/SKIN/NAILS PO) Take 1 tablet by mouth daily.     botulinum toxin Type A (BOTOX) 200 units injection FOR OFFICE ADMINISTRATION. PHYSICIAN TO INJECT 155 UNITS INTRAMUSCULARLY INTO MUSCLES OF HEAD & NECK EVERY 12 WEEKS.  DISCARD REMAINDER. 1 each 3   busPIRone (BUSPAR) 10 MG tablet Take 10 mg by mouth 2 (two) times daily.     Calcium Carbonate-Vit D-Min (CALCIUM 1200 PO) Take 3 tablets by mouth daily. With Magnesium Vitamin D and Zinc     dexlansoprazole (DEXILANT) 60 MG capsule Take 1 capsule (60 mg total) by mouth daily. 90 capsule 3   famotidine (PEPCID) 20 MG tablet Take 1 tablet (20 mg total) by mouth at bedtime as needed for heartburn or indigestion. (Patient taking differently: Take 20 mg by mouth at bedtime.)     KLAYESTA powder Apply 1 Application topically daily as needed (yeast). 15 g 1   levonorgestrel (MIRENA) 20 MCG/DAY IUD 1 each by Intrauterine route once.     lidocaine (XYLOCAINE) 5 % ointment Apply 1 Application topically as needed. 35.44 g 0   metoprolol tartrate (LOPRESSOR) 100 MG tablet Take 1 tablet (100 mg total) by mouth once for 1 dose. Take 90-120 minutes prior to scan. Hold for SBP less than 110. 1 tablet 0   MULTIPLE VITAMIN PO Take 1 tablet by mouth daily.     Na Sulfate-K Sulfate-Mg Sulf 17.5-3.13-1.6 GM/177ML SOLN Step 1: Pour ONE (1) 6-ounce bottle of SUPREP liquid into the mixing container. Step 2:  Add cool drinking water to the 16 ounce line on the container and mix. Note: Dilute the solution concentrate as directed prior to use. Step 3:  DRINK ALL the liquid in the container. Step 4:You MUST drink an additional two (2) or more 16  ounce containers of water over the next one (1) hour. 354 mL 0   NON FORMULARY Pt uses a cpap nighlty     nystatin ointment (MYCOSTATIN) Apply 1 Application topically 2 (two) times daily. 30 g 0   ondansetron (ZOFRAN-ODT) 4 MG disintegrating tablet DISSOLVE 1 TABLET(4 MG) ON THE TONGUE EVERY 8 HOURS AS NEEDED FOR NAUSEA OR VOMITING 20 tablet 1   OZEMPIC, 1 MG/DOSE, 4 MG/3ML SOPN Inject 1 mg into the skin once a week. Patient not currently not taking     polyethylene glycol powder (MIRALAX) 17 GM/SCOOP powder Use 17 g (1 capful/scoop) in 8 ounces of water once daily. 850 g 2   Probiotic Product (ALIGN) CHEW Chew 1 capsule by mouth daily.     Propylene Glycol (SYSTANE COMPLETE) 0.6 % SOLN Place 1 drop into both eyes as needed (dry eyes).     sucralfate (CARAFATE) 1 g tablet Take 1 tablet (1 g total) by mouth 3 (three) times daily as needed (stomach issues). 360 tablet 0   sucralfate (CARAFATE) 1 GM/10ML suspension Take 10 mLs (1 g total) by mouth 4 (four) times daily -  with meals and at bedtime. 420 mL 0   Ubrogepant (UBRELVY) 100 MG TABS Take 1 tablet (100 mg total) by mouth as needed. 10 tablet 5   No current facility-administered medications for this visit.    Allergies: Allergies  Allergen Reactions   Ibuprofen Shortness Of Breath and Other (See Comments)    Lung specialist told her not to take    Macrobid [Nitrofurantoin Macrocrystal] Shortness Of Breath    Lung specialist Told it would kill her  SOB   Baclofen Other (See Comments)   Morphine And Codeine Nausea Only    dizziness   Topiramate Other (See Comments)    Cognitive side effects.   Tramadol     Low blood pressure   Latex Rash   Other Palpitations    PSEUDO COUGH DECONGESTANT   Pseudoephedrine Palpitations    Past Medical History:  Diagnosis Date   Abdominal pain    Allergies    Anemia    Anxiety    Arthritis    Cancer (HCC)    skin   Complication of anesthesia 1991   dizziness; almost passed out, post op    Depression    Dyspnea    with exertion   Family history of breast cancer    Cousins on both sides of family   Fatty liver    GERD (gastroesophageal reflux disease) 03/01/2017   History of hiatal hernia    History of melanoma    History of recurrent UTIs    Migraine    last botox injection 04-13-23   Multiple nevi    Obstructive sleep apnea    Panic disorder 1995   Polycystic ovarian disease    Past Surgical History:  Procedure Laterality Date   BIOPSY  04/09/2017   Procedure: BIOPSY;  Surgeon: Malissa Hippo, MD;  Location: [facility];  Service: Endoscopy;;  gastric esophageal    BIOPSY  02/02/2023   Procedure: BIOPSY;  Surgeon: Dolores Frame, MD;  Location: [facility];  Service: Gastroenterology;;   CHOLECYSTECTOMY  1991   COLONOSCOPY  2016   DILATION AND CURETTAGE OF UTERUS     ESOPHAGOGASTRODUODENOSCOPY N/A 04/09/2017   Procedure: ESOPHAGOGASTRODUODENOSCOPY (EGD);  Surgeon: Malissa Hippo, MD;  Location:  [facility];  Service: Endoscopy;  Laterality: N/A;  9:25   ESOPHAGOGASTRODUODENOSCOPY (EGD) WITH PROPOFOL N/A 02/02/2023   Procedure: ESOPHAGOGASTRODUODENOSCOPY (EGD) WITH PROPOFOL;  Surgeon: Dolores Frame, MD;  Location: [facility];  Service: Gastroenterology;  Laterality: N/A;  2:30 pm, moved to 5/14 per Tanya   EYE SURGERY Left 10/23/2021   LAPAROSCOPIC GASTRIC BANDING  07/07/2011   banding removed but has two clamps   LAPAROSCOPIC REPAIR AND REMOVAL OF GASTRIC BAND     LUMBAR LAMINECTOMY/ DECOMPRESSION WITH MET-RX Right 02/09/2023   Procedure: Right Lumbar four-five Minimally Invasive Laminectomy, Discectomy;  Surgeon: Jadene Pierini, MD;  Location: [facility];  Service: Neurosurgery;  Laterality: Right;   LUMBAR LAMINECTOMY/ DECOMPRESSION WITH MET-RX N/A 04/19/2023   Procedure: REVISION LUMBAR FOUR-LUMBAR FIVE MICRODISCECTOMY;  Surgeon: Jadene Pierini, MD;  Location: [facility];  Service: Neurosurgery;  Laterality: N/A;  3C   UPPER  GI ENDOSCOPY N/A 11/21/2012   Procedure: UPPER GI ENDOSCOPY [name] DIAGNOSTIC LAPAROSCOPY /REMOVAL LAP GASTRIC BAND/INSERTION OF DRAIN;  Surgeon: Valarie Merino, MD;  Location: [facility];  Service: General;  Laterality: N/A;   Family History  Problem Relation Age of Onset   Cervical cancer Mother    Stroke Mother    Aneurysm Mother    Cervical cancer Sister    Other Paternal Grandmother        had part of colon removed   Breast cancer Cousin        Both sides of family   Cancer - Colon Paternal Uncle    Social History   Socioeconomic History   Marital status: Married    Spouse name: Not on file   Number of children: 1   Years of education: Not on file   Highest education level: Some college, no degree  Occupational History    Comment: na  Tobacco Use   Smoking status: Never   Smokeless tobacco: Never  Vaping Use   Vaping status: Never Used  Substance and Sexual Activity   Alcohol use: Not Currently    Comment: wine/liquor maybe once a month   Drug use: No   Sexual activity: Yes    Birth control/protection: I.U.D.    Comment: mirena  Other Topics Concern   Not on file  Social History Narrative   Lives with spouse   Caffeine- none   Social Drivers of Corporate investment banker Strain: Not on file  Food Insecurity: Not on file  Transportation Needs: Not on file  Physical Activity: Not on file  Stress: Not on file  Social Connections: Unknown (02/02/2022)   [facility], [facility]   Social Network    Social Network: Not on file  Intimate Partner Violence: Not At Risk (12/01/2022)   [facility], [facility]   Humiliation, Afraid, Rape, and Kick questionnaire    Fear of Current or Ex-Partner: No    Emotionally Abused: No    Physically Abused: No    Sexually Abused: No    SUBJECTIVE  Review of Systems Constitutional: Patient denies any unintentional weight loss or change in strength lntegumentary: Patient denies  any rashes or pruritus Cardiovascular: Patient denies chest pain or syncope Respiratory: Patient denies shortness of breath Gastrointestinal: Patient ***denies nausea, vomiting, constipation, or diarrhea Musculoskeletal: Patient denies muscle cramps or weakness Neurologic: Patient denies convulsions or seizures Allergic/Immunologic: Patient denies recent allergic reaction(s) Hematologic/Lymphatic: Patient denies bleeding tendencies Endocrine: Patient denies heat/cold intolerance  GU: As per HPI.  OBJECTIVE There were no vitals filed for this visit. There is no height or weight on file to calculate BMI.  Physical Examination Constitutional: No obvious distress; patient is non-toxic appearing  Cardiovascular: No visible lower extremity edema.  Respiratory: The patient does not have audible wheezing/stridor; respirations do not appear labored  Gastrointestinal: Abdomen non-distended Musculoskeletal: Normal ROM of UEs  Skin: No obvious rashes/open sores  Neurologic: CN 2-12 grossly intact Psychiatric: Answered questions appropriately with normal affect  Hematologic/Lymphatic/Immunologic: No obvious bruises or sites of spontaneous bleeding  UA: ***negative / *** WBC/hpf, *** RBC/hpf, *** bacteria ***Urine microscopy: ***negative / *** WBC/hpf, *** RBC/hpf, *** bacteria ***with no evidence of UTI ***with no evidence of microscopic hematuria ***otherwise unremarkable  PVR: *** ml  ASSESSMENT No diagnosis found.  We discussed the different forms of urinary incontinence, such as stress and urge incontinence, and described how symptoms are consistent with mixed urinary incontinence.  1. For treatment of stress urinary incontinence: The etiology of this condition was explained in detail to include pelvic floor muscle relaxation and detachment of the urethra away from its connection to the pubic bone. Her risk profile was reviewed, including childbirth.  ***A visual demonstration of the  pathophysiology of stress urinary incontinence was reviewed.  The management options were reviewed to include: No intervention, observation. Non-surgical options: Pelvic floor muscle rehabilitation Weight loss Incontinence pessary Surgical consultation  ***Options may include a midurethral sling (with synthetic mesh implant or autologous fascial sling), a Burch urethropexy, or transurethral injection of a bulking agent. Detailed discussion was had regarding the potential risks of both procedures, including: mesh exposure, mesh erosion, failure to resolve SUI, ineffectiveness of these procedures for urge UI, dyspareunia, voiding dysfunction and/or retention.  She elected to proceed with ***.  *** Consider possibility of vaginal voiding if pt has recessed urethra and only leaks around 1 tsp at a time. Some urine can collect in vagina and leak upon standing after voiding. If this is suspected, advise them to lean forward while voiding and wipe inside vagina after voiding for 3 months. If symptoms persist, then move on to UDT for further evaluation.  2. For treatment of OAB with urinary frequency, nocturia, urgency, and urge incontinence: We discussed the symptoms of overactive bladder (OAB), which include urinary urgency, frequency, nocturia, with or without urge incontinence.   While we may not know the exact etiology of OAB, several risk factors can be identified.  - We discussed this patient's neurogenic risk factors for OAB-type symptoms including ***T2DM ***with neuropathy, ***nicotine use, ***spinal stenosis, ***prior stroke, ***dementia.  - Likely exacerbated by ***diuretic use, ***caffeine intake, ***glucosuria (due to ***Jardiance / ***Farxiga use).   We discussed the following management options in detail including potential benefits, risks, and side effects: Behavioral therapy: Modify fluid intake Decreasing bladder irritants (such as caffeine) Urge suppression strategies Bladder  retraining / timed voiding Double voiding Medication(s): ***- For anticholinergic medications, we discussed the potential side effects of anticholinergics including dry eyes, dry mouth, constipation, cognitive impairment and urinary retention.  ***- Not a safe candidate for anticholinergic medications due to risk for side effects based on patient's age, comorbidities, and pre-existing dry mouth / dry eyes.  - For beta-3 agonist medication, we discussed the risk for urinary retention and the potential side effect of elevated blood pressure specific to Myrbetriq (which is more likely to occur in individuals with uncontrolled hypertension).  ***- Combo therapy with anticholinergic medication + beta-3 agonist medication 3. For refractory cases: PTNS (posterior tibial nerve  stimulation) Sacral neuromodulation trial (Medtronic lnterStim or Axonics implant) Bladder Botox injections  ***In order to further evaluate urinary incontinence, She was instructed to complete a 3-day bladder diary.  ***Discussed recommendation for urodynamic testing, which was described in detail including risks such as bleeding, infection, organ/tissue/nerve damage.  She decided to proceed with *** ***work on behavioral modifications including ***minimizing caffeine intake and working on ***timed voiding / bladder retraining.   Will plan for follow up in ***8 weeks / *** months or sooner if needed. Pt verbalized understanding and agreement. All questions were answered.  PLAN Advised the following: *** ***. Minimize caffeine intake. ***. Work on timed voiding. ***. Double/ triple voiding. ***. No follow-ups on file.  No orders of the defined types were placed in this encounter.   It has been explained that the patient is to follow regularly with their PCP in addition to all other providers involved in their care and to follow instructions provided by these respective offices. Patient advised to contact urology clinic  if any urologic-pertaining questions, concerns, new symptoms or problems arise in the interim period.  There are no Patient Instructions on file for this visit.  Electronically signed by:  Donnita Falls, MSN, FNP-C, CUNP 09/30/2023 5:21 PM

## 2023-10-07 ENCOUNTER — Ambulatory Visit: Payer: Medicaid Other | Admitting: Urology

## 2023-10-07 DIAGNOSIS — N393 Stress incontinence (female) (male): Secondary | ICD-10-CM

## 2023-10-11 ENCOUNTER — Encounter: Payer: Self-pay | Admitting: Internal Medicine

## 2023-10-11 ENCOUNTER — Encounter (HOSPITAL_COMMUNITY): Payer: Self-pay

## 2023-10-11 ENCOUNTER — Ambulatory Visit: Payer: Medicaid Other | Attending: Internal Medicine

## 2023-10-11 DIAGNOSIS — R0602 Shortness of breath: Secondary | ICD-10-CM | POA: Diagnosis not present

## 2023-10-11 DIAGNOSIS — R079 Chest pain, unspecified: Secondary | ICD-10-CM | POA: Diagnosis not present

## 2023-10-11 LAB — ECHOCARDIOGRAM COMPLETE
AR max vel: 2.45 cm2
AV Area VTI: 2.54 cm2
AV Area mean vel: 2.27 cm2
AV Mean grad: 3 mm[Hg]
AV Peak grad: 4.9 mm[Hg]
Ao pk vel: 1.11 m/s
Area-P 1/2: 4.46 cm2
Calc EF: 63.4 %
MV VTI: 2.86 cm2
S' Lateral: 3.5 cm
Single Plane A2C EF: 58.7 %
Single Plane A4C EF: 69.5 %

## 2023-10-11 MED ORDER — PERFLUTREN LIPID MICROSPHERE
1.0000 mL | INTRAVENOUS | Status: AC | PRN
  Administered 2023-10-11: 4.5 mL via INTRAVENOUS

## 2023-10-12 ENCOUNTER — Other Ambulatory Visit: Payer: Self-pay

## 2023-10-12 ENCOUNTER — Encounter: Payer: Self-pay | Admitting: Internal Medicine

## 2023-10-12 MED ORDER — METOPROLOL TARTRATE 100 MG PO TABS: 100.0000 mg | ORAL_TABLET | Freq: Once | ORAL | 0 refills | Status: DC

## 2023-10-13 ENCOUNTER — Other Ambulatory Visit: Payer: Self-pay

## 2023-10-13 MED ORDER — ONDANSETRON 4 MG PO TBDP: ORAL_TABLET | ORAL | 0 refills | Status: DC

## 2023-10-18 ENCOUNTER — Ambulatory Visit (INDEPENDENT_AMBULATORY_CARE_PROVIDER_SITE_OTHER): Payer: Medicaid Other | Admitting: Clinical

## 2023-10-18 DIAGNOSIS — F419 Anxiety disorder, unspecified: Secondary | ICD-10-CM | POA: Diagnosis not present

## 2023-10-18 DIAGNOSIS — F331 Major depressive disorder, recurrent, moderate: Secondary | ICD-10-CM

## 2023-10-18 DIAGNOSIS — F410 Panic disorder [episodic paroxysmal anxiety] without agoraphobia: Secondary | ICD-10-CM

## 2023-10-18 NOTE — Progress Notes (Signed)
Virtual Visit via Video Note   I connected with Toshi Ishii on 10/18/23 at 3:00 PM EST by a video enabled telemedicine application and verified that I am speaking with the correct person using two identifiers.   Location: Patient: Home Provider: Office   I discussed the limitations of evaluation and management by telemedicine and the availability of in person appointments. The patient expressed understanding and agreed to proceed.   THERAPIST PROGRESS NOTE     Session Time: 3:00 PM-3:30 PM   Participation Level: Active   Behavioral Response: Casual and Alert,Depressed   Type of Therapy: Individual Therapy   Treatment Goals addressed: Depression and Anxiety   Interventions: CBT   Summary: Kimberly Ortega is a 58 y.o. female who presents with Depression with Anxiety and Panic Disorder. The OPT therapist worked with the patient for her ongoing OPT treatment. The OPT therapist utilized Motivational Interviewing to assist in creating therapeutic repore. The patient in the session was engaged and work in collaboration giving feedback about her triggers and symptoms over the past few days through the holidays and into the new year. The patient spoke about her ongoing difficulty with being still in the middle of a divorce. The OPT therapist utilized Cognitive Behavioral Therapy through cognitive restructuring as well as worked with the patient on coping strategies to assist in management of mood. The OPT therapist overviewed in session with the patient basic need areas examining the patients current eating habits, sleep schedule, exercise, and hygiene. The OPT therapist overviewed with the patient since the last few weeks  and post the separation her living situation . The patient notes she continues to struggle with her anxiety, however, her physicians noted to the patient that until her life situation/circumstances her difficulty with anxiety wont change and they will not change her medication. The  patient is currently in the process of getting advice around her legal rights as she moves forward in her separation/divorce from her ex-husband. The patient will be reaching out to legal aid to get representation for her divorce if they will take her case.   Suicidal/Homicidal: Nowithout intent/plan   Therapist Response:The OPT therapist worked with the patient for the patients scheduled session. The patient was engaged in her session and gave feedback in relation to triggers, symptoms, and behavior responses over the past few weeks. The OPT therapist worked with the patient utilizing an in session Cognitive Behavioral Therapy exercise. The patient was responsive in the session and verbalized, " I want to be out of her and I want to feel free from this situation and I do not want to be here and ready to get out but every where has a waiting list ". The patient spoke her ongoing work to get additional funding through her disability services. The OPT therapist worked with the patient to challenge her negative thoughts and empower her to continue to work with her health providers in working her health problems and getting to desired outcomes. The OPT therapist worked with the patient on importance of utilizing coping skills and consistency with the recommendations ongoing of her physical health providers.The OPT therapist reviewed the patients current support network .The OPT therapist overviewed with the patient should she go into crisis to report to nearest hospital for inpatient evaluation and treatment services. The OPT therapist overviewed through in session exercise the in my control vs not in my control exercise.The patient continues to work to get representation and legal advice in relation to her current situation (separation/divorce).The OPT  therapist will continue treatment work with the patient in her next scheduled session.   Plan: Return again in 2/3 weeks.   Diagnosis:      Axis I: Recurrent  Moderate Major Depressive Disorder with Anxiety. Panic Disorder    Axis II: No diagnosis      Collaboration of Care: No additional collaboration of care for this session.   Patient/Guardian was advised Release of Information must be obtained prior to any record release in order to collaborate their care with an outside provider. Patient/Guardian was advised if they have not already done so to contact the registration department to sign all necessary forms in order for [name] to release information regarding their care.    Consent: Patient/Guardian gives verbal consent for treatment and assignment of benefits for services provided during this visit. Patient/Guardian expressed understanding and agreed to proceed.    I discussed the assessment and treatment plan with the patient. The patient was provided an opportunity to ask questions and all were answered. The patient agreed with the plan and demonstrated an understanding of the instructions.   The patient was advised to call back or seek an in-person evaluation if the symptoms worsen or if the condition fails to improve as anticipated.   I provided 30 minutes of non-face-to-face time during this encounter.   [name], LCSW   10/18/2023

## 2023-10-19 ENCOUNTER — Encounter: Payer: Self-pay | Admitting: Internal Medicine

## 2023-10-19 ENCOUNTER — Encounter (HOSPITAL_COMMUNITY): Payer: Self-pay

## 2023-10-20 ENCOUNTER — Telehealth: Payer: Self-pay | Admitting: Family Medicine

## 2023-10-20 ENCOUNTER — Other Ambulatory Visit: Payer: Self-pay | Admitting: Gastroenterology

## 2023-10-20 NOTE — Telephone Encounter (Signed)
Letter sent to the pt

## 2023-10-20 NOTE — Progress Notes (Unsigned)
10/21/23 ALL: Kimberly Ortega returns for Botox. She continues to do well. Migraines are well managed until 1-2 weeks before next procedure. [name] helps with abortive therapy.   07/06/2023 ALL: Kimberly Ortega returns for Botox. She continues to do well. Migraines wax and wane but usually fairly well managed. She uses Ubrelvy for abortive therapy. Usually takes about 6-8 tablets a month.   04/13/2023 ALL: Kimberly Ortega returns for Botox. She is doing well on therapy. [name] works well. She was started on CPAP therapy but unsure if it has been beneficial. Compliance review with Dr Vickey Huger 03/30/2023 showed excellent compliance and residual AHI well managed.   01/18/2023 ALL: Kimberly Ortega returns for Botox. She reports doing better. Averaging about 6 migraine days a month. [name] does seem to help. She is averaging 3-4 doses per month. Rarely using rizatriptan.   10/22/2022 ALL: Kimberly Ortega returns for Botox.She reports migraines have improved by at least 50%. Nurtec did not seem much more effective than rizatriptan.   07/27/2022 ALL: Kimberly Ortega presents for her first Botox procedure. Baseline 15 migraine days per month. She was previously taking amitriptyline 20mg  at bedtime and propranolol LA 60mg  daily. She discontinued both propranolol and amitriptyline a few weeks ago. She continues rizatriptan as needed.     Consent Form Botulism Toxin Injection For Chronic Migraine    Reviewed orally with patient, additionally signature is on file:  Botulism toxin has been approved by the Federal drug administration for treatment of chronic migraine. Botulism toxin does not cure chronic migraine and it may not be effective in some patients.  The administration of botulism toxin is accomplished by injecting a small amount of toxin into the muscles of the neck and head. Dosage must be titrated for each individual. Any benefits resulting from botulism toxin tend to wear off after 3 months with a repeat injection required if benefit is to be  maintained. Injections are usually done every 3-4 months with maximum effect peak achieved by about 2 or 3 weeks. Botulism toxin is expensive and you should be sure of what costs you will incur resulting from the injection.  The side effects of botulism toxin use for chronic migraine may include:   -Transient, and usually mild, facial weakness with facial injections  -Transient, and usually mild, head or neck weakness with head/neck injections  -Reduction or loss of forehead facial animation due to forehead muscle weakness  -Eyelid drooping  -Dry eye  -Pain at the site of injection or bruising at the site of injection  -Double vision  -Potential unknown long term risks   Contraindications: You should not have Botox if you are pregnant, nursing, allergic to albumin, have an infection, skin condition, or muscle weakness at the site of the injection, or have myasthenia gravis, Lambert-Eaton syndrome, or ALS.  It is also possible that as with any injection, there may be an allergic reaction or no effect from the medication. Reduced effectiveness after repeated injections is sometimes seen and rarely infection at the injection site may occur. All care will be taken to prevent these side effects. If therapy is given over a long time, atrophy and wasting in the muscle injected may occur. Occasionally the patient's become refractory to treatment because they develop antibodies to the toxin. In this event, therapy needs to be modified.  I have read the above information and consent to the administration of botulism toxin.    BOTOX PROCEDURE NOTE FOR MIGRAINE HEADACHE  Contraindications and precautions discussed with patient(above). Aseptic procedure was observed and patient  tolerated procedure. Procedure performed by Shawnie Dapper, FNP-C.   The condition has existed for more than 6 months, and pt does not have a diagnosis of ALS, Myasthenia Gravis or Lambert-Eaton Syndrome.  Risks and benefits of  injections discussed and pt agrees to proceed with the procedure.  Written consent obtained  These injections are medically necessary. Pt  receives good benefits from these injections. These injections do not cause sedations or hallucinations which the oral therapies may cause.   Description of procedure:  The patient was placed in a sitting position. The standard protocol was used for Botox as follows, with 5 units of Botox injected at each site:  -Procerus muscle, midline injection  -Corrugator muscle, bilateral injection  -Frontalis muscle, bilateral injection, with 2 sites each side, medial injection was performed in the upper one third of the frontalis muscle, in the region vertical from the medial inferior edge of the superior orbital rim. The lateral injection was again in the upper one third of the forehead vertically above the lateral limbus of the cornea, 1.5 cm lateral to the medial injection site.  -Temporalis muscle injection, 4 sites, bilaterally. The first injection was 3 cm above the tragus of the ear, second injection site was 1.5 cm to 3 cm up from the first injection site in line with the tragus of the ear. The third injection site was 1.5-3 cm forward between the first 2 injection sites. The fourth injection site was 1.5 cm posterior to the second injection site. 5th site laterally in the temporalis  muscleat the level of the outer canthus.  -Occipitalis muscle injection, 3 sites, bilaterally. The first injection was done one half way between the occipital protuberance and the tip of the mastoid process behind the ear. The second injection site was done lateral and superior to the first, 1 fingerbreadth from the first injection. The third injection site was 1 fingerbreadth superiorly and medially from the first injection site.  -Cervical paraspinal muscle injection, 2 sites, bilaterally. The first injection site was 1 cm from the midline of the cervical spine, 3 cm inferior to  the lower border of the occipital protuberance. The second injection site was 1.5 cm superiorly and laterally to the first injection site.  -Trapezius muscle injection was performed at 3 sites, bilaterally. The first injection site was in the upper trapezius muscle halfway between the inflection point of the neck, and the acromion. The second injection site was one half way between the acromion and the first injection site. The third injection was done between the first injection site and the inflection point of the neck.   Will return for repeat injection in 3 months.   A total of 200 units of Botox was prepared, 155 units of Botox was injected as documented above, any Botox not injected was wasted. The patient tolerated the procedure well, there were no complications of the above procedure.
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
## 2023-10-21 ENCOUNTER — Ambulatory Visit: Payer: Medicaid Other | Admitting: Family Medicine

## 2023-10-21 ENCOUNTER — Ambulatory Visit (HOSPITAL_COMMUNITY)
Admission: RE | Admit: 2023-10-21 | Discharge: 2023-10-21 | Disposition: A | Discharge status: Home / Self Care | Payer: Medicaid Other | Source: Ambulatory Visit | Attending: Internal Medicine | Admitting: Internal Medicine

## 2023-10-21 DIAGNOSIS — R0602 Shortness of breath: Secondary | ICD-10-CM | POA: Insufficient documentation

## 2023-10-21 DIAGNOSIS — G43109 Migraine with aura, not intractable, without status migrainosus: Secondary | ICD-10-CM

## 2023-10-21 DIAGNOSIS — R079 Chest pain, unspecified: Secondary | ICD-10-CM | POA: Insufficient documentation

## 2023-10-21 MED ORDER — DILTIAZEM HCL 25 MG/5ML IV SOLN: 10.0000 mg | INTRAVENOUS | Status: DC | PRN

## 2023-10-21 MED ORDER — METOPROLOL TARTRATE 5 MG/5ML IV SOLN
INTRAVENOUS | Status: AC
Start: 2023-10-21 — End: ?
  Filled 2023-10-21: qty 10

## 2023-10-21 MED ORDER — NITROGLYCERIN 0.4 MG SL SUBL
SUBLINGUAL_TABLET | SUBLINGUAL | Status: AC
Start: 2023-10-21 — End: ?
  Filled 2023-10-21: qty 2

## 2023-10-21 MED ORDER — DILTIAZEM HCL 25 MG/5ML IV SOLN
INTRAVENOUS | Status: AC
  Filled 2023-10-21: qty 5

## 2023-10-21 MED ORDER — ONABOTULINUMTOXINA 200 UNITS IJ SOLR
155.0000 [IU] | Freq: Once | INTRAMUSCULAR | Status: AC
  Administered 2023-10-21: 155 [IU] via INTRAMUSCULAR

## 2023-10-21 MED ORDER — METOPROLOL TARTRATE 5 MG/5ML IV SOLN
10.0000 mg | Freq: Once | INTRAVENOUS | Status: AC | PRN
  Administered 2023-10-21: 10 mg via INTRAVENOUS

## 2023-10-21 MED ORDER — IOHEXOL 350 MG/ML SOLN
95.0000 mL | Freq: Once | INTRAVENOUS | Status: AC | PRN
  Administered 2023-10-21: 95 mL via INTRAVENOUS

## 2023-10-21 MED ORDER — NITROGLYCERIN 0.4 MG SL SUBL
0.8000 mg | SUBLINGUAL_TABLET | Freq: Once | SUBLINGUAL | Status: AC
  Administered 2023-10-21: 0.8 mg via SUBLINGUAL

## 2023-10-21 NOTE — Progress Notes (Incomplete)
Patient tolerated CT well. VSS. Encouraged to drink water throughout day. Reasons explained and verbalized understanding. Ambulated, steady gate.

## 2023-10-21 NOTE — Progress Notes (Signed)
Botox- 200 units x 1 vial Lot: [id number] Expiration: 12/2025 NDC: 2130-8657-84  Bacteriostatic 0.9% Sodium Chloride- * mL  Lot: [id number] Expiration: 07/22/2024 NDC: 2841-3244-01  Dx: U27.253 B/B Witnessed by Christophe Louis, CMA

## 2023-11-16 ENCOUNTER — Ambulatory Visit (INDEPENDENT_AMBULATORY_CARE_PROVIDER_SITE_OTHER): Payer: Medicaid Other | Admitting: Clinical

## 2023-11-16 DIAGNOSIS — F410 Panic disorder [episodic paroxysmal anxiety] without agoraphobia: Secondary | ICD-10-CM | POA: Diagnosis not present

## 2023-11-16 DIAGNOSIS — F331 Major depressive disorder, recurrent, moderate: Secondary | ICD-10-CM | POA: Diagnosis not present

## 2023-11-16 DIAGNOSIS — F419 Anxiety disorder, unspecified: Secondary | ICD-10-CM | POA: Diagnosis not present

## 2023-11-16 NOTE — Progress Notes (Signed)
 Virtual Visit via Video Note   I connected with Kimberly Ortega on 11/16/23 at 3:00 PM EST by a video enabled telemedicine application and verified that I am speaking with the correct person using two identifiers.   Location: Patient: Home Provider: Office   I discussed the limitations of evaluation and management by telemedicine and the availability of in person appointments. The patient expressed understanding and agreed to proceed.   THERAPIST PROGRESS NOTE     Session Time: 3:00 PM-3:30 PM   Participation Level: Active   Behavioral Response: Casual and Alert,Depressed   Type of Therapy: Individual Therapy   Treatment Goals addressed: Depression and Anxiety   Interventions: CBT   Summary: Kimberly Ortega is a 58 y.o. female who presents with Depression with Anxiety and Panic Disorder. The OPT therapist worked with the patient for her ongoing OPT treatment. The OPT therapist utilized Motivational Interviewing to assist in creating therapeutic repore. The patient in the session was engaged and work in collaboration giving feedback about her triggers and symptoms over the past few weeks through February. The patient spoke about her ongoing difficulty with being still in the middle of a divorce and deciding it is time for her to move out of the home she has been staying in and previously shared with her husband. The OPT therapist utilized Cognitive Behavioral Therapy through cognitive restructuring as well as worked with the patient on coping strategies to assist in management of mood. The OPT therapist overviewed in session with the patient basic need areas examining the patients current eating habits, sleep schedule, exercise, and hygiene. The patient notes she continues to struggle with her anxiety, however, her physicians noted to the patient that until her life situation/circumstances her difficulty with anxiety wont change and they will not change her medication. The patient is currently in  the process of getting advice around her legal rights as she moves forward in her separation/divorce from her ex-husband. The patient will be reaching out to legal aid to get representation for her divorce if they will take her case.   Suicidal/Homicidal: Nowithout intent/plan   Therapist Response:The OPT therapist worked with the patient for the patients scheduled session. The patient was engaged in her session and gave feedback in relation to triggers, symptoms, and behavior responses over the past few weeks. The OPT therapist worked with the patient utilizing an in session Cognitive Behavioral Therapy exercise. The patient was responsive in the session and verbalized, " I am moving things in this house out to storage and that way when he moves in and I move out he doesn't get the rest of the little things I have". The patient spoke her ongoing work to get additional funding through her disability services. The OPT therapist worked with the patient to challenge her negative thoughts and empower her to continue to work with her health providers in working her health problems and getting to desired outcomes. The OPT therapist worked with the patient on importance of utilizing coping skills and consistency with the recommendations ongoing of her physical health providers.The OPT therapist reviewed the patients current support network .The OPT therapist overviewed with the patient should she go into crisis to report to nearest hospital for inpatient evaluation and treatment services. The OPT therapist overviewed through in session exercise the in my control vs not in my control exercise.The patient continues to work to get representation and legal advice in relation to her current situation (separation/divorce). The patient spoke about deciding it is time to  move on and move out of the house and she she is not sure where she is going to live yet but has been moving her things into a family storage unit. The OPT  therapist will continue treatment work with the patient in her next scheduled session.   Plan: Return again in 2/3 weeks.   Diagnosis:      Axis I: Recurrent Moderate Major Depressive Disorder with Anxiety. Panic Disorder    Axis II: No diagnosis     Collaboration of Care: No additional collaboration of care for this session.   Patient/Guardian was advised Release of Information must be obtained prior to any record release in order to collaborate their care with an outside provider. Patient/Guardian was advised if they have not already done so to contact the registration department to sign all necessary forms in order for [name] to release information regarding their care.    Consent: Patient/Guardian gives verbal consent for treatment and assignment of benefits for services provided during this visit. Patient/Guardian expressed understanding and agreed to proceed.    I discussed the assessment and treatment plan with the patient. The patient was provided an opportunity to ask questions and all were answered. The patient agreed with the plan and demonstrated an understanding of the instructions.   The patient was advised to call back or seek an in-person evaluation if the symptoms worsen or if the condition fails to improve as anticipated.   I provided 30 minutes of non-face-to-face time during this encounter.   [name], LCSW   11/16/2023

## 2023-11-25 ENCOUNTER — Encounter: Payer: Self-pay | Admitting: Gastroenterology

## 2023-12-14 ENCOUNTER — Other Ambulatory Visit: Payer: Self-pay | Admitting: *Deleted

## 2023-12-14 DIAGNOSIS — G43109 Migraine with aura, not intractable, without status migrainosus: Secondary | ICD-10-CM

## 2023-12-14 MED ORDER — UBRELVY 100 MG PO TABS: 1.0000 | ORAL_TABLET | ORAL | 0 refills | Status: DC | PRN

## 2023-12-14 NOTE — Telephone Encounter (Signed)
 Last seen on 10/21/23 Follow up scheduled on 01/20/24

## 2023-12-20 ENCOUNTER — Ambulatory Visit (HOSPITAL_COMMUNITY): Payer: Medicaid Other | Admitting: Clinical

## 2023-12-21 ENCOUNTER — Encounter: Payer: Self-pay | Admitting: *Deleted

## 2024-01-12 ENCOUNTER — Other Ambulatory Visit: Payer: Self-pay | Admitting: *Deleted

## 2024-01-12 MED ORDER — ONDANSETRON 4 MG PO TBDP: ORAL_TABLET | ORAL | 0 refills | Status: DC

## 2024-01-19 NOTE — Progress Notes (Signed)
 01/20/24 ALL: Kimberly Ortega returns for follow up for Botox . Migraines remain fairly well managed. She does report worsening 2-4 weeks prior to next procedure. She continues Ubrelvy  for abortive therapy and it works well.   10/21/2023 ALL: Kimberly Ortega returns for Botox . She continues to do well. Migraines are well managed until 1-2 weeks before next procedure. Ubrelvy  helps with abortive therapy.   07/06/2023 ALL: Kimberly Ortega returns for Botox . She continues to do well. Migraines wax and wane but usually fairly well managed. She uses Ubrelvy  for abortive therapy. Usually takes about 6-8 tablets a month.   04/13/2023 ALL: Kimberly Ortega returns for Botox . She is doing well on therapy. Ubrelvy  works well. She was started on CPAP therapy but unsure if it has been beneficial. Compliance review with Dr Albertina Hugger 03/30/2023 showed excellent compliance and residual AHI well managed.   01/18/2023 ALL: Kimberly Ortega returns for Botox . She reports doing better. Averaging about 6 migraine days a month. Ubrelvy  does seem to help. She is averaging 3-4 doses per month. Rarely using rizatriptan .   10/22/2022 ALL: Kimberly Ortega returns for Botox .She reports migraines have improved by at least 50%. Nurtec did not seem much more effective than rizatriptan .   07/27/2022 ALL: Kimberly Ortega presents for her first Botox  procedure. Baseline 15 migraine days per month. She was previously taking amitriptyline  20mg  at bedtime and propranolol  LA 60mg  daily. She discontinued both propranolol  and amitriptyline  a few weeks ago. She continues rizatriptan  as needed.     Consent Form Botulism Toxin Injection For Chronic Migraine    Reviewed orally with patient, additionally signature is on file:  Botulism toxin has been approved by the Federal drug administration for treatment of chronic migraine. Botulism toxin does not cure chronic migraine and it may not be effective in some patients.  The administration of botulism toxin is accomplished by injecting a small amount of  toxin into the muscles of the neck and head. Dosage must be titrated for each individual. Any benefits resulting from botulism toxin tend to wear off after 3 months with a repeat injection required if benefit is to be maintained. Injections are usually done every 3-4 months with maximum effect peak achieved by about 2 or 3 weeks. Botulism toxin is expensive and you should be sure of what costs you will incur resulting from the injection.  The side effects of botulism toxin use for chronic migraine may include:   -Transient, and usually mild, facial weakness with facial injections  -Transient, and usually mild, head or neck weakness with head/neck injections  -Reduction or loss of forehead facial animation due to forehead muscle weakness  -Eyelid drooping  -Dry eye  -Pain at the site of injection or bruising at the site of injection  -Double vision  -Potential unknown long term risks   Contraindications: You should not have Botox  if you are pregnant, nursing, allergic to albumin, have an infection, skin condition, or muscle weakness at the site of the injection, or have myasthenia gravis, Lambert-Eaton syndrome, or ALS.  It is also possible that as with any injection, there may be an allergic reaction or no effect from the medication. Reduced effectiveness after repeated injections is sometimes seen and rarely infection at the injection site may occur. All care will be taken to prevent these side effects. If therapy is given over a long time, atrophy and wasting in the muscle injected may occur. Occasionally the patient's become refractory to treatment because they develop antibodies to the toxin. In this event, therapy needs to be modified.  I have read the above information and consent to the administration of botulism toxin.    BOTOX  PROCEDURE NOTE FOR MIGRAINE HEADACHE  Contraindications and precautions discussed with patient(above). Aseptic procedure was observed and patient tolerated  procedure. Procedure performed by Terrilyn Fick, FNP-C.   The condition has existed for more than 6 months, and pt does not have a diagnosis of ALS, Myasthenia Gravis or Lambert-Eaton Syndrome.  Risks and benefits of injections discussed and pt agrees to proceed with the procedure.  Written consent obtained  These injections are medically necessary. Pt  receives good benefits from these injections. These injections do not cause sedations or hallucinations which the oral therapies may cause.   Description of procedure:  The patient was placed in a sitting position. The standard protocol was used for Botox  as follows, with 5 units of Botox  injected at each site:  -Procerus muscle, midline injection  -Corrugator muscle, bilateral injection  -Frontalis muscle, bilateral injection, with 2 sites each side, medial injection was performed in the upper one third of the frontalis muscle, in the region vertical from the medial inferior edge of the superior orbital rim. The lateral injection was again in the upper one third of the forehead vertically above the lateral limbus of the cornea, 1.5 cm lateral to the medial injection site.  -Temporalis muscle injection, 4 sites, bilaterally. The first injection was 3 cm above the tragus of the ear, second injection site was 1.5 cm to 3 cm up from the first injection site in line with the tragus of the ear. The third injection site was 1.5-3 cm forward between the first 2 injection sites. The fourth injection site was 1.5 cm posterior to the second injection site. 5th site laterally in the temporalis  muscleat the level of the outer canthus.  -Occipitalis muscle injection, 3 sites, bilaterally. The first injection was done one half way between the occipital protuberance and the tip of the mastoid process behind the ear. The second injection site was done lateral and superior to the first, 1 fingerbreadth from the first injection. The third injection site was 1  fingerbreadth superiorly and medially from the first injection site.  -Cervical paraspinal muscle injection, 2 sites, bilaterally. The first injection site was 1 cm from the midline of the cervical spine, 3 cm inferior to the lower border of the occipital protuberance. The second injection site was 1.5 cm superiorly and laterally to the first injection site.  -Trapezius muscle injection was performed at 3 sites, bilaterally. The first injection site was in the upper trapezius muscle halfway between the inflection point of the neck, and the acromion. The second injection site was one half way between the acromion and the first injection site. The third injection was done between the first injection site and the inflection point of the neck.   Will return for repeat injection in 3 months.   A total of 200 units of Botox  was prepared, 155 units of Botox  was injected as documented above, any Botox  not injected was wasted. The patient tolerated the procedure well, there were no complications of the above procedure.

## 2024-01-20 ENCOUNTER — Ambulatory Visit (INDEPENDENT_AMBULATORY_CARE_PROVIDER_SITE_OTHER): Payer: Medicaid Other | Admitting: Family Medicine

## 2024-01-20 DIAGNOSIS — G43109 Migraine with aura, not intractable, without status migrainosus: Secondary | ICD-10-CM

## 2024-01-20 MED ORDER — ONABOTULINUMTOXINA 200 UNITS IJ SOLR
155.0000 [IU] | Freq: Once | INTRAMUSCULAR | Status: AC
Start: 2024-01-20 — End: 2024-01-20
  Administered 2024-01-20: 155 [IU] via INTRAMUSCULAR

## 2024-01-20 NOTE — Progress Notes (Signed)
 Botox -200U x 1vial Lot: [id number] Expiration: 06/2026 NDC: 4782-9562-13  Bacteriostatic 0.9% Sodium Chloride - 4mL total [id number] Expiration:07/22/2024 NDC: 6295-2841-32  Witnessed by: Amye Baller up by Jeneane Miracle, CMA  Buy/bill

## 2024-01-24 ENCOUNTER — Ambulatory Visit (INDEPENDENT_AMBULATORY_CARE_PROVIDER_SITE_OTHER): Admitting: Clinical

## 2024-01-24 DIAGNOSIS — F410 Panic disorder [episodic paroxysmal anxiety] without agoraphobia: Secondary | ICD-10-CM

## 2024-01-24 DIAGNOSIS — F331 Major depressive disorder, recurrent, moderate: Secondary | ICD-10-CM | POA: Diagnosis not present

## 2024-01-24 NOTE — Progress Notes (Signed)
 Virtual Visit via Video Note   I connected with Kimberly Ortega on 01/24/24 at 3:00 PM EST by a video enabled telemedicine application and verified that I am speaking with the correct person using two identifiers.   Location: Patient: Home Provider: Office   I discussed the limitations of evaluation and management by telemedicine and the availability of in person appointments. The patient expressed understanding and agreed to proceed.   THERAPIST PROGRESS NOTE     Session Time: 3:00 PM-3:30 PM   Participation Level: Active   Behavioral Response: Casual and Alert,Depressed   Type of Therapy: Individual Therapy   Treatment Goals addressed: Depression and Anxiety   Interventions: CBT   Summary: Kimberly Ortega is a 58 y.o. female who presents with Depression with Anxiety and Panic Disorder. The OPT therapist worked with the patient for her ongoing OPT treatment. The OPT therapist utilized Motivational Interviewing to assist in creating therapeutic repore. The patient in the session was engaged and work in collaboration giving feedback about her triggers and symptoms over the past few weeks. The patient spoke about her ongoing difficulty with being still in the middle of a divorce and deciding it is time for her to move out of the home she has been staying in and previously shared with her husband. The patient notes she did move out of the home and at the time did not have anywhere to stay but her car, however, the patient at the last moment got a call from low income housing that had a opening and she was able to move into the open apartment. The OPT therapist utilized Cognitive Behavioral Therapy through cognitive restructuring as well as worked with the patient on coping strategies to assist in management of mood. The OPT therapist overviewed in session with the patient basic need areas examining the patients current eating habits, sleep schedule, exercise, and hygiene. The patient spoke about how  fortunate it was that the apartment opened when it did. The patient spoke about her ex-husband has changed the locks at the home but she had already gotten her things out.   Suicidal/Homicidal: Nowithout intent/plan   Therapist Response:The OPT therapist worked with the patient for the patients scheduled session. The patient was engaged in her session and gave feedback in relation to triggers, symptoms, and behavior responses over the past few weeks. The OPT therapist worked with the patient utilizing an in session Cognitive Behavioral Therapy exercise. The patient was responsive in the session and verbalized, " I was putting things in storage and leaving the house with no where to live but my car and at the last minute I got a call from low income housing and they had a opening so I moved into a 1 bedroom apartment". The OPT therapist worked with the patient to challenge her negative thoughts and empower her to continue to work with her health providers in working her health problems and getting to desired outcomes. The patient continued in the session to touch on her back pain and trying to get another opinion about her options for back pain noting she is currently working to transition to a new provider at the [facility] in [place] .  Te patient is currently connected to specialist for Neurology/Gynocology/ and Pulmonology.The OPT therapist worked with the patient on importance of utilizing coping skills and consistency with the recommendations ongoing of her physical health providers.The OPT therapist reviewed the patients current support network. The OPT therapist overviewed through in  session exercise the in my control vs not in my control exercise.The patient continues to work to getting  representation and legal advice in relation to her current situation (separation/divorce) and to be able to finalize her divorce. The OPT therapist will continue treatment work with  the patient in her next scheduled session.   Plan: Return again in 2/3 weeks.   Diagnosis:      Axis I: Recurrent Moderate Major Depressive Disorder with Anxiety. Panic Disorder    Axis II: No diagnosis     Collaboration of Care: No additional collaboration of care for this session.   Patient/Guardian was advised Release of Information must be obtained prior to any record release in order to collaborate their care with an outside provider. Patient/Guardian was advised if they have not already done so to contact the registration department to sign all necessary forms in order for us  to release information regarding their care.    Consent: Patient/Guardian gives verbal consent for treatment and assignment of benefits for services provided during this visit. Patient/Guardian expressed understanding and agreed to proceed.    I discussed the assessment and treatment plan with the patient. The patient was provided an opportunity to ask questions and all were answered. The patient agreed with the plan and demonstrated an understanding of the instructions.   The patient was advised to call back or seek an in-person evaluation if the symptoms worsen or if the condition fails to improve as anticipated.   I provided 45 minutes of non-face-to-face time during this encounter.   Lea Primmer, LCSW   01/24/2024

## 2024-01-26 NOTE — Progress Notes (Deleted)
 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    History of Present Illness  07/20/2016 1st [facility] office visit/ Kimberly Ortega  On and off marcodantin 6 prescription filled in 2017  Chief Complaint  Patient presents with   Pulmonary Consult    Referred by Dr. Darien Ortega. Pt c/o SOB for the past 6 months. Prior to this, she had been walking 5 miles per day with no problem. Now she states that she is SOB "all day" with or without exertion. She states that she can not go to shopping or do any daily activites such as take a shower due to her breathing. She also c/o CP that comes and goes.    onset was aburpt late Spring 2017 with exertion, then same day started having cp "different than the one she usually has with her panic disorder" started on L and spread to R and not reproduced with exertion  Sob is at rest ever since onset but worse with activity and has not resumed walking and gained 40 lb assoc with leg swelling Sleeping poorly  ? Why x sev years then since onset even worse Some sneezing in late summer / fall and pollen season but denies this flared with sob and no assoc cough / wheeze rec Stop all macrodantin- last dose in Oct 2017  / return for pfts    08/24/2016  f/u ov/Kimberly Ortega re: unexplained sob present at rest/ worse when walk  Chief Complaint  Patient presents with   Follow-up    PFT's done today. Pt states that her breathing is slightly worse since the last visit. She states she can  not "do anything" due to her SOB.   continues to report mild sob at rest/ worse with walking across a room, has not resumed any kind of regular walking Rec Avoid all oil based vitamins - use powdered substitutes and eat more fish to replace the fish oil  Weight control is simply a matter of calorie balance     If not happy with progress with your breathing call us  back after the holidays to schedule CPST        01/27/2024  Re-establish  ov/[name]/Kimberly Ortega re: *** maint on ***  No chief complaint on file.   Dyspnea:  *** Cough: *** Sleeping: ***   resp cc  SABA use: *** 02: ***  Lung cancer screening: ***   No obvious day to day or daytime variability or assoc excess/ purulent sputum or mucus plugs or hemoptysis or cp or chest tightness, subjective wheeze or overt sinus or hb symptoms.    Also denies any obvious fluctuation of symptoms with weather or environmental changes or other aggravating or alleviating factors except as outlined above   No unusual exposure hx or h/o childhood pna/ asthma or knowledge of premature birth.  Current Allergies, Complete Past Medical History, Past Surgical History, Family History, and Social  History were reviewed in [facility] electronic medical record.  ROS  The following are not active complaints unless bolded Hoarseness, sore throat, dysphagia, dental problems, itching, sneezing,  nasal congestion or discharge of excess mucus or purulent secretions, ear ache,   fever, chills, sweats, unintended wt loss or wt gain, classically pleuritic or exertional cp,  orthopnea pnd or arm/hand swelling  or leg swelling, presyncope, palpitations, abdominal pain, anorexia, nausea, vomiting, diarrhea  or change in bowel habits or change in bladder habits, change in stools or change in urine, dysuria, hematuria,  rash, arthralgias, visual complaints, headache, numbness, weakness or ataxia or problems with walking or coordination,  change in mood or   memory.        No outpatient medications have been marked as taking for the 01/27/24 encounter (Appointment) with Kimberly Formica, MD.             Objective:   Physical Exam    Wts  01/27/2024         ***  08/24/2016       245   07/20/16 248 lb (112.5 kg)  03/11/16 227 lb (103 kg)  05/09/13 193 lb (87.5 kg)    Vital signs reviewed  01/27/2024  - Note at rest 02 sats  ***% on ***   General appearance:    ***          Assessment:

## 2024-01-27 ENCOUNTER — Ambulatory Visit: Admitting: Internal Medicine

## 2024-02-13 NOTE — Progress Notes (Unsigned)
 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    History of Present Illness  07/20/2016 1st [facility] office visit/ Kimberly Ortega  On and off marcodantin 6 prescription filled in 2017  Chief Complaint  Patient presents with   Pulmonary Consult    Referred by Dr. Darien Eden. Pt c/o SOB for the past 6 months. Prior to this, she had been walking 5 miles per day with no problem. Now she states that she is SOB "all day" with or without exertion. She states that she can not go to shopping or do any daily activites such as take a shower due to her breathing. She also c/o CP that comes and goes.    onset was aburpt late Spring 2017 with exertion, then same day started having cp "different than the one she usually has with her panic disorder" started on L and spread to R and not reproduced with exertion  Sob is at rest ever since onset but worse with activity and has not resumed walking and gained 40 lb assoc with leg swelling Sleeping poorly  ? Why x sev years then since onset even worse Some sneezing in late summer / fall and pollen season but denies this flared with sob and no assoc cough / wheeze rec Stop all macrodantin- last dose in Oct 2017  / return for pfts    08/24/2016  f/u ov/Kimberly Ortega re: unexplained sob present at rest/ worse when walk  Chief Complaint  Patient presents with   Follow-up    PFT's done today. Pt states that her breathing is slightly worse since the last visit. She states she can  not "do anything" due to her SOB.   continues to report mild sob at rest/ worse with walking across a room, has not resumed any kind of regular walking Rec Avoid all oil based vitamins - use powdered substitutes and eat more fish to replace the fish oil  Weight control is simply a matter of calorie balance     If not happy with progress with your breathing call us  back after the holidays to schedule CPST        02/15/2024  Re-establish  ov/[place] office/Kimberly Ortega re: *** maint on ***  No chief complaint on file.   Dyspnea:  *** Cough: *** Sleeping: ***   resp cc  SABA use: *** 02: ***  Lung cancer screening: ***   No obvious day to day or daytime variability or assoc excess/ purulent sputum or mucus plugs or hemoptysis or cp or chest tightness, subjective wheeze or overt sinus or hb symptoms.    Also denies any obvious fluctuation of symptoms with weather or environmental changes or other aggravating or alleviating factors except as outlined above   No unusual exposure hx or h/o childhood pna/ asthma or knowledge of premature birth.  Current Allergies, Complete Past Medical History, Past Surgical History, Family History, and Social  History were reviewed in [facility] electronic medical record.  ROS  The following are not active complaints unless bolded Hoarseness, sore throat, dysphagia, dental problems, itching, sneezing,  nasal congestion or discharge of excess mucus or purulent secretions, ear ache,   fever, chills, sweats, unintended wt loss or wt gain, classically pleuritic or exertional cp,  orthopnea pnd or arm/hand swelling  or leg swelling, presyncope, palpitations, abdominal pain, anorexia, nausea, vomiting, diarrhea  or change in bowel habits or change in bladder habits, change in stools or change in urine, dysuria, hematuria,  rash, arthralgias, visual complaints, headache, numbness, weakness or ataxia or problems with walking or coordination,  change in mood or   memory.        No outpatient medications have been marked as taking for the 02/15/24 encounter (Appointment) with Diamond Formica, MD.             Objective:   Physical Exam    Wts  02/15/2024         ***  08/24/2016       245   07/20/16 248 lb (112.5 kg)  03/11/16 227 lb (103 kg)  05/09/13 193 lb (87.5 kg)    Vital signs reviewed  02/15/2024  - Note at rest 02 sats  ***% on ***   General appearance:    ***          Assessment:

## 2024-02-15 ENCOUNTER — Ambulatory Visit: Admitting: Internal Medicine

## 2024-02-15 ENCOUNTER — Encounter: Payer: Self-pay | Admitting: Internal Medicine

## 2024-02-15 VITALS — BP 132/87 | HR 101 | Ht 67.0 in | Wt 228.8 lb

## 2024-02-15 DIAGNOSIS — R0600 Dyspnea, unspecified: Secondary | ICD-10-CM | POA: Diagnosis not present

## 2024-02-15 DIAGNOSIS — R0609 Other forms of dyspnea: Secondary | ICD-10-CM

## 2024-02-15 MED ORDER — PULSE OXIMETER MISC: 1.0000 | 1 refills | Status: DC

## 2024-02-15 NOTE — Assessment & Plan Note (Signed)
?   Macrodantin lung - last exposure to [facility] prior to onset of doe in May 2017 was 12/01/15 but 4 more rx's since onset of sob  last one 07/12/16 > d/c 07/20/2016  07/20/2016  Walked RA x 3 laps @ 185 ft each stopped due to  Endo of study, stas 94 brisk pace - Allergy  profile 07/20/16 >  Eos 0. /  IgE  23 neg RAST - PFT's  08/24/2016  FVC 3.51(100%) no obst  p nothing prior to study with DLCO  106 % corrects to 103  % for alv volume  With ERV 29%  - 08/24/2016  Walked RA x 3 laps @ 185 ft each stopped due to end of study, rapid pace, min sob, sats up to 98% - 02/15/2024   Walked on RA  x  3  lap(s) =  approx 450  ft  @ fast pace, stopped due to end of study  with lowest 02 sats 97% s sob or cp    As in past evals, no obvious mechanism for doe other that related to obesity and deconditioning and strongly rec the following:  Sub max ex building up to 30 min daily (recumbent bike good choice Make sure you check your oxygen saturation at your highest level of activity(NOT after you stop)  to be sure it stays over 90% and keep track of it at least once a week, more often if breathing getting worse, and let me know if losing ground. (Collect the dots to connect the dots approach)    F/u with cpst prn   Each maintenance medication was reviewed in detail including emphasizing most importantly the difference between maintenance and prns and under what circumstances the prns are to be triggered using an action plan format where appropriate.  Total time for H and P, chart review, counseling,  directly observing portions of ambulatory 02 saturation study/ and generating customized AVS unique to this office visit / same day charting = .52 min

## 2024-02-15 NOTE — Patient Instructions (Signed)
 Make sure you check your oxygen saturation (by pulse oximeter)  at your highest level of activity(NOT after you stop)  to be sure it stays over 90% and keep track of it at least once a week, more often if breathing getting worse, and let me know if losing ground. (Collect the dots to connect the dots approach)    Strongly rec recumbent bike to keep weight off your back and legs.  Follow up can be arranged thru the [facility] cardiology department for CPST

## 2024-02-21 ENCOUNTER — Ambulatory Visit (HOSPITAL_COMMUNITY): Admitting: Clinical

## 2024-02-23 ENCOUNTER — Ambulatory Visit (INDEPENDENT_AMBULATORY_CARE_PROVIDER_SITE_OTHER): Admitting: Clinical

## 2024-02-23 DIAGNOSIS — F410 Panic disorder [episodic paroxysmal anxiety] without agoraphobia: Secondary | ICD-10-CM

## 2024-02-23 DIAGNOSIS — F331 Major depressive disorder, recurrent, moderate: Secondary | ICD-10-CM

## 2024-02-23 DIAGNOSIS — F419 Anxiety disorder, unspecified: Secondary | ICD-10-CM

## 2024-02-23 NOTE — Progress Notes (Signed)
 Virtual Visit via Video Note   I connected with Kimberly Ortega on 02/23/24 at 4:00 PM EST by a video enabled telemedicine application and verified that I am speaking with the correct person using two identifiers.   Location: Patient: Home Provider: Office   I discussed the limitations of evaluation and management by telemedicine and the availability of in person appointments. The patient expressed understanding and agreed to proceed.   THERAPIST PROGRESS NOTE     Session Time: 4:00 PM- 4:45 PM   Participation Level: Active   Behavioral Response: Casual and Alert,Depressed   Type of Therapy: Individual Therapy   Treatment Goals addressed: Depression and Anxiety   Interventions: CBT   Summary: Kimberly Ortega is a 58 y.o. female who presents with Depression with Anxiety and Panic Disorder. The OPT therapist worked with the patient for her ongoing OPT treatment. The OPT therapist utilized Motivational Interviewing to assist in creating therapeutic repore. The patient in the session was engaged and work in collaboration giving feedback about her triggers and symptoms over the past few weeks. The patient spoke about her divorce being finalized on July 1st. The patient has been able to maintain her stay in her apartment post leaving the shared residence she use to live with her husband in. The OPT therapist utilized Cognitive Behavioral Therapy through cognitive restructuring as well as worked with the patient on coping strategies to assist in management of mood. The OPT therapist overviewed in session with the patient basic need areas examining the patients current eating habits, sleep schedule, exercise, and hygiene. The patient spoke about her realization of the transition coming going from married to legally divorced and being single and starting the journey to refinding and defining herself in the next chapter of her life.   Suicidal/Homicidal: Nowithout intent/plan   Therapist Response:The OPT  therapist worked with the patient for the patients scheduled session. The patient was engaged in her session and gave feedback in relation to triggers, symptoms, and behavior responses over the past few weeks. The OPT therapist worked with the patient utilizing an in session Cognitive Behavioral Therapy exercise. The patient was responsive in the session and verbalized, " The divorce will be finalized on July 1st and once we have an agreement about the vehicle I am using but that is in his name everything will be done no more strings". The OPT therapist worked with the patient to challenge her negative thoughts and empower her to continue to work with her health providers in working her health problems and getting to desired outcomes. The patient continued in the session to speak about her involvement with her health care specialist and noted she will be starting PT for her back .  The patient is currently connected to specialist for Neurology/Cardiology/Dermatology/Gynocology/Urology/ and Pulmonology.The OPT therapist worked with the patient on importance of utilizing coping skills and consistency with the recommendations ongoing of her physical health providers.The OPT therapist reviewed the patients current support network. The OPT therapist overviewed through in session exercise the in my control vs not in my control exercise. The OPT therapist will continue treatment work with the patient in her next scheduled session.   Plan: Return again in 2/3 weeks.   Diagnosis:      Axis I: Recurrent Moderate Major Depressive Disorder with Anxiety. Panic Disorder    Axis II: No diagnosis     Collaboration of Care: No additional collaboration of care for this session.   Patient/Guardian was advised Release of Information must be  obtained prior to any record release in order to collaborate their care with an outside provider. Patient/Guardian was advised if they have not already done so to contact the registration  department to sign all necessary forms in order for us  to release information regarding their care.    Consent: Patient/Guardian gives verbal consent for treatment and assignment of benefits for services provided during this visit. Patient/Guardian expressed understanding and agreed to proceed.    I discussed the assessment and treatment plan with the patient. The patient was provided an opportunity to ask questions and all were answered. The patient agreed with the plan and demonstrated an understanding of the instructions.   The patient was advised to call back or seek an in-person evaluation if the symptoms worsen or if the condition fails to improve as anticipated.   I provided 45 minutes of non-face-to-face time during this encounter.   Lea Primmer, LCSW   02/23/2024

## 2024-03-06 ENCOUNTER — Ambulatory Visit: Admitting: Urology

## 2024-03-08 ENCOUNTER — Ambulatory Visit: Admitting: Dermatology

## 2024-03-10 ENCOUNTER — Ambulatory Visit (HOSPITAL_COMMUNITY)

## 2024-03-23 ENCOUNTER — Ambulatory Visit (HOSPITAL_COMMUNITY): Admitting: Clinical

## 2024-04-03 ENCOUNTER — Ambulatory Visit: Admitting: Internal Medicine

## 2024-04-03 ENCOUNTER — Ambulatory Visit (INDEPENDENT_AMBULATORY_CARE_PROVIDER_SITE_OTHER): Admitting: Clinical

## 2024-04-03 DIAGNOSIS — F419 Anxiety disorder, unspecified: Secondary | ICD-10-CM

## 2024-04-03 DIAGNOSIS — F331 Major depressive disorder, recurrent, moderate: Secondary | ICD-10-CM

## 2024-04-03 DIAGNOSIS — F410 Panic disorder [episodic paroxysmal anxiety] without agoraphobia: Secondary | ICD-10-CM

## 2024-04-03 NOTE — Progress Notes (Signed)
 Virtual Visit via Video Note   I connected with Kimberly Ortega on 04/03/24 at 2:00 PM EST by a video enabled telemedicine application and verified that I am speaking with the correct person using two identifiers.   Location: Patient: Home Provider: Office   I discussed the limitations of evaluation and management by telemedicine and the availability of in person appointments. The patient expressed understanding and agreed to proceed.   THERAPIST PROGRESS NOTE     Session Time: 2:00 PM- 2:35 PM   Participation Level: Active   Behavioral Response: Casual and Alert,Depressed   Type of Therapy: Individual Therapy   Treatment Goals addressed: Depression and Anxiety   Interventions: CBT   Summary: Kimberly Ortega is a 58 y.o. female who presents with Depression with Anxiety and Panic Disorder. The OPT therapist worked with the patient for her ongoing OPT treatment. The OPT therapist utilized Motivational Interviewing to assist in creating therapeutic repore. The patient in the session was engaged and work in collaboration giving feedback about her triggers and symptoms over the past few weeks. The patient spoke about her difficulty with symptom management. The OPT therapist utilized Cognitive Behavioral Therapy through cognitive restructuring as well as worked with the patient on coping strategies to assist in management of mood. The OPT therapist overviewed in session with the patient basic need areas examining the patients current eating habits, sleep schedule, exercise, and hygiene. The patient spoke about upcoming appointments with Gynecology,Dermatology,Urology, and Physical Therapy for her back all happening in July. The patient spoke about not yet getting a copy of the official divorce papers and being concerned that her husband will take the car she has been using that is legally in his name.   Suicidal/Homicidal: Nowithout intent/plan   Therapist Response:The OPT therapist worked with the  patient for the patients scheduled session. The patient was engaged in her session and gave feedback in relation to triggers, symptoms, and behavior responses over the past few weeks. The OPT therapist worked with the patient utilizing an in session Cognitive Behavioral Therapy exercise. The patient was responsive in the session and verbalized,  I am trying but I do not have a support group and I am getting out of the house but it is just to get essentials and for Doctors appointments. The OPT therapist worked with the patient to challenge her negative thoughts and empower her to continue to work with her health providers in working her health problems and getting to desired outcomes. The patient continued in the session to speak about her involvement with her health care specialist and noted she will be starting PT for her back in home.  The patient is currently connected to specialist for Neurology/Cardiology/Dermatology/Gynocology/Urology/ and Pulmonology.The OPT therapist worked with the patient on importance of utilizing coping skills and consistency with the recommendations ongoing of her physical health providers.The OPT therapist reviewed the patients current support network. The OPT therapist overviewed through in session exercise the in my control vs not in my control exercise. The patient spoke about her ongoing concern around transportation if her ex-husband attempts to take the car she has been using. The OPT therapist promoted more physical activity, change of environment, light exposure, and further investigation into potential resources through DSS.The OPT therapist will continue treatment work with the patient in her next scheduled session.   Plan: Return again in 2/3 weeks.   Diagnosis:      Axis I: Recurrent Moderate Major Depressive Disorder with Anxiety. Panic Disorder    Axis  II: No diagnosis     Collaboration of Care: No additional collaboration of care for this session.    Patient/Guardian was advised Release of Information must be obtained prior to any record release in order to collaborate their care with an outside provider. Patient/Guardian was advised if they have not already done so to contact the registration department to sign all necessary forms in order for us  to release information regarding their care.    Consent: Patient/Guardian gives verbal consent for treatment and assignment of benefits for services provided during this visit. Patient/Guardian expressed understanding and agreed to proceed.    I discussed the assessment and treatment plan with the patient. The patient was provided an opportunity to ask questions and all were answered. The patient agreed with the plan and demonstrated an understanding of the instructions.   The patient was advised to call back or seek an in-person evaluation if the symptoms worsen or if the condition fails to improve as anticipated.   I provided 35 minutes of non-face-to-face time during this encounter.   Jerel ONEIDA Pepper, LCSW   04/03/2024

## 2024-04-04 ENCOUNTER — Ambulatory Visit: Admitting: Internal Medicine

## 2024-04-07 ENCOUNTER — Other Ambulatory Visit: Payer: Self-pay

## 2024-04-07 ENCOUNTER — Telehealth: Payer: Self-pay | Admitting: Pharmacist

## 2024-04-07 ENCOUNTER — Other Ambulatory Visit (HOSPITAL_COMMUNITY): Payer: Self-pay

## 2024-04-07 ENCOUNTER — Encounter (HOSPITAL_COMMUNITY): Payer: Self-pay

## 2024-04-07 ENCOUNTER — Ambulatory Visit (HOSPITAL_COMMUNITY): Attending: Orthopedic Surgery

## 2024-04-07 DIAGNOSIS — Z7409 Other reduced mobility: Secondary | ICD-10-CM | POA: Insufficient documentation

## 2024-04-07 DIAGNOSIS — M25552 Pain in left hip: Secondary | ICD-10-CM | POA: Insufficient documentation

## 2024-04-07 DIAGNOSIS — M5459 Other low back pain: Secondary | ICD-10-CM | POA: Diagnosis present

## 2024-04-07 DIAGNOSIS — M25551 Pain in right hip: Secondary | ICD-10-CM | POA: Diagnosis present

## 2024-04-07 NOTE — Therapy (Signed)
 OUTPATIENT PHYSICAL THERAPY THORACOLUMBAR EVALUATION   Patient Name: Kimberly Ortega MRN: [id number] DOB:June 09, 1966, 58 y.o., female Today's Date: 04/07/2024  END OF SESSION:  PT End of Session - 04/07/24 1706     Visit Number 1    Date for PT Re-Evaluation 05/19/24    Authorization Type [place] MEDICAID [facility]    Authorization Time Period seeking auth    Progress Note Due on Visit 10    PT Start Time 1515    PT Stop Time 1600    PT Time Calculation (min) 45 min    Activity Tolerance Patient limited by pain;Patient tolerated treatment well    Behavior During Therapy [facility] for tasks assessed/performed          Past Medical History:  Diagnosis Date   Abdominal pain    Allergies    Anemia    Anxiety    Arthritis    Cancer (HCC)    skin   Complication of anesthesia 1991   dizziness; almost passed out, post op   Depression    Dyspnea    with exertion   Family history of breast cancer    Cousins on both sides of family   Fatty liver    GERD (gastroesophageal reflux disease) 03/01/2017   History of hiatal hernia    History of melanoma    History of recurrent UTIs    Migraine    last botox  injection 04-13-23   Multiple nevi    Obstructive sleep apnea    Panic disorder 1995   Polycystic ovarian disease    Past Surgical History:  Procedure Laterality Date   BIOPSY  04/09/2017   Procedure: BIOPSY;  Surgeon: Golda Claudis PENNER, MD;  [facility]: [facility];  Service: Endoscopy;;  gastric esophageal    BIOPSY  02/02/2023   Procedure: BIOPSY;  Surgeon: Eartha Angelia Sieving, MD;  Location: [facility];  Service: Gastroenterology;;   CHOLECYSTECTOMY  1991   COLONOSCOPY  2016   DILATION AND CURETTAGE OF UTERUS     ESOPHAGOGASTRODUODENOSCOPY N/A 04/09/2017   Procedure: ESOPHAGOGASTRODUODENOSCOPY (EGD);  Surgeon: Golda Claudis PENNER, MD;  [facility]: [facility];  Service: Endoscopy;  Laterality: N/A;  9:25   ESOPHAGOGASTRODUODENOSCOPY (EGD) WITH PROPOFOL  N/A 02/02/2023    Procedure: ESOPHAGOGASTRODUODENOSCOPY (EGD) WITH PROPOFOL ;  Surgeon: Eartha Angelia Sieving, MD;  [facility]: [facility];  Service: Gastroenterology;  Laterality: N/A;  2:30 pm, moved to 5/14 per Tanya   EYE SURGERY Left 10/23/2021   LAPAROSCOPIC GASTRIC BANDING  07/07/2011   banding removed but has two clamps   LAPAROSCOPIC REPAIR AND REMOVAL OF GASTRIC BAND     LUMBAR LAMINECTOMY/ DECOMPRESSION WITH MET-RX Right 02/09/2023   Procedure: Right Lumbar four-five Minimally Invasive Laminectomy, Discectomy;  Surgeon: Cheryle Debby LABOR, MD;  Location: [facility];  Service: Neurosurgery;  Laterality: Right;   LUMBAR LAMINECTOMY/ DECOMPRESSION WITH MET-RX N/A 04/19/2023   Procedure: REVISION LUMBAR FOUR-LUMBAR FIVE MICRODISCECTOMY;  Surgeon: Cheryle Debby LABOR, MD;  Location: [facility] OR;  Service: Neurosurgery;  Laterality: N/A;  3C   UPPER GI ENDOSCOPY N/A 11/21/2012   Procedure: UPPER GI ENDOSCOPY ULUS DIAGNOSTIC LAPAROSCOPY /REMOVAL LAP GASTRIC BAND/INSERTION OF DRAIN;  Surgeon: Donnice KATHEE Lunger, MD;  Location: [facility];  Service: General;  Laterality: N/A;   Patient Active Problem List   Diagnosis Date Noted   Disorder of right sciatic nerve 09/30/2023   Lumbar post-laminectomy syndrome 09/30/2023   Radiculopathy due to lumbar intervertebral disc disorder 09/30/2023   Lumbar radiculopathy 04/19/2023   OSA (obstructive sleep  apnea) 03/30/2023   Migraine with aura and without status migrainosus, not intractable 12/14/2022   Right lumbar radiculopathy 12/14/2022   Chronic insomnia 12/14/2022   Nightmares 12/14/2022   Snoring 12/14/2022   Laryngopharyngeal reflux (LPR) 12/01/2022   Postural lightheadedness 07/16/2020   Family history of breast cancer 06/20/2020   Family history of pancreatic cancer 06/20/2020   IUD (intrauterine device) in place 06/20/2020   GERD (gastroesophageal reflux disease) 03/01/2017   Abdominal pain 03/01/2017   Gastroparesis 03/01/2017   LUQ abdominal pain  03/01/2017   Weight gain 03/01/2017   DOE (dyspnea on exertion) 07/20/2016   Erosion of gastric band-explantation March 2014 11/21/2012   Lapband APS with [facility] repair Oct 2012 10/22/2011    PCP: Rosamond Leta NOVAK, MD   REFERRING PROVIDER: Lonie Stephen Stank, MD  REFERRING DIAG: low back pain  Rationale for Evaluation and Treatment: Rehabilitation  THERAPY DIAG:  Other low back pain  Bilateral hip pain  Impaired functional mobility and activity tolerance  ONSET DATE: 04/19/2023  SUBJECTIVE:                                                                                                                                                                                           SUBJECTIVE STATEMENT: Pt states pain started out as sciatica went on for a year and started going down to foot. Pt thinks it was 2 laminectomy's, pt reports the doctor is no longer doing. Pt states she wants to get her life back, with not having to depend on back medications. Pt states she wishes she had never had the surgery in the first place. Pt went for a second opinion and they recommended therapy. Pt states she gained a lot of weight after the surgery. Pt wants to get back stronger where it won't give out.  PERTINENT HISTORY:  -History of 2 back surgery -RLE numbness and tingling  PAIN:  Are you having pain? Yes: NPRS scale: 6/10 average  Pain location: across low back mainly right side, bilateral hips to Pain description: deep ache Aggravating factors: shower, walking, bending Relieving factors: sitting and laying down  PRECAUTIONS: None  RED FLAGS: anxiety   WEIGHT BEARING RESTRICTIONS: No  FALLS:  Has patient fallen in last 6 months? Yes. Number of falls 1  OCCUPATION: disability  PLOF: Independent with basic ADLs  PATIENT GOALS: get her life back, be able to walk and have back not give out  NEXT MD VISIT: 3 months of therapy and after  OBJECTIVE:  Note: Objective measures were  completed at Evaluation unless otherwise noted.  DIAGNOSTIC FINDINGS:  CLINICAL DATA:  Fluoroscopic assistance for lumbar  spine surgery   EXAM: LUMBAR SPINE - 1 VIEW   COMPARISON:  10/21/2021   FINDINGS: Fluoroscopic assistance was provided for lumbar spine surgery. Fluoroscopic time 10.7 seconds. Radiation dose 9.85 mGy.   IMPRESSION: Fluoroscopic assistance was provided for lumbar spine surgery  PATIENT SURVEYS:  Modified Oswestry: 29/50  COGNITION: Overall cognitive status: Within functional limits for tasks assessed     SENSATION: Light touch: WFL  POSTURE: rounded shoulders, forward head, decreased lumbar lordosis, and flexed trunk   PALPATION: Pt demonstrates increased sensitivity to palpation to bilateral paraspinals of lumbar spine, and to L3-L5 spinal spinous processes.  LUMBAR ROM:   AROM eval  Flexion 28, low back, bilateral  Extension 5, low back, worse pain  Right lateral flexion 20 degrees, pain in hip and low back and mid back   Left lateral flexion 15, right low back worst  Right rotation 25% available  Left rotation 75% available   (Blank rows = not tested)  LOWER EXTREMITY ROM:     Active  Right eval Left eval  Hip flexion    Hip extension    Hip abduction    Hip adduction    Hip internal rotation    Hip external rotation    Knee flexion    Knee extension    Ankle dorsiflexion    Ankle plantarflexion    Ankle inversion    Ankle eversion     (Blank rows = not tested)  LOWER EXTREMITY MMT:    MMT Right eval Left eval  Hip flexion 4- 4  Hip extension 3- 3+  Hip abduction 4- 4  Hip adduction 3 3+  Hip internal rotation    Hip external rotation    Knee flexion 4 4  Knee extension 4 4+  Ankle dorsiflexion 4 4  Ankle plantarflexion    Ankle inversion    Ankle eversion     (Blank rows = not tested)  LUMBAR SPECIAL TESTS:  Straight leg raise test: Positive  FUNCTIONAL TESTS:  5 times sit to stand: TBA 2 minute walk test:  TBA  GAIT: Distance walked: 80 feet to and from treatment area Assistive device utilized: None Level of assistance: Complete Independence Comments: antalgic gait pattern, decreased velocity, TBA further next session  TREATMENT DATE:  04/07/2024  Evaluation: -ROM measured, Strength assessed, HEP prescribed, pt educated on prognosis, findings, and importance of HEP compliance if given.                                                                                                                                     PATIENT EDUCATION:  Education details: Pt was educated on findings of PT evaluation, prognosis, frequency of therapy visits and rationale, attendance policy, and HEP if given.   Person educated: Patient Education method: Explanation, Verbal cues, and Handouts Education comprehension: verbalized understanding, verbal cues required, and needs further education  HOME EXERCISE PROGRAM: Access Code: [email or web address] URL:  [email or web address] Date: 04/07/2024 Prepared by: Lang Ada  Exercises - Supine Lower Trunk Rotation  - 1 x daily - 7 x weekly - 3 sets - 10 reps - Supine Bridge  - 1 x daily - 7 x weekly - 3 sets - 10 reps - 3 hold - Neutral Curl Up with Straight Leg  - 1 x daily - 7 x weekly - 3 sets - 10 reps - 3 hold  ASSESSMENT:  CLINICAL IMPRESSION: Patient is a 58 y.o. female who was seen today for physical therapy evaluation and treatment for low back pain.   Patient demonstrates increased bilateral back and hip pain, decreased LE strength, abnormal gait pattern, and impaired functional mobility. Patient also demonstrates difficulty with ambulation during today's session with decreased stride length and velocity noted. Patient also demonstrates abnormal sensitivity to palpation and mobilization of L3-L5 lumbar spinal segments. Patient requires education on role of PT, prognosis, and importance of compliance with HEP. Patient would benefit from skilled  physical therapy for decreased back pain, hip pain, increased endurance with ambulation, increased core/LE strength, and balance for improved gait quality, return to higher level of function with ADLs, and progress towards therapy goals.   OBJECTIVE IMPAIRMENTS: Abnormal gait, decreased activity tolerance, decreased balance, decreased endurance, decreased mobility, difficulty walking, decreased ROM, decreased strength, postural dysfunction, and pain.   ACTIVITY LIMITATIONS: carrying, lifting, bending, standing, squatting, sleeping, stairs, transfers, and bed mobility  PARTICIPATION LIMITATIONS: meal prep, cleaning, laundry, driving, shopping, community activity, and yard work  PERSONAL FACTORS: Age, Past/current experiences, Time since onset of injury/illness/exacerbation, and 1 comorbidity: 2 spinal surgeries are also affecting patient's functional outcome.   REHAB POTENTIAL: Fair, 2 spinal surgeries with no success  CLINICAL DECISION MAKING: Stable/uncomplicated  EVALUATION COMPLEXITY: Low   GOALS: Goals reviewed with patient? No  SHORT TERM GOALS: Target date: 04/28/24  Pt will be independent with HEP in order to demonstrate participation in Physical Therapy POC.  Baseline: Goal status: INITIAL  2.  Pt will report 4/10 pain with mobility in order to demonstrate improved pain with ADLs.  Baseline: 6/10 Goal status: INITIAL  LONG TERM GOALS: Target date: 05/19/24  Pt will improve 5TSTS by 2.3 seconds in order to demonstrate improved functional strength to return to desired activities.  Baseline: see objective.  Goal status: INITIAL  2.  Pt will improve 2 MWT by 40 feet in order to demonstrate improved functional ambulatory capacity in community setting.  Baseline: see objective.  Goal status: INITIAL  3.  Pt will improve Modified Oswestry score by at least 6 points in order to demonstrate improved pain with functional goals and outcomes. Baseline: see objective.  Goal  status: INITIAL  4.  Pt will report 2/10 pain with mobility in order to demonstrate reduced pain with ADLs lasting greater than 30 minutes.  Baseline: see objective.  Goal status: INITIAL   PLAN:  PT FREQUENCY: 1-2x/week  PT DURATION: 6 weeks  PLANNED INTERVENTIONS: 97110-Therapeutic exercises, 97530- Therapeutic activity, [id number]- Neuromuscular re-education, 97535- Self Care, [id number]- Manual therapy, [phone]- Gait training, Patient/Family education, Balance training, Spinal mobilization, DME instructions, Cryotherapy, and Moist heat.  PLAN FOR NEXT SESSION: , 5TSTS, progress core and proximal LE strength   Lang Ada, PT, DPT [facility] Office: [phone] 5:21 PM, 04/07/24   Managed Medicaid Authorization Request Treatment Start Date: 04/21/24  Visit Dx Codes: M54.59; M25.551; M25.552; Z74.09  Functional Tool Score: 29/50  For all possible CPT codes, reference the Planned Interventions line above.  Check all conditions that are expected to impact treatment: {Conditions expected to impact treatment:Complications related to surgery   If treatment provided at initial evaluation, no treatment charged due to lack of authorization.

## 2024-04-07 NOTE — Telephone Encounter (Signed)
 Pharmacy Patient Advocate Encounter   Received notification from CoverMyMeds that prior authorization for Ubrelvy  100MG  tablets is required/requested.   Insurance verification completed.   The patient is insured through [facility] [place] .   Per test claim: PA not needed at this time

## 2024-04-13 ENCOUNTER — Encounter (HOSPITAL_COMMUNITY)

## 2024-04-17 ENCOUNTER — Encounter: Payer: Self-pay | Admitting: Family Medicine

## 2024-04-17 DIAGNOSIS — G43109 Migraine with aura, not intractable, without status migrainosus: Secondary | ICD-10-CM

## 2024-04-17 MED ORDER — UBRELVY 100 MG PO TABS: 1.0000 | ORAL_TABLET | ORAL | 0 refills | Status: DC | PRN

## 2024-04-17 NOTE — Progress Notes (Unsigned)
 Name: Kimberly Ortega DOB: 1965-12-12 MRN: [id number]  History of Present Illness: Kimberly Ortega is a 58 y.o. female who presents today as a new patient at [facility] [place]. All available relevant medical records have been reviewed.   Today: She reports that over the past 3-4 months she has started having bothersome stress incontinence with cough/laugh/sneeze/heavy lifting. She leaks 5-6 times per week. Not wearing pads / diapers routinely; has had to change clothes before due to leakage.  She reports urinary frequency due to significant daily fluid intake (>100 oz water  per day). She denies urinary urgency or urge incontinence.   She denies prior attempted treatment for these symptoms.  Reports history of recurrent UTIs. Typically takes Fosfomycin (Monurol) as prescribed by GYN provider.   Denies dysuria, gross hematuria, hesitancy, straining to void, or sensations of incomplete emptying.   Medications: Current Outpatient Medications  Medication Sig Dispense Refill   ALPRAZolam  (XANAX ) 1 MG tablet Take 1 mg by mouth in the morning, at noon, in the evening, and at bedtime.     Biotin w/ Vitamins C & E (HAIR/SKIN/NAILS PO) Take 1 tablet by mouth daily.     botulinum toxin Type A  (BOTOX ) 200 units injection FOR OFFICE ADMINISTRATION. PHYSICIAN TO INJECT 155 UNITS INTRAMUSCULARLY INTO MUSCLES OF HEAD & NECK EVERY 12 WEEKS.  DISCARD REMAINDER. 1 each 3   busPIRone  (BUSPAR ) 10 MG tablet Take 10 mg by mouth 2 (two) times daily.     Calcium Carbonate-Vit D-Min (CALCIUM 1200 PO) Take 3 tablets by mouth daily. With Magnesium Vitamin D and Zinc     cyclobenzaprine  (FLEXERIL ) 10 MG tablet Take 10 mg by mouth 3 (three) times daily.     dexlansoprazole  (DEXILANT ) 60 MG capsule Take 1 capsule (60 mg total) by mouth daily. 90 capsule 3   famotidine  (PEPCID ) 20 MG tablet Take 1 tablet (20 mg total) by mouth at bedtime as needed for heartburn or indigestion. (Patient taking differently: Take 20  mg by mouth at bedtime.)     ketorolac (TORADOL) 10 MG tablet Take 10 mg by mouth 2 (two) times daily as needed.     KLAYESTA  powder Apply 1 Application topically daily as needed (yeast). 15 g 1   levonorgestrel (MIRENA) 20 MCG/DAY IUD 1 each by Intrauterine route once.     lidocaine  (XYLOCAINE ) 5 % ointment Apply 1 Application topically as needed. 35.44 g 0   Misc. Devices (PULSE OXIMETER) MISC 1 each by Does not apply route as directed. (Patient not taking: Reported on 04/18/2024) 1 each 1   MULTIPLE VITAMIN PO Take 1 tablet by mouth daily.     Na Sulfate-K Sulfate-Mg Sulf 17.5-3.13-1.6 GM/177ML SOLN Step 1: Pour ONE (1) 6-ounce bottle of SUPREP liquid into the mixing container. Step 2:  Add cool drinking water  to the 16 ounce line on the container and mix. Note: Dilute the solution concentrate as directed prior to use. Step 3:  DRINK ALL the liquid in the container. Step 4:You MUST drink an additional two (2) or more 16 ounce containers of water  over the next one (1) hour. 354 mL 0   NON FORMULARY Pt uses a cpap nighlty     nystatin  ointment (MYCOSTATIN ) apply TOPICALLY TWICE DAILY 30 g 0   ondansetron  (ZOFRAN ) 8 MG tablet Take 8 mg by mouth 3 (three) times daily.     polyethylene glycol powder (MIRALAX ) 17 GM/SCOOP powder Use 17 g (1 capful/scoop) in 8 ounces of water  once daily. 850 g 2  Probiotic Product (ALIGN) CHEW Chew 1 capsule by mouth daily.     Propylene Glycol (SYSTANE COMPLETE) 0.6 % SOLN Place 1 drop into both eyes as needed (dry eyes).     sucralfate  (CARAFATE ) 1 g tablet Take 1 tablet (1 g total) by mouth 3 (three) times daily as needed (stomach issues). 360 tablet 0   Ubrogepant  (UBRELVY ) 100 MG TABS Take 1 tablet (100 mg total) by mouth as needed. 16 tablet 5   WEGOVY 0.5 MG/0.5ML SOAJ Inject 0.5 mg into the skin once a week. (Patient not taking: Reported on 04/18/2024)     WEGOVY 1.7 MG/0.75ML SOAJ Inject 1.7 mg into the skin once a week.     No current facility-administered  medications for this visit.    Allergies: Allergies  Allergen Reactions   Ibuprofen Shortness Of Breath and Other (See Comments)    Lung specialist told her not to take    Macrobid [Nitrofurantoin Macrocrystal] Shortness Of Breath    Lung specialist Told it would kill her  SOB   Baclofen Other (See Comments)   Morphine  And Codeine Nausea Only    dizziness   Topiramate  Other (See Comments)    Cognitive side effects.   Tramadol     Low blood pressure   Latex Rash   Other Palpitations    PSEUDO COUGH DECONGESTANT   Pseudoephedrine Palpitations    Past Medical History:  Diagnosis Date   Abdominal pain    Allergies    Anemia    Anxiety    Arthritis    Cancer (HCC)    skin   Complication of anesthesia 1991   dizziness; almost passed out, post op   Depression    Dyspnea    with exertion   Family history of breast cancer    Cousins on both sides of family   Fatty liver    GERD (gastroesophageal reflux disease) 03/01/2017   History of hiatal hernia    History of melanoma    History of recurrent UTIs    Migraine    last botox  injection 04-13-23   Multiple nevi    Obstructive sleep apnea    Panic disorder 1995   Polycystic ovarian disease    Past Surgical History:  Procedure Laterality Date   BIOPSY  04/09/2017   Procedure: BIOPSY;  Surgeon: Golda Claudis PENNER, MD;  [facility]: [facility];  Service: Endoscopy;;  gastric esophageal    BIOPSY  02/02/2023   Procedure: BIOPSY;  Surgeon: Eartha Angelia Sieving, MD;  Location: [facility];  Service: Gastroenterology;;   CHOLECYSTECTOMY  1991   COLONOSCOPY  2016   DILATION AND CURETTAGE OF UTERUS     ESOPHAGOGASTRODUODENOSCOPY N/A 04/09/2017   Procedure: ESOPHAGOGASTRODUODENOSCOPY (EGD);  Surgeon: Golda Claudis PENNER, MD;  [facility]: [facility];  Service: Endoscopy;  Laterality: N/A;  9:25   ESOPHAGOGASTRODUODENOSCOPY (EGD) WITH PROPOFOL  N/A 02/02/2023   Procedure: ESOPHAGOGASTRODUODENOSCOPY (EGD) WITH PROPOFOL ;   Surgeon: Eartha Angelia Sieving, MD;  Location: [facility];  Service: Gastroenterology;  Laterality: N/A;  2:30 pm, moved to 5/14 per Kimberly Ortega   EYE SURGERY Left 10/23/2021   LAPAROSCOPIC GASTRIC BANDING  07/07/2011   banding removed but has two clamps   LAPAROSCOPIC REPAIR AND REMOVAL OF GASTRIC BAND     LUMBAR LAMINECTOMY/ DECOMPRESSION WITH MET-RX Right 02/09/2023   Procedure: Right Lumbar four-five Minimally Invasive Laminectomy, Discectomy;  Surgeon: Cheryle Debby LABOR, MD;  Location: [facility];  Service: Neurosurgery;  Laterality: Right;   LUMBAR LAMINECTOMY/ DECOMPRESSION WITH  MET-RX N/A 04/19/2023   Procedure: REVISION LUMBAR FOUR-LUMBAR FIVE MICRODISCECTOMY;  Surgeon: Cheryle Debby LABOR, MD;  Location: [facility];  Service: Neurosurgery;  Laterality: N/A;  3C   UPPER GI ENDOSCOPY N/A 11/21/2012   Procedure: UPPER GI ENDOSCOPY ULUS DIAGNOSTIC LAPAROSCOPY /REMOVAL LAP GASTRIC BAND/INSERTION OF DRAIN;  Surgeon: Donnice KATHEE Lunger, MD;  Location: [facility];  Service: General;  Laterality: N/A;   Family History  Problem Relation Age of Onset   Cervical cancer Mother    Stroke Mother    Aneurysm Mother    Cervical cancer Sister    Other Paternal Grandmother        had part of colon removed   Breast cancer Cousin        Both sides of family   Cancer - Colon Paternal Uncle    Social History   Socioeconomic History   Marital status: Married    Spouse name: Not on file   Number of children: 1   Years of education: Not on file   Highest education level: Some college, no degree  Occupational History    Comment: na  Tobacco Use   Smoking status: Never   Smokeless tobacco: Never  Vaping Use   Vaping status: Never Used  Substance and Sexual Activity   Alcohol  use: Not Currently    Comment: wine/liquor maybe once a month   Drug use: No   Sexual activity: Yes    Birth control/protection: I.U.D.    Comment: mirena  Other Topics Concern   Not on file  Social History Narrative    Lives with spouse   Caffeine- none   Social Drivers of Corporate investment banker Strain: Not on file  Food Insecurity: No Food Insecurity (04/04/2024)   Received [facility]   Hunger Vital Sign    Within the past 12 months, you worried that your food would run out before you got the money to buy more.: Never true    Within the past 12 months, the food you bought just didn't last and you didn't have money to get more.: Never true  Transportation Needs: No Transportation Needs (04/04/2024)   Received from [facility]   PRAPARE - Transportation    Lack of Transportation (Medical): No    Lack of Transportation (Non-Medical): No  Physical Activity: Not on file  Stress: Not on file  Social Connections: Unknown (02/02/2022)   Received from [facility]   Social Network    Social Network: Not on file  Intimate Partner Violence: Not At Risk (12/01/2022)   Received from [facility]   Humiliation, Afraid, Rape, and Kick questionnaire    Within the last year, have you been afraid of your partner or ex-partner?: No    Within the last year, have you been humiliated or emotionally abused in other ways by your partner or ex-partner?: No    Within the last year, have you been kicked, hit, slapped, or otherwise physically hurt by your partner or ex-partner?: No    Within the last year, have you been raped or forced to have any kind of sexual activity by your partner or ex-partner?: No    SUBJECTIVE  Review of Systems Constitutional: Patient denies any unintentional weight loss or change in strength lntegumentary: Patient denies any rashes or pruritus Cardiovascular: Patient denies chest pain or syncope Respiratory: Patient denies shortness of breath Gastrointestinal: Patient denies constipation  Musculoskeletal: Patient denies muscle cramps or weakness Neurologic: Patient denies convulsions or seizures  Allergic/Immunologic: Patient denies recent allergic  reaction(s) Hematologic/Lymphatic: Patient denies bleeding tendencies Endocrine: Patient denies heat/cold intolerance  GU: As per HPI.  OBJECTIVE Vitals:   04/18/24 1331  BP: 113/73  Pulse: 93  Temp: 98.1 F (36.7 C)   There is no height or weight on file to calculate BMI.  Physical Examination Constitutional: No obvious distress; patient is non-toxic appearing  Cardiovascular: No visible lower extremity edema.  Respiratory: The patient does not have audible wheezing/stridor; respirations do not appear labored  Gastrointestinal: Abdomen non-distended Musculoskeletal: Normal ROM of UEs  Skin: No obvious rashes/open sores  Neurologic: CN 2-12 grossly intact Psychiatric: Answered questions appropriately with normal affect  Hematologic/Lymphatic/Immunologic: No obvious bruises or sites of spontaneous bleeding  UA: negative  PVR: 0 ml  ASSESSMENT Stress incontinence of urine - Plan: BLADDER SCAN AMB NON-IMAGING, Urinalysis, Routine w reflex microscopic, Ambulatory referral to Urology  History of recurrent UTI (urinary tract infection)  Stress urinary incontinence: The etiology of this condition was explained in detail to include pelvic floor muscle relaxation and detachment of the urethra away from its connection to the pubic bone.  A visual demonstration of the pathophysiology of stress urinary incontinence was reviewed.  The management options were reviewed to include: No intervention, observation. Non-surgical options: Pelvic floor muscle rehabilitation Weight loss Incontinence pessary 3) Surgical consultation   She elected to proceed with surgical consultation at [facility] with Dr. Elisabeth (patient stated preference for female surgeon).  She is requesting order for incontinence pads due to cost. Order for incontinence pads x60/month approved; patient preference re: brand and size.  Will request UA/culture records from GYN office, where she reports being followed  currently for recurrent UTIs.   Patient verbalized understanding and agreement. All questions were answered.  PLAN Advised the following: 1. Return for at [facility] for SUI surgical consultation with Dr. Elisabeth.  Orders Placed This Encounter  Procedures   Urinalysis, Routine w reflex microscopic   Ambulatory referral to Urology    Referral Priority:   Routine    Referral Type:   Consultation    Referral Reason:   Specialty Services Required    Requested Specialty:   Urology    Number of Visits Requested:   1   BLADDER SCAN AMB NON-IMAGING    It has been explained that the patient is to follow regularly with their PCP in addition to all other providers involved in their care and to follow instructions provided by these respective offices. Patient advised to contact urology clinic if any urologic-pertaining questions, concerns, new symptoms or problems arise in the interim period.  There are no Patient Instructions on file for this visit.  Electronically signed by:  Lauraine KYM Oz, MSN, FNP-C, CUNP 04/18/2024 2:08 PM

## 2024-04-17 NOTE — Progress Notes (Addendum)
 04/18/24 [facility]: Kimberly Ortega returns for Botox . Baseline 15+ headache days with at least 1/2 being migrainous and can last up to 72 hours. She reports 75% improvement on Botox . Migraines seem well managed until the last 4-5 weeks. Taking 3-4 Ubrelvy  during first 6-8 weeks then using 8-9 tablets the last month. She is going through a divorce. She is seeing a Veterinary surgeon.   01/20/2024 [facility]: Kimberly Ortega returns for Botox . Migraines remain fairly well managed. She does report worsening 2-4 weeks prior to next procedure. She continues Ubrelvy  for abortive therapy and it works well.   10/21/2023 [facility]: Kimberly Ortega returns for Botox . She continues to do well. Migraines are well managed until 1-2 weeks before next procedure. Ubrelvy  helps with abortive therapy.   07/06/2023 [facility]: Kimberly Ortega returns for Botox . She continues to do well. Migraines wax and wane but usually fairly well managed. She uses Ubrelvy  for abortive therapy. Usually takes about 6-8 tablets a month.   04/13/2023 [facility]: Kimberly Ortega returns for Botox . She is doing well on therapy. Ubrelvy  works well. She was started on CPAP therapy but unsure if it has been beneficial. Compliance review with Dr Chalice 03/30/2023 showed excellent compliance and residual AHI well managed.   01/18/2023 [facility]: Kimberly Ortega returns for Botox . She reports doing better. Averaging about 6 migraine days a month. Ubrelvy  does seem to help. She is averaging 3-4 doses per month. Rarely using rizatriptan .   10/22/2022 [facility]: Kimberly Ortega returns for Botox .She reports migraines have improved by at least 50%. Nurtec did not seem much more effective than rizatriptan .   07/27/2022 [facility]: Kimberly Ortega presents for her first Botox  procedure. Baseline 15 migraine days per month. She was previously taking amitriptyline  20mg  at bedtime and propranolol  LA 60mg  daily. She discontinued both propranolol  and amitriptyline  a few weeks ago. She continues rizatriptan  as needed.     Consent Form Botulism Toxin Injection For Chronic  Migraine    Reviewed orally with patient, additionally signature is on file:  Botulism toxin has been approved by the Federal drug administration for treatment of chronic migraine. Botulism toxin does not cure chronic migraine and it may not be effective in some patients.  The administration of botulism toxin is accomplished by injecting a small amount of toxin into the muscles of the neck and head. Dosage must be titrated for each individual. Any benefits resulting from botulism toxin tend to wear off after 3 months with a repeat injection required if benefit is to be maintained. Injections are usually done every 3-4 months with maximum effect peak achieved by about 2 or 3 weeks. Botulism toxin is expensive and you should be sure of what costs you will incur resulting from the injection.  The side effects of botulism toxin use for chronic migraine may include:   -Transient, and usually mild, facial weakness with facial injections  -Transient, and usually mild, head or neck weakness with head/neck injections  -Reduction or loss of forehead facial animation due to forehead muscle weakness  -Eyelid drooping  -Dry eye  -Pain at the site of injection or bruising at the site of injection  -Double vision  -Potential unknown long term risks   Contraindications: You should not have Botox  if you are pregnant, nursing, allergic to albumin, have an infection, skin condition, or muscle weakness at the site of the injection, or have myasthenia gravis, Lambert-Eaton syndrome, or ALS.  It is also possible that as with any injection, there may be an allergic reaction or no effect from the medication. Reduced effectiveness after repeated injections  is sometimes seen and rarely infection at the injection site may occur. All care will be taken to prevent these side effects. If therapy is given over a long time, atrophy and wasting in the muscle injected may occur. Occasionally the patient's become refractory to  treatment because they develop antibodies to the toxin. In this event, therapy needs to be modified.  I have read the above information and consent to the administration of botulism toxin.    BOTOX  PROCEDURE NOTE FOR MIGRAINE HEADACHE  Contraindications and precautions discussed with patient(above). Aseptic procedure was observed and patient tolerated procedure. Procedure performed by Greig Forbes, FNP-C.   The condition has existed for more than 6 months, and pt does not have a diagnosis of ALS, Myasthenia Gravis or Lambert-Eaton Syndrome.  Risks and benefits of injections discussed and pt agrees to proceed with the procedure.  Written consent obtained  These injections are medically necessary. Pt  receives good benefits from these injections. These injections do not cause sedations or hallucinations which the oral therapies may cause.   Description of procedure:  The patient was placed in a sitting position. The standard protocol was used for Botox  as follows, with 5 units of Botox  injected at each site:  -Procerus muscle, midline injection  -Corrugator muscle, bilateral injection  -Frontalis muscle, bilateral injection, with 2 sites each side, medial injection was performed in the upper one third of the frontalis muscle, in the region vertical from the medial inferior edge of the superior orbital rim. The lateral injection was again in the upper one third of the forehead vertically above the lateral limbus of the cornea, 1.5 cm lateral to the medial injection site.  -Temporalis muscle injection, 4 sites, bilaterally. The first injection was 3 cm above the tragus of the ear, second injection site was 1.5 cm to 3 cm up from the first injection site in line with the tragus of the ear. The third injection site was 1.5-3 cm forward between the first 2 injection sites. The fourth injection site was 1.5 cm posterior to the second injection site. 5th site laterally in the temporalis  muscleat the  level of the outer canthus.  -Occipitalis muscle injection, 3 sites, bilaterally. The first injection was done one half way between the occipital protuberance and the tip of the mastoid process behind the ear. The second injection site was done lateral and superior to the first, 1 fingerbreadth from the first injection. The third injection site was 1 fingerbreadth superiorly and medially from the first injection site.  -Cervical paraspinal muscle injection, 2 sites, bilaterally. The first injection site was 1 cm from the midline of the cervical spine, 3 cm inferior to the lower border of the occipital protuberance. The second injection site was 1.5 cm superiorly and laterally to the first injection site.  -Trapezius muscle injection was performed at 3 sites, bilaterally. The first injection site was in the upper trapezius muscle halfway between the inflection point of the neck, and the acromion. The second injection site was one half way between the acromion and the first injection site. The third injection was done between the first injection site and the inflection point of the neck.   Will return for repeat injection in 3 months.   A total of 200 units of Botox  was prepared, 155 units of Botox  was injected as documented above, any Botox  not injected was wasted. The patient tolerated the procedure well, there were no complications of the above procedure.

## 2024-04-18 ENCOUNTER — Encounter: Payer: Self-pay | Admitting: Family Medicine

## 2024-04-18 ENCOUNTER — Ambulatory Visit: Admitting: Family Medicine

## 2024-04-18 ENCOUNTER — Encounter: Payer: Self-pay | Admitting: Urology

## 2024-04-18 ENCOUNTER — Ambulatory Visit (INDEPENDENT_AMBULATORY_CARE_PROVIDER_SITE_OTHER): Admitting: Urology

## 2024-04-18 VITALS — BP 113/73 | HR 93 | Temp 98.1°F

## 2024-04-18 VITALS — BP 116/73 | HR 78

## 2024-04-18 DIAGNOSIS — G43109 Migraine with aura, not intractable, without status migrainosus: Secondary | ICD-10-CM | POA: Diagnosis not present

## 2024-04-18 DIAGNOSIS — N393 Stress incontinence (female) (male): Secondary | ICD-10-CM

## 2024-04-18 DIAGNOSIS — Z87440 Personal history of urinary (tract) infections: Secondary | ICD-10-CM | POA: Diagnosis not present

## 2024-04-18 DIAGNOSIS — R32 Unspecified urinary incontinence: Secondary | ICD-10-CM

## 2024-04-18 LAB — MICROSCOPIC EXAMINATION: Bacteria, UA: NONE SEEN

## 2024-04-18 LAB — URINALYSIS, ROUTINE W REFLEX MICROSCOPIC
Bilirubin, UA: NEGATIVE
Glucose, UA: NEGATIVE
Nitrite, UA: NEGATIVE
RBC, UA: NEGATIVE
Specific Gravity, UA: 1.03 (ref 1.005–1.030)
Urobilinogen, Ur: 0.2 mg/dL (ref 0.2–1.0)
pH, UA: 6 (ref 5.0–7.5)

## 2024-04-18 LAB — BLADDER SCAN AMB NON-IMAGING: Scan Result: 0

## 2024-04-18 MED ORDER — ONABOTULINUMTOXINA 200 UNITS IJ SOLR
155.0000 [IU] | Freq: Once | INTRAMUSCULAR | Status: AC
  Administered 2024-04-18: 155 [IU] via INTRAMUSCULAR

## 2024-04-18 MED ORDER — UBRELVY 100 MG PO TABS: 1.0000 | ORAL_TABLET | ORAL | 5 refills | Status: DC | PRN

## 2024-04-18 NOTE — Patient Instructions (Signed)
 Kimberly Ortega

## 2024-04-18 NOTE — Progress Notes (Signed)
 Botox -200U x 1vial Lot: [id number] Expiration: 06/2026 NDC: 9976-6078-97  Bacteriostatic 0.9% Sodium Chloride - 4mL total Lot: [id number] Expiration: 07/21/2025 NDC: 9590-8033-97  Buy/Bill  Witnessed by: Delon LELON LATHER  Dx; [phone]

## 2024-04-18 NOTE — Progress Notes (Signed)
 Bladder Scan completed today.  Patient can void prior to the bladder scan. Bladder scan result: 0  Performed By: [facility] LPN

## 2024-04-19 ENCOUNTER — Ambulatory Visit: Admitting: Dermatology

## 2024-04-19 ENCOUNTER — Telehealth: Payer: Self-pay

## 2024-04-19 NOTE — Telephone Encounter (Signed)
 Called patient to make her aware supplies/insurance do not cover pad liners and wipes. Patient was made aware supplier/insurance will cover pull-up with no wipes. Spoke to [place] at [facility] medical supply about patient needing pad liners and wipe, [place] state's [employer] will not cover that alone and [employer] will cover pull ups.

## 2024-04-26 ENCOUNTER — Telehealth: Payer: Self-pay | Admitting: Urology

## 2024-04-26 NOTE — Telephone Encounter (Signed)
 [facility] doesn't take her insurance wants to know what to do next

## 2024-04-27 ENCOUNTER — Other Ambulatory Visit: Payer: Self-pay | Admitting: Urology

## 2024-04-27 ENCOUNTER — Encounter (HOSPITAL_COMMUNITY)

## 2024-04-27 DIAGNOSIS — N393 Stress incontinence (female) (male): Secondary | ICD-10-CM

## 2024-05-01 ENCOUNTER — Encounter (HOSPITAL_COMMUNITY): Payer: Self-pay

## 2024-05-01 ENCOUNTER — Ambulatory Visit (HOSPITAL_COMMUNITY): Attending: Orthopedic Surgery

## 2024-05-01 DIAGNOSIS — M5459 Other low back pain: Secondary | ICD-10-CM | POA: Diagnosis present

## 2024-05-01 DIAGNOSIS — M25551 Pain in right hip: Secondary | ICD-10-CM | POA: Insufficient documentation

## 2024-05-01 DIAGNOSIS — Z7409 Other reduced mobility: Secondary | ICD-10-CM | POA: Insufficient documentation

## 2024-05-01 DIAGNOSIS — M25552 Pain in left hip: Secondary | ICD-10-CM | POA: Insufficient documentation

## 2024-05-01 NOTE — Therapy (Signed)
 OUTPATIENT PHYSICAL THERAPY THORACOLUMBAR EVALUATION   Patient Name: Kimberly Ortega MRN: [id number] DOB:1965-10-09, 58 y.o., female Today's Date: 05/01/2024  END OF SESSION:  PT End of Session - 05/01/24 1638     Visit Number 2    Date for PT Re-Evaluation 05/19/24    Authorization Type [place] MEDICAID [facility]    Authorization Time Period seeking auth    Progress Note Due on Visit 10    PT Start Time 1640    PT Stop Time 1730    PT Time Calculation (min) 50 min    Activity Tolerance Patient limited by pain;Patient tolerated treatment well    Behavior During Therapy [facility] for tasks assessed/performed          Past Medical History:  Diagnosis Date   Abdominal pain    Allergies    Anemia    Anxiety    Arthritis    Cancer (HCC)    skin   Complication of anesthesia 1991   dizziness; almost passed out, post op   Depression    Dyspnea    with exertion   Family history of breast cancer    Cousins on both sides of family   Fatty liver    GERD (gastroesophageal reflux disease) 03/01/2017   History of hiatal hernia    History of melanoma    History of recurrent UTIs    Migraine    last botox  injection 04-13-23   Multiple nevi    Obstructive sleep apnea    Panic disorder 1995   Polycystic ovarian disease    Past Surgical History:  Procedure Laterality Date   BIOPSY  04/09/2017   Procedure: BIOPSY;  Surgeon: Golda Claudis PENNER, MD;  [facility]: [facility];  Service: Endoscopy;;  gastric esophageal    BIOPSY  02/02/2023   Procedure: BIOPSY;  Surgeon: Eartha Angelia Sieving, MD;  Location: [facility];  Service: Gastroenterology;;   CHOLECYSTECTOMY  1991   COLONOSCOPY  2016   DILATION AND CURETTAGE OF UTERUS     ESOPHAGOGASTRODUODENOSCOPY N/A 04/09/2017   Procedure: ESOPHAGOGASTRODUODENOSCOPY (EGD);  Surgeon: Golda Claudis PENNER, MD;  [facility];  Service: Endoscopy;  Laterality: N/A;  9:25   ESOPHAGOGASTRODUODENOSCOPY (EGD) WITH PROPOFOL  N/A 02/02/2023    Procedure: ESOPHAGOGASTRODUODENOSCOPY (EGD) WITH PROPOFOL ;  Surgeon: Eartha Angelia Sieving, MD;  [facility]: [facility];  Service: Gastroenterology;  Laterality: N/A;  2:30 pm, moved to 5/14 per Tanya   EYE SURGERY Left 10/23/2021   LAPAROSCOPIC GASTRIC BANDING  07/07/2011   banding removed but has two clamps   LAPAROSCOPIC REPAIR AND REMOVAL OF GASTRIC BAND     LUMBAR LAMINECTOMY/ DECOMPRESSION WITH MET-RX Right 02/09/2023   Procedure: Right Lumbar four-five Minimally Invasive Laminectomy, Discectomy;  Surgeon: Cheryle Debby LABOR, MD;  Location: [facility];  Service: Neurosurgery;  Laterality: Right;   LUMBAR LAMINECTOMY/ DECOMPRESSION WITH MET-RX N/A 04/19/2023   Procedure: REVISION LUMBAR FOUR-LUMBAR FIVE MICRODISCECTOMY;  Surgeon: Cheryle Debby LABOR, MD;  Location: [facility] OR;  Service: Neurosurgery;  Laterality: N/A;  3C   UPPER GI ENDOSCOPY N/A 11/21/2012   Procedure: UPPER GI ENDOSCOPY ULUS DIAGNOSTIC LAPAROSCOPY /REMOVAL LAP GASTRIC BAND/INSERTION OF DRAIN;  Surgeon: Donnice KATHEE Lunger, MD;  Location: [facility];  Service: General;  Laterality: N/A;   Patient Active Problem List   Diagnosis Date Noted   Stress incontinence of urine 04/18/2024   Disorder of right sciatic nerve 09/30/2023   Lumbar post-laminectomy syndrome 09/30/2023   Radiculopathy due to lumbar intervertebral disc disorder 09/30/2023   Lumbar  radiculopathy 04/19/2023   OSA (obstructive sleep apnea) 03/30/2023   Migraine with aura and without status migrainosus, not intractable 12/14/2022   Right lumbar radiculopathy 12/14/2022   Chronic insomnia 12/14/2022   Nightmares 12/14/2022   Snoring 12/14/2022   Laryngopharyngeal reflux (LPR) 12/01/2022   Postural lightheadedness 07/16/2020   Family history of breast cancer 06/20/2020   Family history of pancreatic cancer 06/20/2020   IUD (intrauterine device) in place 06/20/2020   GERD (gastroesophageal reflux disease) 03/01/2017   Abdominal pain 03/01/2017   Gastroparesis  03/01/2017   LUQ abdominal pain 03/01/2017   Weight gain 03/01/2017   DOE (dyspnea on exertion) 07/20/2016   Erosion of gastric band-explantation March 2014 11/21/2012   Lapband APS with [facility] repair Oct 2012 10/22/2011    PCP: Rosamond Leta NOVAK, MD   REFERRING PROVIDER: Lonie Stephen Stank, MD  REFERRING DIAG: low back pain  Rationale for Evaluation and Treatment: Rehabilitation  THERAPY DIAG:  Other low back pain  Bilateral hip pain  Impaired functional mobility and activity tolerance  ONSET DATE: 04/19/2023  SUBJECTIVE:                                                                                                                                                                                           SUBJECTIVE STATEMENT: Patient reports she had a little fever last week but she's doing better. Reports she hasn't done much of HEP since last session. Reports after her initial eval she was in a lot of pain that night and for 2-3 days afterwards. Reports LBP at 6-7/10 today.    EVAL: Pt states pain started out as sciatica went on for a year and started going down to foot. Pt thinks it was 2 laminectomy's, pt reports the doctor is no longer doing. Pt states she wants to get her life back, with not having to depend on back medications. Pt states she wishes she had never had the surgery in the first place. Pt went for a second opinion and they recommended therapy. Pt states she gained a lot of weight after the surgery. Pt wants to get back stronger where it won't give out.  PERTINENT HISTORY:  -History of 2 back surgery -RLE numbness and tingling  PAIN:  Are you having pain? Yes: NPRS scale: 6/10 average  Pain location: across low back mainly right side, bilateral hips to Pain description: deep ache Aggravating factors: shower, walking, bending Relieving factors: sitting and laying down  PRECAUTIONS: None  RED FLAGS: anxiety   WEIGHT BEARING RESTRICTIONS: No  FALLS:  Has  patient fallen in last 6 months? Yes. Number of falls 1  OCCUPATION:  disability  PLOF: Independent with basic ADLs  PATIENT GOALS: get her life back, be able to walk and have back not give out  NEXT MD VISIT: 3 months of therapy and after  OBJECTIVE:  Note: Objective measures were completed at Evaluation unless otherwise noted.  DIAGNOSTIC FINDINGS:  CLINICAL DATA:  Fluoroscopic assistance for lumbar spine surgery   EXAM: LUMBAR SPINE - 1 VIEW   COMPARISON:  10/21/2021   FINDINGS: Fluoroscopic assistance was provided for lumbar spine surgery. Fluoroscopic time 10.7 seconds. Radiation dose 9.85 mGy.   IMPRESSION: Fluoroscopic assistance was provided for lumbar spine surgery  PATIENT SURVEYS:  Modified Oswestry: 29/50  COGNITION: Overall cognitive status: Within functional limits for tasks assessed     SENSATION: Light touch: WFL  POSTURE: rounded shoulders, forward head, decreased lumbar lordosis, and flexed trunk   PALPATION: Pt demonstrates increased sensitivity to palpation to bilateral paraspinals of lumbar spine, and to L3-L5 spinal spinous processes.  LUMBAR ROM:   AROM eval  Flexion 28, low back, bilateral  Extension 5, low back, worse pain  Right lateral flexion 20 degrees, pain in hip and low back and mid back   Left lateral flexion 15, right low back worst  Right rotation 25% available  Left rotation 75% available   (Blank rows = not tested)  LOWER EXTREMITY ROM:     Active  Right eval Left eval  Hip flexion    Hip extension    Hip abduction    Hip adduction    Hip internal rotation    Hip external rotation    Knee flexion    Knee extension    Ankle dorsiflexion    Ankle plantarflexion    Ankle inversion    Ankle eversion     (Blank rows = not tested)  LOWER EXTREMITY MMT:    MMT Right eval Left eval  Hip flexion 4- 4  Hip extension 3- 3+  Hip abduction 4- 4  Hip adduction 3 3+  Hip internal rotation    Hip external rotation     Knee flexion 4 4  Knee extension 4 4+  Ankle dorsiflexion 4 4  Ankle plantarflexion    Ankle inversion    Ankle eversion     (Blank rows = not tested)  LUMBAR SPECIAL TESTS:  Straight leg raise test: Positive  FUNCTIONAL TESTS:  5 times sit to stand: 17 seconds, no UE support, reports 7/10 pain on 05/01/24 2 minute walk test: 457 ft, pain stayed constant  GAIT: Distance walked: 80 feet to and from treatment area Assistive device utilized: None Level of assistance: Complete Independence Comments: antalgic gait pattern, decreased velocity, TBA further next session  TREATMENT DATE:  05/01/24: Review of HEP and goals 5 times sit to stand 2 Minute Walk Test Supine LTR, 10x each side  Bridges, 3 holds, 10x, inc pain, verbal cues to limit to 50% of motion Neutral Curl Up with Straight Leg, 10 x ea LE TA contraction + march, 10x DKTC w/ towel, 10x, 3 holds Clamshell, 2x10 each side  STS, 10x, verbal cueing  Prog to squat, 10x Standing hip extension, 10x each side Lateral stepping, at mat table, 10x down and back Seated scapular retraction, 10 holds, 10x (cued for TA/core engagement last 3 reps)   04/07/2024  Evaluation: -ROM measured, Strength assessed, HEP prescribed, pt educated on prognosis, findings, and importance of HEP compliance if given.   PATIENT EDUCATION:  Education details: Pt was educated on findings of PT evaluation, prognosis, frequency of  therapy visits and rationale, attendance policy, and HEP if given.   Person educated: Patient Education method: Explanation, Verbal cues, and Handouts Education comprehension: verbalized understanding, verbal cues required, and needs further education  HOME EXERCISE PROGRAM: Access Code: [email or web address] URL: [email or web address] Date: 04/07/2024 Prepared by: Lang Ada  Exercises - Supine Lower Trunk Rotation  - 1 x daily - 7 x weekly - 3 sets - 10 reps - Supine Bridge  - 1 x daily - 7 x weekly - 3 sets -  10 reps - 3 hold - Neutral Curl Up with Straight Leg  - 1 x daily - 7 x weekly - 3 sets - 10 reps - 3 hold  Access Code: [email or web address] URL: [email or web address] Date: 05/01/2024 Prepared by: Rosaria Powell-Butler  Exercises - Seated Scapular Retraction  - 2 x daily - 7 x weekly - 3 sets - 10 reps - 10 hold  ASSESSMENT:  CLINICAL IMPRESSION: Patient arrives to first follow up appointment since initial evaluation 3 weeks prior. Began with review of HEP and goals. Patient required some verbal cueing for form during review of HEP to reduce pain while performing, as pt was having increased pain with bridges and crunch. Educated on limited bridge to 50% of available motion and placing hand to the side of spine during crunch to continue receiving feedback but with less pain. Followed with 5 times sit to stand and 2 minute walk tests for goals. Remainder of session spent targeting core, and hip musculature weakness. Mild increase in pain (from 7/10 to 8/10) during squats. Ended session with scapular retractions to address increased thoracic kyphosis, pt tolerated well. Added to HEP. Patient will benefit from continued skilled physical therapy in order to improve strength, endurance/activity tolerance, reduce pain, and improve overall function/QOL.     EVAL: Patient is a 58 y.o. female who was seen today for physical therapy evaluation and treatment for low back pain.  Patient demonstrates increased bilateral back and hip pain, decreased LE strength, abnormal gait pattern, and impaired functional mobility. Patient also demonstrates difficulty with ambulation during today's session with decreased stride length and velocity noted. Patient also demonstrates abnormal sensitivity to palpation and mobilization of L3-L5 lumbar spinal segments. Patient requires education on role of PT, prognosis, and importance of compliance with HEP. Patient would benefit from skilled physical therapy for decreased back  pain, hip pain, increased endurance with ambulation, increased core/LE strength, and balance for improved gait quality, return to higher level of function with ADLs, and progress towards therapy goals.   OBJECTIVE IMPAIRMENTS: Abnormal gait, decreased activity tolerance, decreased balance, decreased endurance, decreased mobility, difficulty walking, decreased ROM, decreased strength, postural dysfunction, and pain.   ACTIVITY LIMITATIONS: carrying, lifting, bending, standing, squatting, sleeping, stairs, transfers, and bed mobility  PARTICIPATION LIMITATIONS: meal prep, cleaning, laundry, driving, shopping, community activity, and yard work  PERSONAL FACTORS: Age, Past/current experiences, Time since onset of injury/illness/exacerbation, and 1 comorbidity: 2 spinal surgeries are also affecting patient's functional outcome.   REHAB POTENTIAL: Fair, 2 spinal surgeries with no success  CLINICAL DECISION MAKING: Stable/uncomplicated  EVALUATION COMPLEXITY: Low   GOALS: Goals reviewed with patient? Yes  SHORT TERM GOALS: Target date: 04/28/24  Pt will be independent with HEP in order to demonstrate participation in Physical Therapy POC.  Baseline: Goal status: INITIAL  2.  Pt will report 4/10 pain with mobility in order to demonstrate improved pain with ADLs.  Baseline: 6/10 Goal status: INITIAL  LONG TERM GOALS: Target date: 05/19/24  Pt will improve 5TSTS by  2.3 seconds in order to demonstrate improved functional strength to return to desired activities.  Baseline: see objective.  Goal status: INITIAL  2.  Pt will improve 2 MWT by 40 feet in order to demonstrate improved functional ambulatory capacity in community setting.  Baseline: see objective.  Goal status: INITIAL  3.  Pt will improve Modified Oswestry score by at least 6 points in order to demonstrate improved pain with functional goals and outcomes. Baseline: see objective.  Goal status: INITIAL  4.  Pt will report  2/10 pain with mobility in order to demonstrate reduced pain with ADLs lasting greater than 30 minutes.  Baseline: see objective.  Goal status: INITIAL   PLAN:  PT FREQUENCY: 1-2x/week  PT DURATION: 6 weeks  PLANNED INTERVENTIONS: 97110-Therapeutic exercises, 97530- Therapeutic activity, [id number]- Neuromuscular re-education, 97535- Self Care, [id number]- Manual therapy, [phone]- Gait training, Patient/Family education, Balance training, Spinal mobilization, DME instructions, Cryotherapy, and Moist heat.  PLAN FOR NEXT SESSION: progress core and proximal LE strength    5:42 PM, 05/01/24 Markita Stcharles Powell-Butler, PT, DPT [facility] [place]

## 2024-05-02 ENCOUNTER — Ambulatory Visit (HOSPITAL_COMMUNITY): Admitting: Clinical

## 2024-05-02 DIAGNOSIS — F331 Major depressive disorder, recurrent, moderate: Secondary | ICD-10-CM | POA: Diagnosis not present

## 2024-05-02 DIAGNOSIS — F410 Panic disorder [episodic paroxysmal anxiety] without agoraphobia: Secondary | ICD-10-CM | POA: Diagnosis not present

## 2024-05-02 DIAGNOSIS — F419 Anxiety disorder, unspecified: Secondary | ICD-10-CM | POA: Diagnosis not present
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
## 2024-05-04 ENCOUNTER — Ambulatory Visit (HOSPITAL_COMMUNITY): Admitting: Physical Therapy

## 2024-05-04 DIAGNOSIS — M5459 Other low back pain: Secondary | ICD-10-CM

## 2024-05-04 DIAGNOSIS — Z7409 Other reduced mobility: Secondary | ICD-10-CM

## 2024-05-04 DIAGNOSIS — M25551 Pain in right hip: Secondary | ICD-10-CM

## 2024-05-04 NOTE — Therapy (Signed)
 OUTPATIENT PHYSICAL THERAPY THORACOLUMBAR TREATMENT   Patient Name: Kimberly Ortega MRN: [id number] DOB:09/03/1966, 58 y.o., female Today's Date: 05/04/2024  END OF SESSION:  PT End of Session - 05/04/24 1634     Visit Number 3    Date for PT Re-Evaluation 05/19/24    Authorization Type [place] MEDICAID [facility]    Authorization Time Period seeking auth    Progress Note Due on Visit 10    PT Start Time 1518    PT Stop Time 1602    PT Time Calculation (min) 44 min    Activity Tolerance Patient limited by pain;Patient tolerated treatment well    Behavior During Therapy [facility] for tasks assessed/performed           Past Medical History:  Diagnosis Date   Abdominal pain    Allergies    Anemia    Anxiety    Arthritis    Cancer (HCC)    skin   Complication of anesthesia 1991   dizziness; almost passed out, post op   Depression    Dyspnea    with exertion   Family history of breast cancer    Cousins on both sides of family   Fatty liver    GERD (gastroesophageal reflux disease) 03/01/2017   History of hiatal hernia    History of melanoma    History of recurrent UTIs    Migraine    last botox  injection 04-13-23   Multiple nevi    Obstructive sleep apnea    Panic disorder 1995   Polycystic ovarian disease    Past Surgical History:  Procedure Laterality Date   BIOPSY  04/09/2017   Procedure: BIOPSY;  Surgeon: Golda Claudis PENNER, MD;  [facility];  Service: Endoscopy;;  gastric esophageal    BIOPSY  02/02/2023   Procedure: BIOPSY;  Surgeon: Eartha Angelia Sieving, MD;  [facility]: [facility];  Service: Gastroenterology;;   CHOLECYSTECTOMY  1991   COLONOSCOPY  2016   DILATION AND CURETTAGE OF UTERUS     ESOPHAGOGASTRODUODENOSCOPY N/A 04/09/2017   Procedure: ESOPHAGOGASTRODUODENOSCOPY (EGD);  Surgeon: Golda Claudis PENNER, MD;  [facility];  Service: Endoscopy;  Laterality: N/A;  9:25   ESOPHAGOGASTRODUODENOSCOPY (EGD) WITH PROPOFOL  N/A 02/02/2023    Procedure: ESOPHAGOGASTRODUODENOSCOPY (EGD) WITH PROPOFOL ;  Surgeon: Eartha Angelia Sieving, MD;  [facility];  Service: Gastroenterology;  Laterality: N/A;  2:30 pm, moved to 5/14 per Tanya   EYE SURGERY Left 10/23/2021   LAPAROSCOPIC GASTRIC BANDING  07/07/2011   banding removed but has two clamps   LAPAROSCOPIC REPAIR AND REMOVAL OF GASTRIC BAND     LUMBAR LAMINECTOMY/ DECOMPRESSION WITH MET-RX Right 02/09/2023   Procedure: Right Lumbar four-five Minimally Invasive Laminectomy, Discectomy;  Surgeon: Cheryle Debby LABOR, MD;  Location: [facility];  Service: Neurosurgery;  Laterality: Right;   LUMBAR LAMINECTOMY/ DECOMPRESSION WITH MET-RX N/A 04/19/2023   Procedure: REVISION LUMBAR FOUR-LUMBAR FIVE MICRODISCECTOMY;  Surgeon: Cheryle Debby LABOR, MD;  Location: [facility] OR;  Service: Neurosurgery;  Laterality: N/A;  3C   UPPER GI ENDOSCOPY N/A 11/21/2012   Procedure: UPPER GI ENDOSCOPY ULUS DIAGNOSTIC LAPAROSCOPY /REMOVAL LAP GASTRIC BAND/INSERTION OF DRAIN;  Surgeon: Donnice KATHEE Lunger, MD;  Location: [facility];  Service: General;  Laterality: N/A;   Patient Active Problem List   Diagnosis Date Noted   Stress incontinence of urine 04/18/2024   Disorder of right sciatic nerve 09/30/2023   Lumbar post-laminectomy syndrome 09/30/2023   Radiculopathy due to lumbar intervertebral disc disorder 09/30/2023  Lumbar radiculopathy 04/19/2023   OSA (obstructive sleep apnea) 03/30/2023   Migraine with aura and without status migrainosus, not intractable 12/14/2022   Right lumbar radiculopathy 12/14/2022   Chronic insomnia 12/14/2022   Nightmares 12/14/2022   Snoring 12/14/2022   Laryngopharyngeal reflux (LPR) 12/01/2022   Postural lightheadedness 07/16/2020   Family history of breast cancer 06/20/2020   Family history of pancreatic cancer 06/20/2020   IUD (intrauterine device) in place 06/20/2020   GERD (gastroesophageal reflux disease) 03/01/2017   Abdominal pain 03/01/2017   Gastroparesis  03/01/2017   LUQ abdominal pain 03/01/2017   Weight gain 03/01/2017   DOE (dyspnea on exertion) 07/20/2016   Erosion of gastric band-explantation March 2014 11/21/2012   Lapband APS with [facility] repair Oct 2012 10/22/2011    PCP: Rosamond Leta NOVAK, MD   REFERRING PROVIDER: Lonie Stephen Stank, MD  REFERRING DIAG: low back pain  Rationale for Evaluation and Treatment: Rehabilitation  THERAPY DIAG:  Other low back pain  Bilateral hip pain  Impaired functional mobility and activity tolerance  ONSET DATE: 04/19/2023  SUBJECTIVE:                                                                                                                                                                                           SUBJECTIVE STATEMENT: Patient reports she has done her HEP but completed on the bed.  Reports she has increased pain since the last visit and reports the cause is these exercises that she's not used to. Now 8/10 in lower back, Lt>Rt and goes into both of her hips.      EVAL: Pt states pain started out as sciatica went on for a year and started going down to foot. Pt thinks it was 2 laminectomy's, pt reports the doctor is no longer doing. Pt states she wants to get her life back, with not having to depend on back medications. Pt states she wishes she had never had the surgery in the first place. Pt went for a second opinion and they recommended therapy. Pt states she gained a lot of weight after the surgery. Pt wants to get back stronger where it won't give out.  PERTINENT HISTORY:  -History of 2 back surgery -RLE numbness and tingling  PAIN:  Are you having pain? Yes: NPRS scale: 6/10 average  Pain location: across low back mainly right side, bilateral hips to Pain description: deep ache Aggravating factors: shower, walking, bending Relieving factors: sitting and laying down  PRECAUTIONS: None  RED FLAGS: anxiety   WEIGHT BEARING RESTRICTIONS: No  FALLS:  Has patient  fallen in last 6 months? Yes. Number of falls  1  OCCUPATION: disability  PLOF: Independent with basic ADLs  PATIENT GOALS: get her life back, be able to walk and have back not give out  NEXT MD VISIT: 3 months of therapy and after  OBJECTIVE:  Note: Objective measures were completed at Evaluation unless otherwise noted.  DIAGNOSTIC FINDINGS:  CLINICAL DATA:  Fluoroscopic assistance for lumbar spine surgery   EXAM: LUMBAR SPINE - 1 VIEW   COMPARISON:  10/21/2021   FINDINGS: Fluoroscopic assistance was provided for lumbar spine surgery. Fluoroscopic time 10.7 seconds. Radiation dose 9.85 mGy.   IMPRESSION: Fluoroscopic assistance was provided for lumbar spine surgery  PATIENT SURVEYS:  Modified Oswestry: 29/50  COGNITION: Overall cognitive status: Within functional limits for tasks assessed     SENSATION: Light touch: WFL  POSTURE: rounded shoulders, forward head, decreased lumbar lordosis, and flexed trunk   PALPATION: Pt demonstrates increased sensitivity to palpation to bilateral paraspinals of lumbar spine, and to L3-L5 spinal spinous processes.  LUMBAR ROM:   AROM eval  Flexion 28, low back, bilateral  Extension 5, low back, worse pain  Right lateral flexion 20 degrees, pain in hip and low back and mid back   Left lateral flexion 15, right low back worst  Right rotation 25% available  Left rotation 75% available   (Blank rows = not tested)   LOWER EXTREMITY MMT:    MMT Right eval Left eval  Hip flexion 4- 4  Hip extension 3- 3+  Hip abduction 4- 4  Hip adduction 3 3+  Hip internal rotation    Hip external rotation    Knee flexion 4 4  Knee extension 4 4+  Ankle dorsiflexion 4 4  Ankle plantarflexion    Ankle inversion    Ankle eversion     (Blank rows = not tested)  LUMBAR SPECIAL TESTS:  Straight leg raise test: Positive  FUNCTIONAL TESTS:  5 times sit to stand: 17 seconds, no UE support, reports 7/10 pain on 05/01/24 2 minute walk  test: 457 ft, pain stayed constant  GAIT: Distance walked: 80 feet to and from treatment area Assistive device utilized: None Level of assistance: Complete Independence Comments: antalgic gait pattern, decreased velocity, TBA further next session  TREATMENT DATE:  05/04/24 Supine  TA isometric contraction 10X5  TA contraction with march alternating 10X  SLR each side with TA cont 10X each LE  Bridge 10X3 holds LTR, 10x each side  Piriformis stretch bilaterally Hamstring stretch with towel 2X30 bil  05/01/24: Review of HEP and goals 5 times sit to stand 2 Minute Walk Test Supine LTR, 10x each side  Bridges, 3 holds, 10x, inc pain, verbal cues to limit to 50% of motion Neutral Curl Up with Straight Leg, 10 x ea LE TA contraction + march, 10x DKTC w/ towel, 10x, 3 holds Clamshell, 2x10 each side  STS, 10x, verbal cueing  Prog to squat, 10x Standing hip extension, 10x each side Lateral stepping, at mat table, 10x down and back Seated scapular retraction, 10 holds, 10x (cued for TA/core engagement last 3 reps)   04/07/2024  Evaluation: -ROM measured, Strength assessed, HEP prescribed, pt educated on prognosis, findings, and importance of HEP compliance if given.   PATIENT EDUCATION:  Education details: Pt was educated on findings of PT evaluation, prognosis, frequency of therapy visits and rationale, attendance policy, and HEP if given.   Person educated: Patient Education method: Explanation, Verbal cues, and Handouts Education comprehension: verbalized understanding, verbal cues required, and needs further education  HOME EXERCISE  PROGRAM: Access Code: [email or web address] URL: [email or web address] Date: 04/07/2024 Prepared by: Lang Ada  Exercises - Supine Lower Trunk Rotation  - 1 x daily - 7 x weekly - 3 sets - 10 reps - Supine Bridge  - 1 x daily - 7 x weekly - 3 sets - 10 reps - 3 hold - Neutral Curl Up with Straight Leg  - 1 x daily - 7 x weekly - 3  sets - 10 reps - 3 hold  Access Code: [email or web address] URL: [email or web address] Date: 05/01/2024 Prepared by: Rosaria Powell-Butler  Exercises - Seated Scapular Retraction  - 2 x daily - 7 x weekly - 3 sets - 10 reps - 10 hold  ASSESSMENT:  CLINICAL IMPRESSION: Patient with increased pain this session.  Completed all exercises in supine today starting with isometric core activation and incorporating LE movements with stability.  Pt was able to complete all these without c/o pain or difficulty.  Added hamstring and piriformis stretch with use of towel with noted tightness bilaterally with both.  Pt encouraged to continue prescribed exercises within painfree ROM.  Patient will benefit from continued skilled physical therapy in order to improve strength, endurance/activity tolerance, reduce pain, and improve overall function/QOL.   EVAL: Patient is a 58 y.o. female who was seen today for physical therapy evaluation and treatment for low back pain.  Patient demonstrates increased bilateral back and hip pain, decreased LE strength, abnormal gait pattern, and impaired functional mobility. Patient also demonstrates difficulty with ambulation during today's session with decreased stride length and velocity noted. Patient also demonstrates abnormal sensitivity to palpation and mobilization of L3-L5 lumbar spinal segments. Patient requires education on role of PT, prognosis, and importance of compliance with HEP. Patient would benefit from skilled physical therapy for decreased back pain, hip pain, increased endurance with ambulation, increased core/LE strength, and balance for improved gait quality, return to higher level of function with ADLs, and progress towards therapy goals.   OBJECTIVE IMPAIRMENTS: Abnormal gait, decreased activity tolerance, decreased balance, decreased endurance, decreased mobility, difficulty walking, decreased ROM, decreased strength, postural dysfunction, and pain.    ACTIVITY LIMITATIONS: carrying, lifting, bending, standing, squatting, sleeping, stairs, transfers, and bed mobility  PARTICIPATION LIMITATIONS: meal prep, cleaning, laundry, driving, shopping, community activity, and yard work  PERSONAL FACTORS: Age, Past/current experiences, Time since onset of injury/illness/exacerbation, and 1 comorbidity: 2 spinal surgeries are also affecting patient's functional outcome.   REHAB POTENTIAL: Fair, 2 spinal surgeries with no success  CLINICAL DECISION MAKING: Stable/uncomplicated  EVALUATION COMPLEXITY: Low   GOALS: Goals reviewed with patient? Yes  SHORT TERM GOALS: Target date: 04/28/24  Pt will be independent with HEP in order to demonstrate participation in Physical Therapy POC.  Baseline: Goal status: INITIAL  2.  Pt will report 4/10 pain with mobility in order to demonstrate improved pain with ADLs.  Baseline: 6/10 Goal status: INITIAL  LONG TERM GOALS: Target date: 05/19/24  Pt will improve 5TSTS by 2.3 seconds in order to demonstrate improved functional strength to return to desired activities.  Baseline: see objective.  Goal status: INITIAL  2.  Pt will improve 2 MWT by 40 feet in order to demonstrate improved functional ambulatory capacity in community setting.  Baseline: see objective.  Goal status: INITIAL  3.  Pt will improve Modified Oswestry score by at least 6 points in order to demonstrate improved pain with functional goals and outcomes. Baseline: see objective.  Goal status: INITIAL  4.  Pt will report 2/10 pain with mobility in order to  demonstrate reduced pain with ADLs lasting greater than 30 minutes.  Baseline: see objective.  Goal status: INITIAL   PLAN:  PT FREQUENCY: 1-2x/week  PT DURATION: 6 weeks  PLANNED INTERVENTIONS: 97110-Therapeutic exercises, 97530- Therapeutic activity, [id number]- Neuromuscular re-education, 97535- Self Care, 02859- Manual therapy, [phone]- Gait training, Patient/Family education,  Balance training, Spinal mobilization, DME instructions, Cryotherapy, and Moist heat.  PLAN FOR NEXT SESSION: progress core and proximal LE strength    4:34 PM, 05/04/24 Greig KATHEE Fuse, PTA/CLT [facility] Ph: [phone]

## 2024-05-05 ENCOUNTER — Telehealth: Payer: Self-pay | Admitting: *Deleted

## 2024-05-05 NOTE — Telephone Encounter (Signed)
 Source  Kimberly Ortega (Patient)   Subject  Kimberly Ortega (Patient)   Topic  General - Other    Communication  Reason for CRM: [facility] is calling to check to see if we received md documents they faxed over.    [facility] number [phone]    Routing to [facility] clinical

## 2024-05-05 NOTE — Telephone Encounter (Signed)
 Have not seen a fax with this pt info.

## 2024-05-08 ENCOUNTER — Encounter: Payer: Self-pay | Admitting: Dermatology

## 2024-05-08 ENCOUNTER — Ambulatory Visit: Admitting: Dermatology

## 2024-05-08 DIAGNOSIS — L821 Other seborrheic keratosis: Secondary | ICD-10-CM

## 2024-05-08 DIAGNOSIS — L578 Other skin changes due to chronic exposure to nonionizing radiation: Secondary | ICD-10-CM | POA: Diagnosis not present

## 2024-05-08 DIAGNOSIS — I788 Other diseases of capillaries: Secondary | ICD-10-CM | POA: Diagnosis not present

## 2024-05-08 MED ORDER — TRETINOIN 0.1 % EX CREA: TOPICAL_CREAM | Freq: Every day | CUTANEOUS | 0 refills | Status: DC

## 2024-05-08 NOTE — Patient Instructions (Signed)

## 2024-05-08 NOTE — Progress Notes (Addendum)
 New Patient Visit   Subjective  Kimberly Ortega is a 58 y.o. female who presents for the following: lesion of concern.   Patient states she has a dry spot located at the right forearm that she would like to have examined. Patient reports the areas have been there for 4 months. She reports the areas are bothersome. Patient reports she has not previously been treated for these areas. Patient  Hx of bx. Patient has family history of skin cancer(s).  Patient has a blackhead on her left preauricular that itches. She states that when it is removed it comes back. She also has red spots on her legs that she would like evaluated.   Patient has previously been to a dermatologist yearly, and was last seen in 2024. She has previously had Mohs surgery on her back and left arm.   The patient has spots, moles and lesions to be evaluated, some may be new or changing.  The following portions of the chart were reviewed this encounter and updated as appropriate: medications, allergies, medical history  Review of Systems:  No other skin or systemic complaints except as noted in HPI or Assessment and Plan.  Objective  Well appearing patient in no apparent distress; mood and affect are within normal limits.   A focused examination was performed of the following areas: Right forearm.   Relevant exam findings are noted in the Assessment and Plan.     Assessment & Plan   SEBORRHEIC KERATOSIS - Stuck-on, waxy, tan-brown papules and/or plaques  - Benign-appearing - Discussed benign etiology and prognosis. - Observe - Call for any changes  Recommend that patient have a complete skin exam with Erminio so that acute and chronic concerns can be addressed. Appointment was scheduled today as an Urgent Referral for a lesion on the right forearm.   Capillaritis- bilateral lower legs Patient was counseled regarding capillaritis (pigmented purpuric dermatosis) of the lower legs, a benign condition characterized  by leaky capillaries leading to reddish-brown patches or cayenne pepper-like spots on the skin. Explained that this is not related to systemic bleeding or clotting disorders and does not typically progress to more serious disease. Discussed that lesions may persist for weeks to months, recur intermittently, or become chronic but are generally asymptomatic aside from mild itch or cosmetic concern. No specific treatment is required, though supportive measures such as leg elevation, compression stockings, and minimizing prolonged standing may help reduce recurrence. Reviewed that topical corticosteroids or other anti-inflammatory creams may be considered if symptomatic. Patient reassured regarding benign nature of the condition, with recommendation to monitor for changes and return if lesions spread rapidly, become painful, ulcerated, or if new systemic symptoms arise.  Photoaging of the Face-Chronic Condition with Acute Flare, not at treatment goal The patient was evaluated for photoaging of the face, a chronic condition with an acute flare. Current symptoms and skin changes indicate that treatment goals have not yet been met. The patient was counseled on the importance of consistent sun protection, including daily use of broad-spectrum sunscreen (SPF 30 or higher), wearing protective clothing, and avoiding excessive sun exposure to prevent further damage. The use of tretinoin  was discussed as a key treatment for improving skin texture, fine lines, and hyperpigmentation, with instructions on proper application, potential irritation, and the importance of long-term adherence for optimal results. The patient was encouraged to continue treatment and follow up for further evaluation and possible treatment adjustments.  SEBORRHEIC KERATOSIS Right Forearm - Posterior Benign.  CAPILLARITIS (2) Left Lower  Leg - Anterior, Right Lower Leg - Anterior Advised patient that elevating the legs and reducing heat can  improve.   Return for FBSE with Erminio.  LILLETTE Rollene Gobble, RN, am acting as scribe for RUFUS CHRISTELLA HOLY, MD .   Documentation: I have reviewed the above documentation for accuracy and completeness, and I agree with the above.  RUFUS CHRISTELLA HOLY, MD

## 2024-05-09 ENCOUNTER — Telehealth: Payer: Self-pay | Admitting: Internal Medicine

## 2024-05-09 NOTE — Telephone Encounter (Signed)
 Called Ann from [place] and left a voicemail stating I have not received a CMN on this pt unless it was sent to the [facility] street location, informed in the detailed message [place] office fax number to refax.

## 2024-05-09 NOTE — Telephone Encounter (Unsigned)
 Copied from CRM #[id number]. Topic: Clinical - Order For Equipment >> May 09, 2024 11:16 AM Leila BROCKS wrote: Reason for CRM: Jenkins from [facility] [phone] ext [phone] and fax #[phone] states faxed a CMN, MD order for pulse ox and requesting for office notes on 05/01/24. Please advise and call back.

## 2024-05-10 ENCOUNTER — Telehealth: Payer: Self-pay

## 2024-05-10 ENCOUNTER — Encounter (HOSPITAL_COMMUNITY): Admitting: Physical Therapy

## 2024-05-10 ENCOUNTER — Other Ambulatory Visit: Payer: Self-pay

## 2024-05-10 DIAGNOSIS — R0600 Dyspnea, unspecified: Secondary | ICD-10-CM

## 2024-05-10 NOTE — Telephone Encounter (Signed)
 Copied from CRM #[id number]. Topic: Clinical - Order For Equipment >> May 10, 2024  2:12 PM Rozanna MATSU wrote: Reason for CRM: ANN WITH [employer] CALLING ABOUT THE CMN THAT WAS FAXED STATED PAGES [phone] ARE BLANK PLEASE REFAX TO [phone]  Need to refax what all they need again for the pulse ox - Unsure of what the pages consist of. The MD order was signed and sent, LOV notes where printed and Dr Leisa Signatures on the CMN as stated they needed for a pulse oximeter

## 2024-05-10 NOTE — Telephone Encounter (Signed)
 ann work with [employer] this is for patient pulse oximeter just want to be sure the clinic received the fax for the order Ms. Jenkins is going to refax but she did fax it yesterday to the number [phone]. Please reach out to ann if fax still has not been received  [phone] ext [phone]

## 2024-05-10 NOTE — Telephone Encounter (Signed)
 Called and spoke with Jenkins at [facility] paperwork was faxed and confirmation received at 9:35 am.

## 2024-05-12 ENCOUNTER — Telehealth: Payer: Self-pay | Admitting: Internal Medicine

## 2024-05-12 ENCOUNTER — Telehealth: Payer: Self-pay

## 2024-05-12 NOTE — Telephone Encounter (Signed)
 Per Avelina at [employer] for pulse oximeter-  Is this for an ONO or a device to check her sats? Please advise

## 2024-05-12 NOTE — Telephone Encounter (Addendum)
 Patient state's she do not want the incontinence supplies/ pull ups. Patient state's she not having any symptoms and do not want a follow up appointment. Spoke to Howe at [facility] and made her aware Pt do not want incontinence supplies.

## 2024-05-12 NOTE — Telephone Encounter (Signed)
 I have sent this over to [employer] for clarification and will inform pt. NFN

## 2024-05-16 ENCOUNTER — Ambulatory Visit (HOSPITAL_COMMUNITY)

## 2024-05-16 ENCOUNTER — Encounter (HOSPITAL_COMMUNITY): Payer: Self-pay | Admitting: Physical Therapy

## 2024-05-16 DIAGNOSIS — Z7409 Other reduced mobility: Secondary | ICD-10-CM

## 2024-05-16 DIAGNOSIS — M5459 Other low back pain: Secondary | ICD-10-CM | POA: Diagnosis not present

## 2024-05-16 DIAGNOSIS — M25551 Pain in right hip: Secondary | ICD-10-CM

## 2024-05-16 NOTE — Therapy (Signed)
 OUTPATIENT PHYSICAL THERAPY THORACOLUMBAR TREATMENT   Patient Name: Kimberly Ortega MRN: [id number] DOB:[birth year], 58 y.o., female Today's Date: 05/16/2024  END OF SESSION:  PT End of Session - 05/16/24 1615     Visit Number 4    Number of Visits 12    Date for PT Re-Evaluation 05/19/24    Authorization Type [place] MEDICAID [facility]    Authorization Time Period seeking auth    Progress Note Due on Visit 10    PT Start Time 1522    PT Stop Time 1610    PT Time Calculation (min) 48 min    Activity Tolerance Patient limited by pain;Patient tolerated treatment well    Behavior During Therapy [facility] for tasks assessed/performed            Past Medical History:  Diagnosis Date   Abdominal pain    Allergies    Anemia    Anxiety    Arthritis    Cancer (HCC)    skin   Complication of anesthesia 1991   dizziness; almost passed out, post op   Depression    Dyspnea    with exertion   Family history of breast cancer    Cousins on both sides of family   Fatty liver    GERD (gastroesophageal reflux disease) 03/01/2017   History of basal cell cancer    History of hiatal hernia    History of melanoma    History of melanoma    History of recurrent UTIs    Migraine    last botox  injection 04-13-23   Multiple nevi    Obstructive sleep apnea    Panic disorder 1995   Polycystic ovarian disease    Past Surgical History:  Procedure Laterality Date   BIOPSY  04/09/2017   Procedure: BIOPSY;  Surgeon: Golda Claudis PENNER, MD;  Location: [facility];  Service: Endoscopy;;  gastric esophageal    BIOPSY  02/02/2023   Procedure: BIOPSY;  Surgeon: Eartha Angelia Sieving, MD;  Location: [facility];  Service: Gastroenterology;;   CHOLECYSTECTOMY  1991   COLONOSCOPY  2016   DILATION AND CURETTAGE OF UTERUS     ESOPHAGOGASTRODUODENOSCOPY N/A 04/09/2017   Procedure: ESOPHAGOGASTRODUODENOSCOPY (EGD);  Surgeon: Golda Claudis PENNER, MD;  [facility]: [facility];  Service: Endoscopy;  Laterality:  N/A;  9:25   ESOPHAGOGASTRODUODENOSCOPY (EGD) WITH PROPOFOL  N/A 02/02/2023   Procedure: ESOPHAGOGASTRODUODENOSCOPY (EGD) WITH PROPOFOL ;  Surgeon: Eartha Angelia Sieving, MD;  Location: [facility];  Service: Gastroenterology;  Laterality: N/A;  2:30 pm, moved to 5/14 per Tanya   EYE SURGERY Left 10/23/2021   LAPAROSCOPIC GASTRIC BANDING  07/07/2011   banding removed but has two clamps   LAPAROSCOPIC REPAIR AND REMOVAL OF GASTRIC BAND     LUMBAR LAMINECTOMY/ DECOMPRESSION WITH MET-RX Right 02/09/2023   Procedure: Right Lumbar four-five Minimally Invasive Laminectomy, Discectomy;  Surgeon: Cheryle Debby LABOR, MD;  Location: [facility];  Service: Neurosurgery;  Laterality: Right;   LUMBAR LAMINECTOMY/ DECOMPRESSION WITH MET-RX N/A 04/19/2023   Procedure: REVISION LUMBAR FOUR-LUMBAR FIVE MICRODISCECTOMY;  Surgeon: Cheryle Debby LABOR, MD;  Location: [facility];  Service: Neurosurgery;  Laterality: N/A;  3C   UPPER GI ENDOSCOPY N/A 11/21/2012   Procedure: UPPER GI ENDOSCOPY ULUS DIAGNOSTIC LAPAROSCOPY /REMOVAL LAP GASTRIC BAND/INSERTION OF DRAIN;  Surgeon: Donnice KATHEE Lunger, MD;  Location: [facility];  Service: General;  Laterality: N/A;   Patient Active Problem List   Diagnosis Date Noted   Stress incontinence of urine 04/18/2024   Disorder of  right sciatic nerve 09/30/2023   Lumbar post-laminectomy syndrome 09/30/2023   Radiculopathy due to lumbar intervertebral disc disorder 09/30/2023   Lumbar radiculopathy 04/19/2023   OSA (obstructive sleep apnea) 03/30/2023   Migraine with aura and without status migrainosus, not intractable 12/14/2022   Right lumbar radiculopathy 12/14/2022   Chronic insomnia 12/14/2022   Nightmares 12/14/2022   Snoring 12/14/2022   Laryngopharyngeal reflux (LPR) 12/01/2022   Postural lightheadedness 07/16/2020   Family history of breast cancer 06/20/2020   Family history of pancreatic cancer 06/20/2020   IUD (intrauterine device) in place 06/20/2020   GERD  (gastroesophageal reflux disease) 03/01/2017   Abdominal pain 03/01/2017   Gastroparesis 03/01/2017   LUQ abdominal pain 03/01/2017   Weight gain 03/01/2017   DOE (dyspnea on exertion) 07/20/2016   Erosion of gastric band-explantation March 2014 11/21/2012   Lapband APS with [facility] repair Oct 2012 10/22/2011    PCP: Rosamond Leta NOVAK, MD   REFERRING PROVIDER: Lonie Stephen Stank, MD  REFERRING DIAG: low back pain  Rationale for Evaluation and Treatment: Rehabilitation  THERAPY DIAG:  Other low back pain  Bilateral hip pain  Impaired functional mobility and activity tolerance  ONSET DATE: 04/19/2023  SUBJECTIVE:                                                                                                                                                                                           SUBJECTIVE STATEMENT: Reports increased back in lower back and goes in Rt foot, believes related to her SKTC stretch last session.  Current pain scale 9/10.   EVAL: Pt states pain started out as sciatica went on for a year and started going down to foot. Pt thinks it was 2 laminectomy's, pt reports the doctor is no longer doing. Pt states she wants to get her life back, with not having to depend on back medications. Pt states she wishes she had never had the surgery in the first place. Pt went for a second opinion and they recommended therapy. Pt states she gained a lot of weight after the surgery. Pt wants to get back stronger where it won't give out.  PERTINENT HISTORY:  -History of 2 back surgery -RLE numbness and tingling  PAIN:  Are you having pain? Yes: NPRS scale: 9/10 average  Pain location: across low back mainly right side, bilateral hips to Pain description: deep ache Aggravating factors: shower, walking, bending Relieving factors: sitting and laying down  PRECAUTIONS: None  RED FLAGS: anxiety   WEIGHT BEARING RESTRICTIONS: No  FALLS:  Has patient fallen in last 6  months? Yes. Number of falls 1  OCCUPATION: disability  PLOF: Independent with basic ADLs  PATIENT GOALS: get her life back, be able to walk and have back not give out  NEXT MD VISIT: 3 months of therapy and after  OBJECTIVE:  Note: Objective measures were completed at Evaluation unless otherwise noted.  DIAGNOSTIC FINDINGS:  CLINICAL DATA:  Fluoroscopic assistance for lumbar spine surgery   EXAM: LUMBAR SPINE - 1 VIEW   COMPARISON:  10/21/2021   FINDINGS: Fluoroscopic assistance was provided for lumbar spine surgery. Fluoroscopic time 10.7 seconds. Radiation dose 9.85 mGy.   IMPRESSION: Fluoroscopic assistance was provided for lumbar spine surgery  PATIENT SURVEYS:  Modified Oswestry: 29/50  COGNITION: Overall cognitive status: Within functional limits for tasks assessed     SENSATION: Light touch: WFL  POSTURE: rounded shoulders, forward head, decreased lumbar lordosis, and flexed trunk   PALPATION: Pt demonstrates increased sensitivity to palpation to bilateral paraspinals of lumbar spine, and to L3-L5 spinal spinous processes.  LUMBAR ROM:   AROM eval  Flexion 28, low back, bilateral  Extension 5, low back, worse pain  Right lateral flexion 20 degrees, pain in hip and low back and mid back   Left lateral flexion 15, right low back worst  Right rotation 25% available  Left rotation 75% available   (Blank rows = not tested)   LOWER EXTREMITY MMT:    MMT Right eval Left eval  Hip flexion 4- 4  Hip extension 3- 3+  Hip abduction 4- 4  Hip adduction 3 3+  Hip internal rotation    Hip external rotation    Knee flexion 4 4  Knee extension 4 4+  Ankle dorsiflexion 4 4  Ankle plantarflexion    Ankle inversion    Ankle eversion     (Blank rows = not tested)  LUMBAR SPECIAL TESTS:  Straight leg raise test: Positive  FUNCTIONAL TESTS:  5 times sit to stand: 17 seconds, no UE support, reports 7/10 pain on 05/01/24 2 minute walk test: 457 ft, pain  stayed constant  GAIT: Distance walked: 80 feet to and from treatment area Assistive device utilized: None Level of assistance: Complete Independence Comments: antalgic gait pattern, decreased velocity, TBA further next session  TREATMENT DATE:  05/16/24 Supine with MHP for 5 min on lower back prior exercise  -Decompression 1-5 5x 5  -Deep breathing with one hand of chest/ one on stomach x 2'  -TrA activation paired with exhalation x 8'  -Log rolling  05/04/24 Supine  TA isometric contraction 10X5  TA contraction with march alternating 10X  SLR each side with TA cont 10X each LE  Bridge 10X3 holds LTR, 10x each side  Piriformis stretch bilaterally Hamstring stretch with towel 2X30 bil  05/01/24: Review of HEP and goals 5 times sit to stand 2 Minute Walk Test Supine LTR, 10x each side  Bridges, 3 holds, 10x, inc pain, verbal cues to limit to 50% of motion Neutral Curl Up with Straight Leg, 10 x ea LE TA contraction + march, 10x DKTC w/ towel, 10x, 3 holds Clamshell, 2x10 each side  STS, 10x, verbal cueing  Prog to squat, 10x Standing hip extension, 10x each side Lateral stepping, at mat table, 10x down and back Seated scapular retraction, 10 holds, 10x (cued for TA/core engagement last 3 reps)   04/07/2024  Evaluation: -ROM measured, Strength assessed, HEP prescribed, pt educated on prognosis, findings, and importance of HEP compliance if given.   PATIENT EDUCATION:  Education details: Pt was educated on findings of PT evaluation, prognosis, frequency  of therapy visits and rationale, attendance policy, and HEP if given.   Person educated: Patient Education method: Explanation, Verbal cues, and Handouts Education comprehension: verbalized understanding, verbal cues required, and needs further education  HOME EXERCISE PROGRAM: Access Code: [email or web address] URL: [email or web address] Date: 04/07/2024 Prepared by: Lang Ada  Exercises - Supine Lower  Trunk Rotation  - 1 x daily - 7 x weekly - 3 sets - 10 reps - Supine Bridge  - 1 x daily - 7 x weekly - 3 sets - 10 reps - 3 hold - Neutral Curl Up with Straight Leg  - 1 x daily - 7 x weekly - 3 sets - 10 reps - 3 hold  Access Code: [email or web address] URL: [email or web address] Date: 05/01/2024 Prepared by: Rosaria Powell-Butler  Exercises - Seated Scapular Retraction  - 2 x daily - 7 x weekly - 3 sets - 10 reps - 10 hold  ASSESSMENT:  CLINICAL IMPRESSION: Reports of increased LBP and radicular symptoms down Rt LE to feet following last session.  Pt presents with forward head and rolled shoulders.  Pt educated on importance of posture for pain control.  Began session with heat during decompression exercises for posterior chain strengthening with good tolerance.  Added decompression to HEP with printout given.  Educated on deep breathing to calm CNS.  Educated TrA paired with exhalation to improve contraction for abdominal strengthening.  Educated on log rolling to reduce stress on lower back.  EVAL: Patient is a 58 y.o. female who was seen today for physical therapy evaluation and treatment for low back pain.  Patient demonstrates increased bilateral back and hip pain, decreased LE strength, abnormal gait pattern, and impaired functional mobility. Patient also demonstrates difficulty with ambulation during today's session with decreased stride length and velocity noted. Patient also demonstrates abnormal sensitivity to palpation and mobilization of L3-L5 lumbar spinal segments. Patient requires education on role of PT, prognosis, and importance of compliance with HEP. Patient would benefit from skilled physical therapy for decreased back pain, hip pain, increased endurance with ambulation, increased core/LE strength, and balance for improved gait quality, return to higher level of function with ADLs, and progress towards therapy goals.   OBJECTIVE IMPAIRMENTS: Abnormal gait, decreased  activity tolerance, decreased balance, decreased endurance, decreased mobility, difficulty walking, decreased ROM, decreased strength, postural dysfunction, and pain.   ACTIVITY LIMITATIONS: carrying, lifting, bending, standing, squatting, sleeping, stairs, transfers, and bed mobility  PARTICIPATION LIMITATIONS: meal prep, cleaning, laundry, driving, shopping, community activity, and yard work  PERSONAL FACTORS: Age, Past/current experiences, Time since onset of injury/illness/exacerbation, and 1 comorbidity: 2 spinal surgeries are also affecting patient's functional outcome.   REHAB POTENTIAL: Fair, 2 spinal surgeries with no success  CLINICAL DECISION MAKING: Stable/uncomplicated  EVALUATION COMPLEXITY: Low   GOALS: Goals reviewed with patient? Yes  SHORT TERM GOALS: Target date: 04/28/24  Pt will be independent with HEP in order to demonstrate participation in Physical Therapy POC.  Baseline: Goal status: INITIAL  2.  Pt will report 4/10 pain with mobility in order to demonstrate improved pain with ADLs.  Baseline: 6/10 Goal status: INITIAL  LONG TERM GOALS: Target date: 05/19/24  Pt will improve 5TSTS by 2.3 seconds in order to demonstrate improved functional strength to return to desired activities.  Baseline: see objective.  Goal status: INITIAL  2.  Pt will improve 2 MWT by 40 feet in order to demonstrate improved functional ambulatory capacity in community setting.  Baseline: see objective.  Goal status: INITIAL  3.  Pt will improve  Modified Oswestry score by at least 6 points in order to demonstrate improved pain with functional goals and outcomes. Baseline: see objective.  Goal status: INITIAL  4.  Pt will report 2/10 pain with mobility in order to demonstrate reduced pain with ADLs lasting greater than 30 minutes.  Baseline: see objective.  Goal status: INITIAL   PLAN:  PT FREQUENCY: 1-2x/week  PT DURATION: 6 weeks  PLANNED INTERVENTIONS:  97110-Therapeutic exercises, 97530- Therapeutic activity, [id number]- Neuromuscular re-education, 97535- Self Care, [id number]- Manual therapy, [phone]- Gait training, Patient/Family education, Balance training, Spinal mobilization, DME instructions, Cryotherapy, and Moist heat.  PLAN FOR NEXT SESSION: progress core and proximal LE strength.  Reassess next session.   Augustin Mclean, LPTA/CLT; [facility] [phone]  4:21 PM, 05/16/24

## 2024-05-17 ENCOUNTER — Telehealth: Payer: Self-pay | Admitting: Internal Medicine

## 2024-05-17 NOTE — Telephone Encounter (Signed)
 Copied from CRM [id number]. Topic: Clinical - Medication Prior Auth >> May 17, 2024 12:39 PM Ismael A wrote: Reason for CRM: Arlean from [employer]ph: [phone] Called in to confirm receipt of CMN form received via fax , she is requesting to have provider sign & date and faxed [phone]  LVM for Anne at [facility] have not received a fax from them today (05/17/24)---requested she re-fax to [phone]

## 2024-05-18 ENCOUNTER — Ambulatory Visit (HOSPITAL_COMMUNITY)

## 2024-05-18 ENCOUNTER — Encounter (HOSPITAL_COMMUNITY): Payer: Self-pay

## 2024-05-18 DIAGNOSIS — M25551 Pain in right hip: Secondary | ICD-10-CM

## 2024-05-18 DIAGNOSIS — M5459 Other low back pain: Secondary | ICD-10-CM

## 2024-05-18 DIAGNOSIS — Z7409 Other reduced mobility: Secondary | ICD-10-CM

## 2024-05-18 NOTE — Therapy (Signed)
 OUTPATIENT PHYSICAL THERAPY THORACOLUMBAR TREATMENT/PN/RE-EVAL   Patient Name: Kimberly Ortega MRN: [id number] DOB:21-Apr-1966, 58 y.o., female Today's Date: 05/18/2024   Progress Note Reporting Period 04/07/24 to 05/18/24  See note below for Objective Data and Assessment of Progress/Goals.     END OF SESSION:  PT End of Session - 05/18/24 1437     Visit Number 5    Number of Visits 12    Date for PT Re-Evaluation 06/15/24    Authorization Type [place] MEDICAID [facility]    Authorization Time Period [employer] approved 12 visits from 05/11/2024-07/10/2024    Authorization - Visit Number 2    Authorization - Number of Visits 12    Progress Note Due on Visit 10    PT Start Time 1438    PT Stop Time 1517    PT Time Calculation (min) 39 min    Activity Tolerance Patient limited by pain;Patient tolerated treatment well    Behavior During Therapy [facility] for tasks assessed/performed          Past Medical History:  Diagnosis Date   Abdominal pain    Allergies    Anemia    Anxiety    Arthritis    Cancer (HCC)    skin   Complication of anesthesia 1991   dizziness; almost passed out, post op   Depression    Dyspnea    with exertion   Family history of breast cancer    Cousins on both sides of family   Fatty liver    GERD (gastroesophageal reflux disease) 03/01/2017   History of basal cell cancer    History of hiatal hernia    History of melanoma    History of melanoma    History of recurrent UTIs    Migraine    last botox  injection 04-13-23   Multiple nevi    Obstructive sleep apnea    Panic disorder 1995   Polycystic ovarian disease    Past Surgical History:  Procedure Laterality Date   BIOPSY  04/09/2017   Procedure: BIOPSY;  Surgeon: Golda Claudis PENNER, MD;  [facility];  Service: Endoscopy;;  gastric esophageal    BIOPSY  02/02/2023   Procedure: BIOPSY;  Surgeon: Eartha Angelia Sieving, MD;  [facility];  Service: Gastroenterology;;    CHOLECYSTECTOMY  1991   COLONOSCOPY  2016   DILATION AND CURETTAGE OF UTERUS     ESOPHAGOGASTRODUODENOSCOPY N/A 04/09/2017   Procedure: ESOPHAGOGASTRODUODENOSCOPY (EGD);  Surgeon: Golda Claudis PENNER, MD;  [facility];  Service: Endoscopy;  Laterality: N/A;  9:25   ESOPHAGOGASTRODUODENOSCOPY (EGD) WITH PROPOFOL  N/A 02/02/2023   Procedure: ESOPHAGOGASTRODUODENOSCOPY (EGD) WITH PROPOFOL ;  Surgeon: Eartha Angelia Sieving, MD;  [facility]: [facility];  Service: Gastroenterology;  Laterality: N/A;  2:30 pm, moved to 5/14 per Tanya   EYE SURGERY Left 10/23/2021   LAPAROSCOPIC GASTRIC BANDING  07/07/2011   banding removed but has two clamps   LAPAROSCOPIC REPAIR AND REMOVAL OF GASTRIC BAND     LUMBAR LAMINECTOMY/ DECOMPRESSION WITH MET-RX Right 02/09/2023   Procedure: Right Lumbar four-five Minimally Invasive Laminectomy, Discectomy;  Surgeon: Cheryle Debby LABOR, MD;  Location: [facility] OR;  Service: Neurosurgery;  Laterality: Right;   LUMBAR LAMINECTOMY/ DECOMPRESSION WITH MET-RX N/A 04/19/2023   Procedure: REVISION LUMBAR FOUR-LUMBAR FIVE MICRODISCECTOMY;  Surgeon: Cheryle Debby LABOR, MD;  Location: [facility] OR;  Service: Neurosurgery;  Laterality: N/A;  3C   UPPER GI ENDOSCOPY N/A 11/21/2012   Procedure: UPPER GI ENDOSCOPY /OPERATIVE DIAGNOSTIC LAPAROSCOPY /REMOVAL LAP  GASTRIC BAND/INSERTION OF DRAIN;  Surgeon: Donnice KATHEE Lunger, MD;  Location: [facility];  Service: General;  Laterality: N/A;   Patient Active Problem List   Diagnosis Date Noted   Stress incontinence of urine 04/18/2024   Disorder of right sciatic nerve 09/30/2023   Lumbar post-laminectomy syndrome 09/30/2023   Radiculopathy due to lumbar intervertebral disc disorder 09/30/2023   Lumbar radiculopathy 04/19/2023   OSA (obstructive sleep apnea) 03/30/2023   Migraine with aura and without status migrainosus, not intractable 12/14/2022   Right lumbar radiculopathy 12/14/2022   Chronic insomnia 12/14/2022   Nightmares 12/14/2022    Snoring 12/14/2022   Laryngopharyngeal reflux (LPR) 12/01/2022   Postural lightheadedness 07/16/2020   Family history of breast cancer 06/20/2020   Family history of pancreatic cancer 06/20/2020   IUD (intrauterine device) in place 06/20/2020   GERD (gastroesophageal reflux disease) 03/01/2017   Abdominal pain 03/01/2017   Gastroparesis 03/01/2017   LUQ abdominal pain 03/01/2017   Weight gain 03/01/2017   DOE (dyspnea on exertion) 07/20/2016   Erosion of gastric band-explantation March 2014 11/21/2012   Lapband APS with [facility] repair Oct 2012 10/22/2011    PCP: Rosamond Leta KATHEE, MD   REFERRING PROVIDER: Lonie Stephen Stank, MD  REFERRING DIAG: low back pain  Rationale for Evaluation and Treatment: Rehabilitation  THERAPY DIAG:  Other low back pain  Bilateral hip pain  Impaired functional mobility and activity tolerance  ONSET DATE: 04/19/2023  SUBJECTIVE:                                                                                                                                                                                           SUBJECTIVE STATEMENT: Pt reports 9/10 for low back pain today. Reports she did a lot of stairs and got ready to get here today.    EVAL: Pt states pain started out as sciatica went on for a year and started going down to foot. Pt thinks it was 2 laminectomy's, pt reports the doctor is no longer doing. Pt states she wants to get her life back, with not having to depend on back medications. Pt states she wishes she had never had the surgery in the first place. Pt went for a second opinion and they recommended therapy. Pt states she gained a lot of weight after the surgery. Pt wants to get back stronger where it won't give out.  PERTINENT HISTORY:  -History of 2 back surgery -RLE numbness and tingling  PAIN:  Are you having pain? Yes: NPRS scale: 9/10 average  Pain location: across low back mainly right side, bilateral hips to Pain  description: deep ache Aggravating  factors: shower, walking, bending Relieving factors: sitting and laying down  PRECAUTIONS: None  RED FLAGS: anxiety   WEIGHT BEARING RESTRICTIONS: No  FALLS:  Has patient fallen in last 6 months? Yes. Number of falls 1  OCCUPATION: disability  PLOF: Independent with basic ADLs  PATIENT GOALS: get her life back, be able to walk and have back not give out  NEXT MD VISIT: 3 months of therapy and after  OBJECTIVE:  Note: Objective measures were completed at Evaluation unless otherwise noted.  DIAGNOSTIC FINDINGS:  CLINICAL DATA:  Fluoroscopic assistance for lumbar spine surgery   EXAM: LUMBAR SPINE - 1 VIEW   COMPARISON:  10/21/2021   FINDINGS: Fluoroscopic assistance was provided for lumbar spine surgery. Fluoroscopic time 10.7 seconds. Radiation dose 9.85 mGy.   IMPRESSION: Fluoroscopic assistance was provided for lumbar spine surgery  PATIENT SURVEYS:  Modified Oswestry: 29/50  05/18/24: Modified Oswestry Low Back Pain Disability Questionnaire: 28 / 50 = 56.0 %  COGNITION: Overall cognitive status: Within functional limits for tasks assessed     SENSATION: Light touch: WFL  POSTURE: rounded shoulders, forward head, decreased lumbar lordosis, and flexed trunk   PALPATION: Pt demonstrates increased sensitivity to palpation to bilateral paraspinals of lumbar spine, and to L3-L5 spinal spinous processes.  LUMBAR ROM:   AROM eval 05/18/24  Flexion 28, low back, bilateral 50% avail, *  Extension 5, low back, worse pain 10% avail, **  Right lateral flexion 20 degrees, pain in hip and low back and mid back  25% avail, ~3 inch above knee, *  Left lateral flexion 15, right low back worst 25% avail , ~3 inch above knee, *  Right rotation 25% available 25% avail  Left rotation 75% available 25% avail   (Blank rows = not tested)    *=painful   **=most painful   LOWER EXTREMITY MMT:    MMT Right eval Left eval R 05/18/24  L 05/18/24  Hip flexion 4- 4 4 4   Hip extension 3- 3+ 4- 4-  Hip abduction 4- 4 4 4   Hip adduction 3 3+    Hip internal rotation      Hip external rotation      Knee flexion 4 4 4 4   Knee extension 4 4+ 4+ 4+  Ankle dorsiflexion 4 4 5 5   Ankle plantarflexion      Ankle inversion      Ankle eversion       (Blank rows = not tested)  LUMBAR SPECIAL TESTS:  Straight leg raise test: Positive  FUNCTIONAL TESTS:  5 times sit to stand: 17 seconds, no UE support, reports 7/10 pain on 05/01/24 2 minute walk test: 457 ft, pain stayed constant   05/18/24: 5xSTS: 15 seconds, no UE use 2 Minute walk test: 477', pain constant   GAIT: Distance walked: 80 feet to and from treatment area Assistive device utilized: None Level of assistance: Complete Independence Comments: antalgic gait pattern, decreased velocity, TBA further next session  TREATMENT DATE:  05/18/24: Progress Note: Modified Oswestry (MHP donned and LE wedge w/ pt in supine while discussing) Lumbar ROM LE MMT 5 Times Sit to Stand 2 Minute Walk Test Discussion/Print out given of LE wedge for at home use to dec pressure on low back when supine   05/16/24 Supine with MHP for 5 min on lower back prior exercise  -Decompression 1-5 5x 5  -Deep breathing with one hand of chest/ one on stomach x 2'  -TrA activation paired with exhalation x 8'  -  Log rolling  05/04/24 Supine  TA isometric contraction 10X5  TA contraction with march alternating 10X  SLR each side with TA cont 10X each LE  Bridge 10X3 holds LTR, 10x each side  Piriformis stretch bilaterally Hamstring stretch with towel 2X30 bil  PATIENT EDUCATION:  Education details: Pt was educated on findings of PT evaluation, prognosis, frequency of therapy visits and rationale, attendance policy, and HEP if given.   Person educated: Patient Education method: Explanation, Verbal cues, and Handouts Education comprehension: verbalized understanding, verbal cues  required, and needs further education  HOME EXERCISE PROGRAM: Access Code: [email or web address] URL: [email or web address] Date: 04/07/2024 Prepared by: Lang Ada  Exercises - Supine Lower Trunk Rotation  - 1 x daily - 7 x weekly - 3 sets - 10 reps - Supine Bridge  - 1 x daily - 7 x weekly - 3 sets - 10 reps - 3 hold - Neutral Curl Up with Straight Leg  - 1 x daily - 7 x weekly - 3 sets - 10 reps - 3 hold  Access Code: [email or web address] URL: [email or web address] Date: 05/01/2024 Prepared by: Rosaria Powell-Butler  Exercises - Seated Scapular Retraction  - 2 x daily - 7 x weekly - 3 sets - 10 reps - 10 hold  ASSESSMENT:  CLINICAL IMPRESSION: Patient continues to report increased low back pain, reporting 8-9/10 today. Re-evaluation/Progress note performed this date. Patient demonstrates very mild improvement with self perceived function via Modified Oswestry, little difference in lumbar ROM (limited by pain), improvements in LE strength, and improvements with functional testing indicating improved LE strength/power and endurance and gait speed. Patient has met one of six total goals. Patient given print out of LE wedge for in bed use for in supine position as this slightly relieves pt pain in session. Patient will benefit from continued skilled physical therapy in order to address pain, remaining deficits, and unmet goals in order to improve current function and quality of life.     EVAL: Patient is a 58 y.o. female who was seen today for physical therapy evaluation and treatment for low back pain.  Patient demonstrates increased bilateral back and hip pain, decreased LE strength, abnormal gait pattern, and impaired functional mobility. Patient also demonstrates difficulty with ambulation during today's session with decreased stride length and velocity noted. Patient also demonstrates abnormal sensitivity to palpation and mobilization of L3-L5 lumbar spinal segments. Patient  requires education on role of PT, prognosis, and importance of compliance with HEP. Patient would benefit from skilled physical therapy for decreased back pain, hip pain, increased endurance with ambulation, increased core/LE strength, and balance for improved gait quality, return to higher level of function with ADLs, and progress towards therapy goals.   OBJECTIVE IMPAIRMENTS: Abnormal gait, decreased activity tolerance, decreased balance, decreased endurance, decreased mobility, difficulty walking, decreased ROM, decreased strength, postural dysfunction, and pain.   ACTIVITY LIMITATIONS: carrying, lifting, bending, standing, squatting, sleeping, stairs, transfers, and bed mobility  PARTICIPATION LIMITATIONS: meal prep, cleaning, laundry, driving, shopping, community activity, and yard work  PERSONAL FACTORS: Age, Past/current experiences, Time since onset of injury/illness/exacerbation, and 1 comorbidity: 2 spinal surgeries are also affecting patient's functional outcome.   REHAB POTENTIAL: Fair, 2 spinal surgeries with no success  CLINICAL DECISION MAKING: Stable/uncomplicated  EVALUATION COMPLEXITY: Low   GOALS: Goals reviewed with patient? Yes  SHORT TERM GOALS: Target date: 06/15/24  Pt will be independent with HEP in order to demonstrate participation in Physical Therapy POC.  Baseline: Reports compliance with HEP, regardless of pain Goal status:  MET  2.  Pt will report 4/10 pain with mobility in order to demonstrate improved pain with ADLs.  Baseline: 9/10 on 05/18/24 Goal status: IN PROGRESS  LONG TERM GOALS: Target date: 06/15/24  Pt will improve 5TSTS by 2.3 seconds in order to demonstrate improved functional strength to return to desired activities.  Baseline: see objective.  Goal status: IN PROGRESS  2.  Pt will improve 2 MWT by 40 feet in order to demonstrate improved functional ambulatory capacity in community setting.  Baseline: see objective.  Goal status: IN  PROGRESS  3.  Pt will improve Modified Oswestry score by at least 6 points in order to demonstrate improved pain with functional goals and outcomes. Baseline: see objective.  Goal status: IN PROGRESS  4.  Pt will report 2/10 pain with mobility in order to demonstrate reduced pain with ADLs lasting greater than 30 minutes.  Baseline: see objective.  Goal status: IN PROGRESS   PLAN:  PT FREQUENCY: 1-2x/week  PT DURATION: 6 weeks  PLANNED INTERVENTIONS: 97110-Therapeutic exercises, 97530- Therapeutic activity, [id number]- Neuromuscular re-education, 97535- Self Care, [id number]- Manual therapy, [id number]- Gait training, Patient/Family education, Balance training, Spinal mobilization, DME instructions, Cryotherapy, and Moist heat.  PLAN FOR NEXT SESSION: progress core and proximal LE strength   4:22 PM, 05/18/24 Cannon Arreola Powell-Butler, PT, DPT [facility] [place] [facility]

## 2024-05-23 ENCOUNTER — Encounter (HOSPITAL_COMMUNITY): Admitting: Physical Therapy

## 2024-05-25 ENCOUNTER — Encounter (HOSPITAL_COMMUNITY)

## 2024-05-25 ENCOUNTER — Ambulatory Visit (HOSPITAL_COMMUNITY): Attending: Orthopedic Surgery | Admitting: Physical Therapy

## 2024-05-29 ENCOUNTER — Telehealth: Payer: Self-pay | Admitting: Internal Medicine

## 2024-05-29 ENCOUNTER — Telehealth: Payer: Self-pay | Admitting: *Deleted

## 2024-05-29 ENCOUNTER — Encounter (HOSPITAL_COMMUNITY)

## 2024-05-29 NOTE — Telephone Encounter (Signed)
 If I am not mistaken I have filled out 2 separate papers for this pt regarding pulse ox. I am not sure why or what is the hold up on giving this to pt. Feel free to send new fax and I will feel it out again with everything the I can.  Called pt and they answered but never spoke.

## 2024-05-29 NOTE — Telephone Encounter (Signed)
 Notified order is scanned into chart and has been faxed to [facility]. She says their telling her they haven't received order. She will check back with them. I told her to have them call office if they need it re faxed. NFN

## 2024-05-29 NOTE — Telephone Encounter (Signed)
 I checked the last order sent in for pt. Per he DME company, it was noted that,  If this was for us  we do not carry the Pulse ox's for purchase we only keep them in stock to perform Overnight testing when ordered. Please advise thank you   I called and spoke to pt. Pt stated that the DME company had figured out a way for the pt to purchase one through insurance as she is on disability, and the DME company was only waiting on our office to fill out a paper that they sent a month ago. I informed pt that I personally have not seen a paper, but I would check with the [facility] st location and if we did not have anything, I would check with the [place] office to see if they know anything about this. Pt stats that once the paper is filled out, she can have her pulse ox.   Routing to the [place] location to check on this. If they have not seen a paper for this, pt asks to be called and told so, so she can have the DME company fax over another paper.

## 2024-05-29 NOTE — Telephone Encounter (Signed)
 Copied from CRM [id number]. Topic: Clinical - Order For Equipment >> May 26, 2024  3:04 PM Corean SAUNDERS wrote: Reason for CRM: Patient states she has been waiting for a month for [facility] to fill, her order for  a pulse oximeter and states [facility] faxes a request to [facility] and patient is kindly requesting this be resolved for her.

## 2024-05-30 ENCOUNTER — Encounter (HOSPITAL_COMMUNITY)

## 2024-05-30 NOTE — Telephone Encounter (Signed)
Fax sent out today.

## 2024-05-31 ENCOUNTER — Encounter (HOSPITAL_COMMUNITY)

## 2024-06-01 ENCOUNTER — Encounter (HOSPITAL_COMMUNITY)

## 2024-06-05 ENCOUNTER — Telehealth: Payer: Self-pay | Admitting: Pharmacy Technician

## 2024-06-05 ENCOUNTER — Other Ambulatory Visit (HOSPITAL_COMMUNITY): Payer: Self-pay

## 2024-06-05 NOTE — Telephone Encounter (Signed)
 Pharmacy Patient Advocate Encounter   Received notification from CoverMyMeds that prior authorization for Ubrelvy  100mg  is due for renewal.   Insurance verification completed.   The patient is insured through [facility] MEDICAID.  Action: PA required; PA submitted to above mentioned insurance via Latent Key/confirmation #/EOC [id number] Status is pending

## 2024-06-06 ENCOUNTER — Other Ambulatory Visit (HOSPITAL_COMMUNITY): Payer: Self-pay

## 2024-06-06 ENCOUNTER — Ambulatory Visit: Admitting: Physician Assistant

## 2024-06-06 NOTE — Telephone Encounter (Signed)
 Pharmacy Patient Advocate Encounter  Received notification from [facility] MEDICAID that Prior Authorization for Ubrelvy  100mg  has been APPROVED from 06/05/24 to 06/05/25. Ran test claim, Copay is $4. This test claim was processed through [facility]- copay amounts may vary at other pharmacies due to pharmacy/plan contracts, or as the patient moves through the different stages of their insurance plan.   PA #/Case ID/Reference #: [id number]

## 2024-06-14 ENCOUNTER — Encounter (HOSPITAL_COMMUNITY): Admitting: Physical Therapy

## 2024-06-14 ENCOUNTER — Encounter (HOSPITAL_COMMUNITY): Payer: Self-pay

## 2024-06-19 ENCOUNTER — Other Ambulatory Visit: Payer: Self-pay

## 2024-06-19 ENCOUNTER — Ambulatory Visit: Admitting: Physician Assistant

## 2024-06-19 MED ORDER — TRETINOIN 0.1 % EX CREA: TOPICAL_CREAM | Freq: Every day | CUTANEOUS | 3 refills | Status: AC

## 2024-06-20 ENCOUNTER — Ambulatory Visit (HOSPITAL_COMMUNITY): Admitting: Clinical

## 2024-06-20 ENCOUNTER — Telehealth: Payer: Self-pay | Admitting: Family Medicine

## 2024-06-20 DIAGNOSIS — G43109 Migraine with aura, not intractable, without status migrainosus: Secondary | ICD-10-CM

## 2024-06-20 NOTE — Telephone Encounter (Signed)
 Submitted auth request via CMM to switch pt over to [facility], status is pending. Key: [id number]

## 2024-06-20 NOTE — Telephone Encounter (Signed)
 Received notice that Kimberly Ortega was denied, Kimberly Ortega is stating there wasn't documentation that pt had 15 or more headache days a month before starting Botox . I completed appeal form and faxed with Amy's signature and OV notes to [phone]. Requested expedited appeal.

## 2024-06-21 ENCOUNTER — Other Ambulatory Visit: Payer: Self-pay

## 2024-06-21 ENCOUNTER — Other Ambulatory Visit (HOSPITAL_COMMUNITY): Payer: Self-pay

## 2024-06-21 MED ORDER — ONABOTULINUMTOXINA 200 UNITS IJ SOLR
INTRAMUSCULAR | 2 refills | Status: AC
  Filled 2024-06-21: qty 1, fill #0
  Filled 2024-06-27: qty 1, 84d supply, fill #0
  Filled 2024-10-12: qty 1, 84d supply, fill #1

## 2024-06-21 NOTE — Telephone Encounter (Signed)
 Received fax that denial was overturned and shara is approved, please send rx to [facility].  Auth#: [id number] (06/20/24-12/20/24)

## 2024-06-21 NOTE — Progress Notes (Signed)
 Patient to be enrolled with [facility]. Routed to Rx Prior Auth Team (ATTN: Monica).

## 2024-06-21 NOTE — Telephone Encounter (Signed)
 Faxed requested additional notes to [phone].

## 2024-06-21 NOTE — Addendum Note (Signed)
 Addended by: JOSHUA MAURILIO CROME on: 06/21/2024 01:53 PM   Modules accepted: Orders

## 2024-06-22 ENCOUNTER — Encounter (HOSPITAL_COMMUNITY)

## 2024-06-22 ENCOUNTER — Other Ambulatory Visit (HOSPITAL_COMMUNITY): Payer: Self-pay

## 2024-06-22 ENCOUNTER — Other Ambulatory Visit: Payer: Self-pay

## 2024-06-27 ENCOUNTER — Other Ambulatory Visit: Payer: Self-pay

## 2024-06-27 ENCOUNTER — Other Ambulatory Visit (HOSPITAL_COMMUNITY): Payer: Self-pay

## 2024-06-27 ENCOUNTER — Encounter (HOSPITAL_COMMUNITY): Payer: Self-pay

## 2024-06-27 NOTE — Progress Notes (Unsigned)
 Specialty Pharmacy Initial Fill Coordination Note  Arwyn Besaw is a 58 y.o. female contacted today regarding initial fill of specialty medication(s) OnabotulinumtoxinA  (BOTOX )   Patient requested Courier to Provider Office   Delivery date: 07/05/24   Verified address: [facility], [address], [place], [place] [address]   Medication will be filled on 07/03/2024.   Patient is aware of 4.00 copayment.

## 2024-06-28 ENCOUNTER — Other Ambulatory Visit (HOSPITAL_COMMUNITY): Payer: Self-pay

## 2024-06-29 ENCOUNTER — Ambulatory Visit (HOSPITAL_COMMUNITY): Attending: Orthopedic Surgery | Admitting: Physical Therapy

## 2024-06-29 DIAGNOSIS — M25552 Pain in left hip: Secondary | ICD-10-CM | POA: Diagnosis present

## 2024-06-29 DIAGNOSIS — M25551 Pain in right hip: Secondary | ICD-10-CM | POA: Insufficient documentation

## 2024-06-29 DIAGNOSIS — Z7409 Other reduced mobility: Secondary | ICD-10-CM | POA: Diagnosis present

## 2024-06-29 DIAGNOSIS — M5459 Other low back pain: Secondary | ICD-10-CM | POA: Diagnosis present

## 2024-06-29 NOTE — Therapy (Addendum)
 OUTPATIENT PHYSICAL THERAPY THORACOLUMBAR TREATMENT Progress Note/recert Reporting Period 05/18/24 to 06/29/24  See note below for Objective Data and Assessment of Progress/Goals.       Patient Name: Kimberly Ortega MRN: [id number] DOB:30-Nov-1965, 58 y.o., female Today's Date: 06/29/2024   END OF SESSION:  PT End of Session - 06/29/24 1711     Visit Number 6    Number of Visits 12    Date for Recertification  06/15/24    Authorization Type [place] MEDICAID [facility]    Authorization Time Period [employer] approved 12 visits from 05/11/2024-07/10/2024    Authorization - Visit Number 3    Authorization - Number of Visits 12    Progress Note Due on Visit 10    PT Start Time 0305    PT Stop Time 0355    PT Time Calculation (min) 50 min    Activity Tolerance Patient limited by pain;Patient tolerated treatment well    Behavior During Therapy [facility] for tasks assessed/performed           Past Medical History:  Diagnosis Date   Abdominal pain    Allergies    Anemia    Anxiety    Arthritis    Cancer (HCC)    skin   Complication of anesthesia 1991   dizziness; almost passed out, post op   Depression    Dyspnea    with exertion   Family history of breast cancer    Cousins on both sides of family   Fatty liver    GERD (gastroesophageal reflux disease) 03/01/2017   History of basal cell cancer    History of hiatal hernia    History of melanoma    History of melanoma    History of recurrent UTIs    Migraine    last botox  injection 04-13-23   Multiple nevi    Obstructive sleep apnea    Panic disorder 1995   Polycystic ovarian disease    Past Surgical History:  Procedure Laterality Date   BIOPSY  04/09/2017   Procedure: BIOPSY;  Surgeon: Golda Claudis PENNER, MD;  [facility]: [facility];  Service: Endoscopy;;  gastric esophageal    BIOPSY  02/02/2023   Procedure: BIOPSY;  Surgeon: Eartha Angelia Sieving, MD;  Location: [facility];  Service: Gastroenterology;;    CHOLECYSTECTOMY  1991   COLONOSCOPY  2016   DILATION AND CURETTAGE OF UTERUS     ESOPHAGOGASTRODUODENOSCOPY N/A 04/09/2017   Procedure: ESOPHAGOGASTRODUODENOSCOPY (EGD);  Surgeon: Golda Claudis PENNER, MD;  [facility]: [facility];  Service: Endoscopy;  Laterality: N/A;  9:25   ESOPHAGOGASTRODUODENOSCOPY (EGD) WITH PROPOFOL  N/A 02/02/2023   Procedure: ESOPHAGOGASTRODUODENOSCOPY (EGD) WITH PROPOFOL ;  Surgeon: Eartha Angelia Sieving, MD;  [facility]: [facility];  Service: Gastroenterology;  Laterality: N/A;  2:30 pm, moved to 5/14 per Tanya   EYE SURGERY Left 10/23/2021   LAPAROSCOPIC GASTRIC BANDING  07/07/2011   banding removed but has two clamps   LAPAROSCOPIC REPAIR AND REMOVAL OF GASTRIC BAND     LUMBAR LAMINECTOMY/ DECOMPRESSION WITH MET-RX Right 02/09/2023   Procedure: Right Lumbar four-five Minimally Invasive Laminectomy, Discectomy;  Surgeon: Cheryle Debby LABOR, MD;  Location: [facility] OR;  Service: Neurosurgery;  Laterality: Right;   LUMBAR LAMINECTOMY/ DECOMPRESSION WITH MET-RX N/A 04/19/2023   Procedure: REVISION LUMBAR FOUR-LUMBAR FIVE MICRODISCECTOMY;  Surgeon: Cheryle Debby LABOR, MD;  Location: [facility] OR;  Service: Neurosurgery;  Laterality: N/A;  3C   UPPER GI ENDOSCOPY N/A 11/21/2012   Procedure: UPPER GI ENDOSCOPY ULUS DIAGNOSTIC LAPAROSCOPY /REMOVAL  LAP GASTRIC BAND/INSERTION OF DRAIN;  Surgeon: Donnice KATHEE Lunger, MD;  Location: [facility] OR[facility];  Service: General;  Laterality: N/A;   Patient Active Problem List   Diagnosis Date Noted   Stress incontinence of urine 04/18/2024   Disorder of right sciatic nerve 09/30/2023   Lumbar post-laminectomy syndrome 09/30/2023   Radiculopathy due to lumbar intervertebral disc disorder 09/30/2023   Lumbar radiculopathy 04/19/2023   OSA (obstructive sleep apnea) 03/30/2023   Migraine with aura and without status migrainosus, not intractable 12/14/2022   Right lumbar radiculopathy 12/14/2022   Chronic insomnia 12/14/2022   Nightmares 12/14/2022    Snoring 12/14/2022   Laryngopharyngeal reflux (LPR) 12/01/2022   Postural lightheadedness 07/16/2020   Family history of breast cancer 06/20/2020   Family history of pancreatic cancer 06/20/2020   IUD (intrauterine device) in place 06/20/2020   GERD (gastroesophageal reflux disease) 03/01/2017   Abdominal pain 03/01/2017   Gastroparesis 03/01/2017   LUQ abdominal pain 03/01/2017   Weight gain 03/01/2017   DOE (dyspnea on exertion) 07/20/2016   Erosion of gastric band-explantation March 2014 11/21/2012   Lapband APS with [facility] repair Oct 2012 10/22/2011    PCP: Rosamond Leta KATHEE, MD   REFERRING PROVIDER: Lonie Stephen Stank, MD  REFERRING DIAG: low back pain  Rationale for Evaluation and Treatment: Rehabilitation  THERAPY DIAG:  Other low back pain  Bilateral hip pain  Impaired functional mobility and activity tolerance  ONSET DATE: 04/19/2023  SUBJECTIVE:                                                                                                                                                                                           SUBJECTIVE STATEMENT: Pt reports no subjective improvement thus far.  States she is unable to make the appointments because she has so many other appointments she has to juggle around.  Pain is always higher than 8/10.  9/10 for low back pain today. Reports she's been doing the HEP at home.  Been doing a little walking but not consistent.  Reports she now has sciatica going down bil LE's and has pain in upper back as well as lower back.      EVAL: Pt states pain started out as sciatica went on for a year and started going down to foot. Pt thinks it was 2 laminectomy's, pt reports the doctor is no longer doing. Pt states she wants to get her life back, with not having to depend on back medications. Pt states she wishes she had never had the surgery in the first place. Pt went for a second opinion and they recommended therapy. Pt states she gained  a  lot of weight after the surgery. Pt wants to get back stronger where it won't give out.  PERTINENT HISTORY:  -History of 2 back surgery -RLE numbness and tingling  PAIN:  Are you having pain? Yes: NPRS scale: 9/10 average  Pain location: across low back mainly right side, bilateral hips to Pain description: deep ache Aggravating factors: shower, walking, bending Relieving factors: sitting and laying down  PRECAUTIONS: None  RED FLAGS: anxiety   WEIGHT BEARING RESTRICTIONS: No  FALLS:  Has patient fallen in last 6 months? Yes. Number of falls 1  OCCUPATION: disability  PLOF: Independent with basic ADLs  PATIENT GOALS: get her life back, be able to walk and have back not give out  NEXT MD VISIT: 3 months of therapy and after  OBJECTIVE:  Note: Objective measures were completed at Evaluation unless otherwise noted.  DIAGNOSTIC FINDINGS:  CLINICAL DATA:  Fluoroscopic assistance for lumbar spine surgery   EXAM: LUMBAR SPINE - 1 VIEW   COMPARISON:  10/21/2021   FINDINGS: Fluoroscopic assistance was provided for lumbar spine surgery. Fluoroscopic time 10.7 seconds. Radiation dose 9.85 mGy.   IMPRESSION: Fluoroscopic assistance was provided for lumbar spine surgery  PATIENT SURVEYS:  Modified Oswestry: 29/50  05/18/24: Modified Oswestry Low Back Pain Disability Questionnaire: 28 / 50 = 56.0 %  COGNITION: Overall cognitive status: Within functional limits for tasks assessed     SENSATION: Light touch: WFL  POSTURE: rounded shoulders, forward head, decreased lumbar lordosis, and flexed trunk   PALPATION: Pt demonstrates increased sensitivity to palpation to bilateral paraspinals of lumbar spine, and to L3-L5 spinal spinous processes.  LUMBAR ROM:   AROM eval 05/18/24  Flexion 28, low back, bilateral 50% avail, *  Extension 5, low back, worse pain 10% avail, **  Right lateral flexion 20 degrees, pain in hip and low back and mid back  25% avail, ~3 inch above  knee, *  Left lateral flexion 15, right low back worst 25% avail , ~3 inch above knee, *  Right rotation 25% available 25% avail  Left rotation 75% available 25% avail   (Blank rows = not tested)    *=painful   **=most painful   LOWER EXTREMITY MMT:    MMT Right eval Left eval R 05/18/24 L 05/18/24  Hip flexion 4- 4 4 4   Hip extension 3- 3+ 4- 4-  Hip abduction 4- 4 4 4   Hip adduction 3 3+    Hip internal rotation      Hip external rotation      Knee flexion 4 4 4 4   Knee extension 4 4+ 4+ 4+  Ankle dorsiflexion 4 4 5 5   Ankle plantarflexion      Ankle inversion      Ankle eversion       (Blank rows = not tested)  LUMBAR SPECIAL TESTS:  Straight leg raise test: Positive  FUNCTIONAL TESTS:  5 times sit to stand: 17 seconds, no UE support, reports 7/10 pain on 05/01/24 2 minute walk test: 457 ft, pain stayed constant   05/18/24: 5xSTS: 15 seconds, no UE use 2 Minute walk test: 477', pain constant   GAIT: Distance walked: 80 feet to and from treatment area Assistive device utilized: None Level of assistance: Complete Independence Comments: antalgic gait pattern, decreased velocity, TBA further next session  TREATMENT DATE:  06/29/24 Supine with MHP for 5 min on lower back prior exercise (15 minutes with education on course of treatment/therapy progression)  Bridge with minimal rise  10X5  Heelslides bil 10X each  LTR 10X each side Nustep EOS seat 6 UE/LE level 3 [place] , 10 minutes at EOS      05/18/24: Progress Note: Modified Oswestry (MHP donned and LE wedge w/ pt in supine while discussing) Lumbar ROM LE MMT 5 Times Sit to Stand 2 Minute Walk Test Discussion/Print out given of LE wedge for at home use to dec pressure on low back when supine   05/16/24 Supine with MHP for 5 min on lower back prior exercise  -Decompression 1-5 5x 5  -Deep breathing with one hand of chest/ one on stomach x 2'  -TrA activation paired with exhalation x 8'  -Log  rolling  05/04/24 Supine  TA isometric contraction 10X5  TA contraction with march alternating 10X  SLR each side with TA cont 10X each LE  Bridge 10X3 holds LTR, 10x each side  Piriformis stretch bilaterally Hamstring stretch with towel 2X30 bil  PATIENT EDUCATION:  Education details: Pt was educated on findings of PT evaluation, prognosis, frequency of therapy visits and rationale, attendance policy, and HEP if given.   Person educated: Patient Education method: Explanation, Verbal cues, and Handouts Education comprehension: verbalized understanding, verbal cues required, and needs further education  HOME EXERCISE PROGRAM: Access Code: [email or web address] URL: [email or web address] Date: 04/07/2024 Prepared by: Lang Ada  Exercises - Supine Lower Trunk Rotation  - 1 x daily - 7 x weekly - 3 sets - 10 reps - Supine Bridge  - 1 x daily - 7 x weekly - 3 sets - 10 reps - 3 hold - Neutral Curl Up with Straight Leg  - 1 x daily - 7 x weekly - 3 sets - 10 reps - 3 hold  Access Code: [email or web address] URL: [email or web address] Date: 05/01/2024 Prepared by: Rosaria Powell-Butler  Exercises - Seated Scapular Retraction  - 2 x daily - 7 x weekly - 3 sets - 10 reps - 10 hold  ASSESSMENT:  CLINICAL IMPRESSION: Pt requested moist heat.  Spent first 15 minutes educating on importance of maintaining consistent participation with therapy.  Pt has attended 6 total visits over her 3 month course of therapy.   Pt asked to make therapy priority as all other appts, just 1X weekly.  Pt verbalized understanding and is goin gto work on progression.  Encouraged pt to increase walking at home and progressing exercises/activities.  Pt was able to complete all exercises as instructed without c/o pain today.  Was previously unable to complete bridge but today was able to lift minimally today.  Ended session with nustep to work on mobility and activity tolerance.  Pt will continue to beneift  from continued skilled physical therapy in order to address pain, remaining deficits, and unmet goals in order to improve current function and quality of life.     EVAL: Patient is a 58 y.o. female who was seen today for physical therapy evaluation and treatment for low back pain.  Patient demonstrates increased bilateral back and hip pain, decreased LE strength, abnormal gait pattern, and impaired functional mobility. Patient also demonstrates difficulty with ambulation during today's session with decreased stride length and velocity noted. Patient also demonstrates abnormal sensitivity to palpation and mobilization of L3-L5 lumbar spinal segments. Patient requires education on role of PT, prognosis, and importance of compliance with HEP. Patient would benefit from skilled physical therapy for decreased back pain, hip pain, increased endurance with ambulation, increased core/LE strength, and balance for improved gait quality, return to higher level of function  with ADLs, and progress towards therapy goals.   OBJECTIVE IMPAIRMENTS: Abnormal gait, decreased activity tolerance, decreased balance, decreased endurance, decreased mobility, difficulty walking, decreased ROM, decreased strength, postural dysfunction, and pain.   ACTIVITY LIMITATIONS: carrying, lifting, bending, standing, squatting, sleeping, stairs, transfers, and bed mobility  PARTICIPATION LIMITATIONS: meal prep, cleaning, laundry, driving, shopping, community activity, and yard work  PERSONAL FACTORS: Age, Past/current experiences, Time since onset of injury/illness/exacerbation, and 1 comorbidity: 2 spinal surgeries are also affecting patient's functional outcome.   REHAB POTENTIAL: Fair, 2 spinal surgeries with no success  CLINICAL DECISION MAKING: Stable/uncomplicated  EVALUATION COMPLEXITY: Low   GOALS: Goals reviewed with patient? Yes  SHORT TERM GOALS: Target date: 06/15/24  Pt will be independent with HEP in order to  demonstrate participation in Physical Therapy POC.  Baseline: Reports compliance with HEP, regardless of pain Goal status: MET  2.  Pt will report 4/10 pain with mobility in order to demonstrate improved pain with ADLs.  Baseline: 9/10 on 05/18/24 Goal status: IN PROGRESS  LONG TERM GOALS: Target date: 06/15/24  Pt will improve 5TSTS by 2.3 seconds in order to demonstrate improved functional strength to return to desired activities.  Baseline: see objective.  Goal status: IN PROGRESS  2.  Pt will improve 2 MWT by 40 feet in order to demonstrate improved functional ambulatory capacity in community setting.  Baseline: see objective.  Goal status: IN PROGRESS  3.  Pt will improve Modified Oswestry score by at least 6 points in order to demonstrate improved pain with functional goals and outcomes. Baseline: see objective.  Goal status: IN PROGRESS  4.  Pt will report 2/10 pain with mobility in order to demonstrate reduced pain with ADLs lasting greater than 30 minutes.  Baseline: see objective.  Goal status: IN PROGRESS   PLAN:  PT FREQUENCY: 1-2x/week  PT DURATION: 4 weeks  PLANNED INTERVENTIONS: 97110-Therapeutic exercises, 97530- Therapeutic activity, [id number]- Neuromuscular re-education, 97535- Self Care, 02859- Manual therapy, [phone]- Gait training, Patient/Family education, Balance training, Spinal mobilization, DME instructions, Cryotherapy, and Moist heat.  PLAN FOR NEXT SESSION: progress core and proximal LE strength; Continue pending ability to make future appts at least 1X weekly and showing progress.    5:12 PM, 06/29/24 Greig KATHEE Fuse, PTA/CLT [facility] Ph: [phone]

## 2024-06-30 ENCOUNTER — Encounter: Payer: Self-pay | Admitting: Family Medicine

## 2024-07-04 ENCOUNTER — Other Ambulatory Visit: Payer: Self-pay

## 2024-07-05 ENCOUNTER — Encounter (INDEPENDENT_AMBULATORY_CARE_PROVIDER_SITE_OTHER): Payer: Self-pay | Admitting: Gastroenterology

## 2024-07-06 ENCOUNTER — Encounter (HOSPITAL_COMMUNITY)

## 2024-07-13 ENCOUNTER — Ambulatory Visit: Admitting: Family Medicine

## 2024-07-17 ENCOUNTER — Telehealth: Payer: Self-pay | Admitting: Family Medicine

## 2024-07-17 NOTE — Telephone Encounter (Signed)
 Mychart

## 2024-07-17 NOTE — Progress Notes (Unsigned)
 07/18/24 ALL: Kimberly Ortega returns for Botox . She continues to do well. Migraines are well managed. She continues Ubrelvy  for abortive therapy.   04/18/2024 ALL: Kimberly Ortega returns for Botox . Baseline 15+ headache days with at least 1/2 being migrainous and can last up to 72 hours. She reports 75% improvement on Botox . Migraines seem well managed until the last 4-5 weeks. Taking 3-4 Ubrelvy  during first 6-8 weeks then using 8-9 tablets the last month. She is going through a divorce. She is seeing a veterinary surgeon.   01/20/2024 ALL: Kimberly Ortega returns for Botox . Migraines remain fairly well managed. She does report worsening 2-4 weeks prior to next procedure. She continues Ubrelvy  for abortive therapy and it works well.   10/21/2023 ALL: Kimberly Ortega returns for Botox . She continues to do well. Migraines are well managed until 1-2 weeks before next procedure. Ubrelvy  helps with abortive therapy.   07/06/2023 ALL: Kimberly Ortega returns for Botox . She continues to do well. Migraines wax and wane but usually fairly well managed. She uses Ubrelvy  for abortive therapy. Usually takes about 6-8 tablets a month.   04/13/2023 ALL: Kimberly Ortega returns for Botox . She is doing well on therapy. Ubrelvy  works well. She was started on CPAP therapy but unsure if it has been beneficial. Compliance review with Dr Chalice 03/30/2023 showed excellent compliance and residual AHI well managed.   01/18/2023 ALL: Kimberly Ortega returns for Botox . She reports doing better. Averaging about 6 migraine days a month. Ubrelvy  does seem to help. She is averaging 3-4 doses per month. Rarely using rizatriptan .   10/22/2022 ALL: Kimberly Ortega returns for Botox .She reports migraines have improved by at least 50%. Nurtec did not seem much more effective than rizatriptan .   07/27/2022 ALL: Kimberly Ortega presents for her first Botox  procedure. Baseline 15 migraine days per month. She was previously taking amitriptyline  20mg  at bedtime and propranolol  LA 60mg  daily. She discontinued both propranolol   and amitriptyline  a few weeks ago. She continues rizatriptan  as needed.     Consent Form Botulism Toxin Injection For Chronic Migraine    Reviewed orally with patient, additionally signature is on file:  Botulism toxin has been approved by the Federal drug administration for treatment of chronic migraine. Botulism toxin does not cure chronic migraine and it may not be effective in some patients.  The administration of botulism toxin is accomplished by injecting a small amount of toxin into the muscles of the neck and head. Dosage must be titrated for each individual. Any benefits resulting from botulism toxin tend to wear off after 3 months with a repeat injection required if benefit is to be maintained. Injections are usually done every 3-4 months with maximum effect peak achieved by about 2 or 3 weeks. Botulism toxin is expensive and you should be sure of what costs you will incur resulting from the injection.  The side effects of botulism toxin use for chronic migraine may include:   -Transient, and usually mild, facial weakness with facial injections  -Transient, and usually mild, head or neck weakness with head/neck injections  -Reduction or loss of forehead facial animation due to forehead muscle weakness  -Eyelid drooping  -Dry eye  -Pain at the site of injection or bruising at the site of injection  -Double vision  -Potential unknown long term risks   Contraindications: You should not have Botox  if you are pregnant, nursing, allergic to albumin, have an infection, skin condition, or muscle weakness at the site of the injection, or have myasthenia gravis, Lambert-Eaton syndrome, or ALS.  It is also  possible that as with any injection, there may be an allergic reaction or no effect from the medication. Reduced effectiveness after repeated injections is sometimes seen and rarely infection at the injection site may occur. All care will be taken to prevent these side effects. If  therapy is given over a long time, atrophy and wasting in the muscle injected may occur. Occasionally the patient's become refractory to treatment because they develop antibodies to the toxin. In this event, therapy needs to be modified.  I have read the above information and consent to the administration of botulism toxin.    BOTOX  PROCEDURE NOTE FOR MIGRAINE HEADACHE  Contraindications and precautions discussed with patient(above). Aseptic procedure was observed and patient tolerated procedure. Procedure performed by Greig Forbes, FNP-C.   The condition has existed for more than 6 months, and pt does not have a diagnosis of ALS, Myasthenia Gravis or Lambert-Eaton Syndrome.  Risks and benefits of injections discussed and pt agrees to proceed with the procedure.  Written consent obtained  These injections are medically necessary. Pt  receives good benefits from these injections. These injections do not cause sedations or hallucinations which the oral therapies may cause.   Description of procedure:  The patient was placed in a sitting position. The standard protocol was used for Botox  as follows, with 5 units of Botox  injected at each site:  -Procerus muscle, midline injection  -Corrugator muscle, bilateral injection  -Frontalis muscle, bilateral injection, with 2 sites each side, medial injection was performed in the upper one third of the frontalis muscle, in the region vertical from the medial inferior edge of the superior orbital rim. The lateral injection was again in the upper one third of the forehead vertically above the lateral limbus of the cornea, 1.5 cm lateral to the medial injection site.  -Temporalis muscle injection, 4 sites, bilaterally. The first injection was 3 cm above the tragus of the ear, second injection site was 1.5 cm to 3 cm up from the first injection site in line with the tragus of the ear. The third injection site was 1.5-3 cm forward between the first 2 injection  sites. The fourth injection site was 1.5 cm posterior to the second injection site. 5th site laterally in the temporalis  muscleat the level of the outer canthus.  -Occipitalis muscle injection, 3 sites, bilaterally. The first injection was done one half way between the occipital protuberance and the tip of the mastoid process behind the ear. The second injection site was done lateral and superior to the first, 1 fingerbreadth from the first injection. The third injection site was 1 fingerbreadth superiorly and medially from the first injection site.  -Cervical paraspinal muscle injection, 2 sites, bilaterally. The first injection site was 1 cm from the midline of the cervical spine, 3 cm inferior to the lower border of the occipital protuberance. The second injection site was 1.5 cm superiorly and laterally to the first injection site.  -Trapezius muscle injection was performed at 3 sites, bilaterally. The first injection site was in the upper trapezius muscle halfway between the inflection point of the neck, and the acromion. The second injection site was one half way between the acromion and the first injection site. The third injection was done between the first injection site and the inflection point of the neck.   Will return for repeat injection in 3 months.   A total of 200 units of Botox  was prepared, 155 units of Botox  was injected as documented above, any Botox   not injected was wasted. The patient tolerated the procedure well, there were no complications of the above procedure.

## 2024-07-18 ENCOUNTER — Ambulatory Visit: Admitting: Family Medicine

## 2024-07-18 ENCOUNTER — Encounter: Payer: Self-pay | Admitting: Family Medicine

## 2024-07-18 DIAGNOSIS — G43E09 Chronic migraine with aura, not intractable, without status migrainosus: Secondary | ICD-10-CM | POA: Diagnosis not present

## 2024-07-18 DIAGNOSIS — G43109 Migraine with aura, not intractable, without status migrainosus: Secondary | ICD-10-CM

## 2024-07-18 MED ORDER — ONABOTULINUMTOXINA 200 UNITS IJ SOLR
155.0000 [IU] | Freq: Once | INTRAMUSCULAR | Status: AC
  Administered 2024-07-18: 155 [IU] via INTRAMUSCULAR

## 2024-07-18 NOTE — Progress Notes (Signed)
 Botox - 200 units x 1 vial Lot: [id number] Expiration: 2027/11 NDC: 0023-3921-02  Bacteriostatic 0.9% Sodium Chloride - 4 mL  Lot: [id number] Expiration: 07/21/25 NDC: 9590803397  Dx: H56.890  S/P  Witnessed by JINNY ROYS CMA

## 2024-07-20 ENCOUNTER — Encounter (HOSPITAL_COMMUNITY): Admitting: Physical Therapy

## 2024-07-20 ENCOUNTER — Encounter (HOSPITAL_COMMUNITY): Payer: Self-pay

## 2024-07-25 ENCOUNTER — Telehealth: Admitting: Family Medicine

## 2024-07-26 NOTE — Addendum Note (Signed)
 Addended by: LUDGER HEART E on: 07/26/2024 08:10 AM   Modules accepted: Orders

## 2024-07-27 ENCOUNTER — Encounter (HOSPITAL_COMMUNITY): Admitting: Physical Therapy

## 2024-07-27 NOTE — Therapy (Signed)
 PHYSICAL THERAPY DISCHARGE SUMMARY  Visits from Start of Care: 6  Current functional level related to goals / functional outcomes: See last visit on 06/29/24   Remaining deficits: See last visit on 06/29/24   Education / Equipment: HEP   Patient agrees to discharge. Patient goals were not met. Patient is being discharged due to lack of progress. Pt unable to commit to PT appointments with consistent attendance.  9:21 AM, 07/27/24 Rosaria Settler, PT, DPT [facility] [place]

## 2024-08-02 ENCOUNTER — Encounter: Payer: Self-pay | Admitting: Family Medicine

## 2024-08-02 ENCOUNTER — Telehealth: Payer: Self-pay | Admitting: *Deleted

## 2024-08-02 NOTE — Telephone Encounter (Signed)
 I called pt after her email, needing to speak to nurse.  She relayed that she needs specific criteria for her to get approval for zepbound from her insurance.  She is asking what her BMI was, sleeping positions  (not sure if whe had study or education post study).  I told her multiple times that she can have records of her sleep study and office notes that would give her information to look for in regards to what they needed specific. We do not prescribe zepbound.  She mentioned speaking to Amy NP when in last for botox  and that she would be glad to assist her in this.   Hopefully it is there for her.   There was some urgency on her part due to time sensitivity.  She wanted [place], NP who is in her pcp office. To have this.

## 2024-08-03 ENCOUNTER — Encounter (HOSPITAL_COMMUNITY)

## 2024-08-07 NOTE — Progress Notes (Signed)
 PATIENT: Kimberly Ortega DOB: Nov 22, 1965  REASON FOR VISIT: follow up HISTORY FROM: patient  Virtual Visit via Telephone Note  I connected with Kimberly Ortega on 08/14/24 at  9:45 AM EST by telephone and verified that I am speaking with the correct person using two identifiers.   I discussed the limitations, risks, security and privacy concerns of performing an evaluation and management service by telephone and the availability of in person appointments. I also discussed with the patient that there may be a patient responsible charge related to this service. The patient expressed understanding and agreed to proceed.   History of Present Illness:  08/14/24 ALL: [name] returns for follow up for OSA and migraines. She continues to do well on CPAP therapy. She is using therapy most nights. She endorses more difficulty with nasal congestion which has limited her use, recently. She does feel benefit of using CPAP. She is needing new tubing. She is working closely with her PCP for weight management. She is trying to get Zepbound covered.   Migraines remain well managed on Botox  and Ubrelvy . She may have 3-4 headache days a month. She endorses more stress and has been crying more which triggers headaches. She feels Ubrelvy  works well when needed.   06/21/2023 ALL:  Kimberly Ortega is a 58 y.o. female here today for follow up. She was diagnosed with OSA following HST in 12/2022. Compliance visit with Dr Chalice 03/2023 showed excellent compliance and AHI well managed. She has switched insurance payers who is requiring she repeat HST. She is doing well on therapy. She continues to have significant fatigue and headaches. She is going through a divorce and endorses more stress.   History (copied from Dr Dohmeier's previous note)   Kimberly Ortega is a 58 y.o. female patient who is seen upon referral on 12/14/2022 from [place] Yeats/NP - Dr Margaret  for a Sleep Consult.   The patient has back pain now for  a year- and gained weight, is physically inactive, snores more. Night mares - moaning , groaning in her sleep- dreaming at the very onset of sleep- Obesity, chronic Migraine, snoring , hypnopompic hallucination, mind is racing, planned back surgery for sciatica soon.  Weight loss on optavia in 2019-2020  She is suffering from chronic insomnia.  She is suffering from anxiety and panic disorder.  Patient has been seen by Dr. Donita 03-20-2020 for new onset headache and again 08-28-2022 for a sciatica back pain, now further worked up by Dr. Cheryle.  Had on May 21 st her expected  back surgery.  Epworth was 2/ 24 and FSS was  58/ 63 points (!)      Social history: not gainfully employed at this time, She has been in the midst of a divorce , and she is crying frequently which interfere with CPAP use.  She was  married for over 10 years. No children form this marriage, but a 65 year old child who lives far from her .    Observations/Objective:her HST documented a calculated pAHI (per hour) of  21.7/h                          REM pAHI: 50.4/h                                              NREM pAHI:  8.3/h                        Positional AHI:   Sleep was recorded exclusively in supine position.    CPAP compliance :    (7 % by days and 87% by hours, averaging over 6 hours of use per night. 5-15 cm water , 2 cm EPR and residual AHI of 1.1/h. minimal air leak. 95% pressure was  11.5/h.    Observations/Objective:  Generalized: Well developed, in no acute distress  Mentation: Alert oriented to time, place, history taking. Follows all commands speech and language fluent   Assessment and Plan:  58 y.o. year old female  has a past medical history of Abdominal pain, Allergies, Anemia, Anxiety, Arthritis, Cancer (HCC), Complication of anesthesia (1991), Depression, Dyspnea, Family history of breast cancer, Fatty liver, GERD (gastroesophageal reflux disease) (03/01/2017), History of basal cell cancer,  History of hiatal hernia, History of melanoma, History of melanoma, History of recurrent UTIs, Migraine, Multiple nevi, Obstructive sleep apnea, Panic disorder (1995), and Polycystic ovarian disease. here with    ICD-10-CM   1. Migraine with aura and without status migrainosus, not intractable  G43.109 Ubrogepant  (UBRELVY ) 100 MG TABS    2. OSA on CPAP  G47.33 For home use only DME continuous positive airway pressure (CPAP)     Jesenya reports migraines remain well managed on Botox  and Ubrelvy . Will continue current treatment plan. She is doing well on CPAP therapy, however, we do not have download to review. Last documented data in [facility] from 11/2023. She was advised to bring her machine to our office for manual download. She was encouraged to continue close follow up with PCP regarding weight management and Zepbound approval. We will follow up in 1 year.   Orders Placed This Encounter  Procedures   For home use only DME continuous positive airway pressure (CPAP)    Heated Humidity with all supplies as needed    Length of Need:   Lifetime    Patient has OSA or probable OSA:   Yes    Is the patient currently using CPAP in the home:   Yes    Settings:   Other see comments    CPAP supplies needed:   Mask, headgear, cushions, filters, heated tubing and water  chamber    Meds ordered this encounter  Medications   Ubrogepant  (UBRELVY ) 100 MG TABS    Sig: Take 1 tablet (100 mg total) by mouth as needed.    Dispense:  16 tablet    Refill:  11    Supervising Provider:   YAN, YIJUN [phone]    I provided 30 minutes of non-face-to-face time during this encounter. Patient located at their place of residence during Mychart visit. Provider is in the office.    Jonavan Vanhorn, NP

## 2024-08-07 NOTE — Patient Instructions (Addendum)
 Please continue using your CPAP regularly. While your insurance requires that you use CPAP at least 4 hours each night on 70% of the nights, I recommend, that you not skip any nights and use it throughout the night if you can. Getting used to CPAP and staying with the treatment long term does take time and patience and discipline. Untreated obstructive sleep apnea when it is moderate to severe can have an adverse impact on cardiovascular health and raise her risk for heart disease, arrhythmias, hypertension, congestive heart failure, stroke and diabetes. Untreated obstructive sleep apnea causes sleep disruption, nonrestorative sleep, and sleep deprivation. This can have an impact on your day to day functioning and cause daytime sleepiness and impairment of cognitive function, memory loss, mood disturbance, and problems focussing. Using CPAP regularly can improve these symptoms.  Please bring your machine to the office for a manual download. We do not have any data after March 2025. We will update supply orders, today. Continue Botox  and Ubrelvy  as prescribed.   Follow up in 1 year   GENERAL HEADACHE INFORMATION:   Natural supplements: Magnesium Oxide or Magnesium Glycinate 500 mg at bed (up to 800 mg daily) Coenzyme Q10 300 mg in AM Vitamin B2- 200 mg twice a day   Add 1 supplement at a time since even natural supplements can have undesirable side effects. You can sometimes buy supplements cheaper (especially Coenzyme Q10) at www.webmailguide.co.za or at [facility].  Migraine with aura: There is increased risk for stroke in women with migraine with aura and a contraindication for the combined contraceptive pill for use by women who have migraine with aura. The risk for women with migraine without aura is lower. However other risk factors like smoking are far more likely to increase stroke risk than migraine. There is a recommendation for no smoking and for the use of OCPs without estrogen such as progestogen  only pills particularly for women with migraine with aura.SABRA People who have migraine headaches with auras may be 3 times more likely to have a stroke caused by a blood clot, compared to migraine patients who don't see auras. Women who take hormone-replacement therapy may be 30 percent more likely to suffer a clot-based stroke than women not taking medication containing estrogen. Other risk factors like smoking and high blood pressure may be  much more important.    Vitamins and herbs that show potential:   Magnesium: Magnesium (250 mg twice a day or 500 mg at bed) has a relaxant effect on smooth muscles such as blood vessels. Individuals suffering from frequent or daily headache usually have low magnesium levels which can be increase with daily supplementation of 400-750 mg. Three trials found 40-90% average headache reduction  when used as a preventative. Magnesium may help with headaches are aura, the best evidence for magnesium is for migraine with aura is its thought to stop the cortical spreading depression we believe is the pathophysiology of migraine aura.Magnesium also demonstrated the benefit in menstrually related migraine.  Magnesium is part of the messenger system in the serotonin cascade and it is a good muscle relaxant.  It is also useful for constipation which can be a side effect of other medications used to treat migraine. Good sources include nuts, whole grains, and tomatoes. Side Effects: loose stool/diarrhea  Riboflavin (vitamin B 2) 200 mg twice a day. This vitamin assists nerve cells in the production of ATP a principal energy storing molecule.  It is necessary for many chemical reactions in the body.  There have been at least 3 clinical trials of riboflavin using 400 mg per day all of which suggested that migraine frequency can be decreased.  All 3 trials showed significant improvement in over half of migraine sufferers.  The supplement is found in bread, cereal, milk, meat, and poultry.   Most Americans get more riboflavin than the recommended daily allowance, however riboflavin deficiency is not necessary for the supplements to help prevent headache. Side effects: energizing, green urine   Coenzyme Q10: This is present in almost all cells in the body and is critical component for the conversion of energy.  Recent studies have shown that a nutritional supplement of CoQ10 can reduce the frequency of migraine attacks by improving the energy production of cells as with riboflavin.  Doses of 150 mg twice a day have been shown to be effective.   Melatonin: Increasing evidence shows correlation between melatonin secretion and headache conditions.  Melatonin supplementation has decreased headache intensity and duration.  It is widely used as a sleep aid.  Sleep is natures way of dealing with migraine.  A dose of 3 mg is recommended to start for headaches including cluster headache. Higher doses up to 15 mg has been reviewed for use in Cluster headache and have been used. The rationale behind using melatonin for cluster is that many theories regarding the cause of Cluster headache center around the disruption of the normal circadian rhythm in the brain.  This helps restore the normal circadian rhythm.   HEADACHE DIET: Foods and beverages which may trigger migraine Note that only 20% of headache patients are food sensitive. You will know if you are food sensitive if you get a headache consistently 20 minutes to 2 hours after eating a certain food. Only cut out a food if it causes headaches, otherwise you might remove foods you enjoy! What matters most for diet is to eat a well balanced healthy diet full of vegetables and low fat protein, and to not miss meals.   Chocolate, other sweets ALL cheeses except cottage and cream cheese Dairy products, yogurt, sour cream, ice cream Liver Meat extracts (Bovril, Marmite, meat tenderizers) Meats or fish which have undergone aging, fermenting, pickling or  smoking. These include: Hotdogs,salami,Lox,sausage, mortadellas,smoked salmon, pepperoni, Pickled herring Pods of broad bean (English beans, Chinese pea pods, Italian (fava) beans, lima and navy beans Ripe avocado, ripe banana Yeast extracts or active yeast preparations such as Brewer's or Fleishman's (commercial bakes goods are permitted) Tomato based foods, pizza (lasagna, etc.)   MSG (monosodium glutamate) is disguised as many things; look for these common aliases: Monopotassium glutamate Autolysed yeast Hydrolysed protein Sodium caseinate "flavorings" "all natural preservatives Nutrasweet   Avoid all other foods that convincingly provoke headaches.   Resources: The Dizzy Bluford Aid Your Headache Diet, migrainestrong.com  https://zamora-andrews.com/   Caffeine and Migraine For patients that have migraine, caffeine intake more than 3 days per week can lead to dependency and increased migraine frequency. I would recommend cutting back on your caffeine intake as best you can. The recommended amount of caffeine is 200-300 mg daily, although migraine patients may experience dependency at even lower doses. While you may notice an increase in headache temporarily, cutting back will be helpful for headaches in the long run. For more information on caffeine and migraine, visit: https://americanmigrainefoundation.org/resource-library/caffeine-and-migraine/   Headache Prevention Strategies:   1. Maintain a headache diary; learn to identify and avoid triggers.  - This can be a simple note where you log when you had a  headache, associated symptoms, and medications used - There are several smartphone apps developed to help track migraines: Migraine Buddy, Migraine Monitor, Curelator N1-Headache App   Common triggers include: Emotional triggers: Emotional/Upset family or friends Emotional/Upset occupation Business reversal/success Anticipation  anxiety Crisis-serious Post-crisis periodNew job/position   Physical triggers: Vacation Day Weekend Strenuous Exercise High Altitude Location New Move Menstrual Day Physical Illness Oversleep/Not enough sleep Weather changes Light: Photophobia or light sesnitivity treatment involves a balance between desensitization and reduction in overly strong input. Use dark polarized glasses outside, but not inside. Avoid bright or fluorescent light, but do not dim environment to the point that going into a normally lit room hurts. Consider FL-41 tint lenses, which reduce the most irritating wavelengths without blocking too much light.  These can be obtained at axonoptics.com or theraspecs.com Foods: see list above.   2. Limit use of acute treatments (over-the-counter medications, triptans, etc.) to no more than 2 days per week or 10 days per month to prevent medication overuse headache (rebound headache).     3. Follow a regular schedule (including weekends and holidays): Don't skip meals. Eat a balanced diet. 8 hours of sleep nightly. Minimize stress. Exercise 30 minutes per day. Being overweight is associated with a 5 times increased risk of chronic migraine. Keep well hydrated and drink 6-8 glasses of water  per day.   4. Initiate non-pharmacologic measures at the earliest onset of your headache. Rest and quiet environment. Relax and reduce stress. Breathe2Relax is a free app that can instruct you on    some simple relaxtion and breathing techniques. Http://Dawnbuse.com is a    free website that provides teaching videos on relaxation.  Also, there are  many apps that   can be downloaded for "mindful" relaxation.  An app called YOGA NIDRA will help walk you through mindfulness. Another app called Calm can be downloaded to give you a structured mindfulness guide with daily reminders and skill development. Headspace for guided meditation Mindfulness Based Stress Reduction Online Course:  www.palousemindfulness.com Cold compresses.   5. Don't wait!! Take the maximum allowable dosage of prescribed medication at the first sign of migraine.   6. Compliance:  Take prescribed medication regularly as directed and at the first sign of a migraine.   7. Communicate:  Call your physician when problems arise, especially if your headaches change, increase in frequency/severity, or become associated with neurological symptoms (weakness, numbness, slurred speech, etc.). Proceed to emergency room if you experience new or worsening symptoms or symptoms do not resolve, if you have new neurologic symptoms or if headache is severe, or for any concerning symptom.   8. Headache/pain management therapies: Consider various complementary methods, including medication, behavioral therapy, psychological counselling, biofeedback, massage therapy, acupuncture, dry needling, and other modalities.  Such measures may reduce the need for medications. Counseling for pain management, where patients learn to function and ignore/minimize their pain, seems to work very well.   9. Recommend changing family's attention and focus away from patient's headaches. Instead, emphasize daily activities. If first question of day is 'How are your headaches/Do you have a headache today?', then patient will constantly think about headaches, thus making them worse. Goal is to re-direct attention away from headaches, toward daily activities and other distractions.   10. Helpful Websites: www.AmericanHeadacheSociety.org patenthood.ch www.headaches.org tightmarket.nl www.achenet.org

## 2024-08-08 ENCOUNTER — Ambulatory Visit: Admitting: Obstetrics and Gynecology

## 2024-08-10 ENCOUNTER — Encounter (HOSPITAL_COMMUNITY)

## 2024-08-14 ENCOUNTER — Telehealth: Admitting: Family Medicine

## 2024-08-14 ENCOUNTER — Encounter: Payer: Self-pay | Admitting: Family Medicine

## 2024-08-14 DIAGNOSIS — G4733 Obstructive sleep apnea (adult) (pediatric): Secondary | ICD-10-CM | POA: Diagnosis not present

## 2024-08-14 DIAGNOSIS — G43109 Migraine with aura, not intractable, without status migrainosus: Secondary | ICD-10-CM | POA: Diagnosis not present

## 2024-08-14 MED ORDER — UBRELVY 100 MG PO TABS: 1.0000 | ORAL_TABLET | ORAL | 11 refills | Status: AC | PRN

## 2024-08-16 ENCOUNTER — Ambulatory Visit: Admitting: Obstetrics and Gynecology

## 2024-09-12 ENCOUNTER — Other Ambulatory Visit: Payer: Self-pay | Admitting: Gastroenterology

## 2024-09-12 DIAGNOSIS — K219 Gastro-esophageal reflux disease without esophagitis: Secondary | ICD-10-CM

## 2024-09-19 ENCOUNTER — Other Ambulatory Visit: Payer: Self-pay

## 2024-09-25 ENCOUNTER — Other Ambulatory Visit: Payer: Self-pay | Admitting: Gastroenterology

## 2024-09-25 ENCOUNTER — Telehealth: Payer: Self-pay | Admitting: Family Medicine

## 2024-09-25 ENCOUNTER — Telehealth: Payer: Self-pay | Admitting: *Deleted

## 2024-09-25 MED ORDER — POLYETHYLENE GLYCOL 3350 17 GM/SCOOP PO POWD: ORAL | 3 refills | Status: AC

## 2024-09-25 NOTE — Telephone Encounter (Signed)
 Pt called and would like a 90 day prescription  of MiraLax  sent to pharmacy

## 2024-09-25 NOTE — Telephone Encounter (Signed)
 I've faxed this letter and Fiorella up front has as well. Can you try from your fax machine in the pod?

## 2024-09-25 NOTE — Telephone Encounter (Signed)
 Pt called stating that a letter with our office header was suppose to  be faxed tt Pt insurance ,Pt stated that  insurance wanted to know when  Pt was last here so Pt can get get reimbursed for her travels  Fax is suppose to go to  the Attn: [facility]  Fax number [phone]

## 2024-09-26 ENCOUNTER — Telehealth: Payer: Self-pay | Admitting: *Deleted

## 2024-09-26 ENCOUNTER — Other Ambulatory Visit: Payer: Self-pay | Admitting: Gastroenterology

## 2024-09-26 DIAGNOSIS — K219 Gastro-esophageal reflux disease without esophagitis: Secondary | ICD-10-CM

## 2024-09-26 NOTE — Telephone Encounter (Signed)
 Received a refill request for dexlansoprazole  60mg . Pt last OV 08/24/2023.

## 2024-10-02 ENCOUNTER — Encounter: Payer: Self-pay | Admitting: *Deleted

## 2024-10-04 NOTE — Telephone Encounter (Signed)
 Informed patient has been faxed.

## 2024-10-12 ENCOUNTER — Other Ambulatory Visit: Payer: Self-pay | Admitting: Pharmacy Technician

## 2024-10-12 ENCOUNTER — Other Ambulatory Visit: Payer: Self-pay

## 2024-10-12 NOTE — Progress Notes (Signed)
 Specialty Pharmacy Refill Coordination Note  Kimberly Ortega is a 59 y.o. female contacted today regarding refills of specialty medication(s) OnabotulinumtoxinA  (BOTOX )   Patient requested Courier to Provider Office   Delivery date: 10/18/24   Verified address: [facility] [address] [place], [place]   Medication will be filled on: 10/17/24

## 2024-10-17 ENCOUNTER — Other Ambulatory Visit (HOSPITAL_COMMUNITY): Payer: Self-pay

## 2024-10-17 ENCOUNTER — Other Ambulatory Visit: Payer: Self-pay

## 2024-10-24 ENCOUNTER — Ambulatory Visit: Admitting: Family Medicine

## 2024-10-31 ENCOUNTER — Ambulatory Visit: Admitting: Family Medicine

## 2024-11-15 ENCOUNTER — Ambulatory Visit: Admitting: Obstetrics and Gynecology

## 2024-11-16 ENCOUNTER — Ambulatory Visit: Admitting: Gastroenterology

## 2024-12-18 ENCOUNTER — Ambulatory Visit: Admitting: Physician Assistant
